# Patient Record
Sex: Male | Born: 1935 | Race: White | Hispanic: No | Smoking: Former smoker
Health system: Southern US, Community
[De-identification: ages and names within clinical notes are randomized; demographics above are authoritative.]

## PROBLEM LIST (undated history)

## (undated) DIAGNOSIS — I219 Acute myocardial infarction, unspecified: Secondary | ICD-10-CM

## (undated) DIAGNOSIS — M545 Low back pain, unspecified: Secondary | ICD-10-CM

## (undated) DIAGNOSIS — E039 Hypothyroidism, unspecified: Secondary | ICD-10-CM

## (undated) DIAGNOSIS — I509 Heart failure, unspecified: Secondary | ICD-10-CM

## (undated) DIAGNOSIS — K219 Gastro-esophageal reflux disease without esophagitis: Secondary | ICD-10-CM

## (undated) DIAGNOSIS — G4733 Obstructive sleep apnea (adult) (pediatric): Secondary | ICD-10-CM

## (undated) DIAGNOSIS — Z9989 Dependence on other enabling machines and devices: Secondary | ICD-10-CM

## (undated) DIAGNOSIS — I420 Dilated cardiomyopathy: Secondary | ICD-10-CM

## (undated) DIAGNOSIS — Z95810 Presence of automatic (implantable) cardiac defibrillator: Secondary | ICD-10-CM

## (undated) DIAGNOSIS — I472 Ventricular tachycardia, unspecified: Secondary | ICD-10-CM

## (undated) DIAGNOSIS — E785 Hyperlipidemia, unspecified: Secondary | ICD-10-CM

## (undated) DIAGNOSIS — I10 Essential (primary) hypertension: Secondary | ICD-10-CM

## (undated) DIAGNOSIS — Z87442 Personal history of urinary calculi: Secondary | ICD-10-CM

## (undated) DIAGNOSIS — C61 Malignant neoplasm of prostate: Secondary | ICD-10-CM

## (undated) DIAGNOSIS — G8929 Other chronic pain: Secondary | ICD-10-CM

## (undated) DIAGNOSIS — Z8719 Personal history of other diseases of the digestive system: Secondary | ICD-10-CM

## (undated) DIAGNOSIS — J45909 Unspecified asthma, uncomplicated: Secondary | ICD-10-CM

## (undated) DIAGNOSIS — T82110A Breakdown (mechanical) of cardiac electrode, initial encounter: Secondary | ICD-10-CM

## (undated) DIAGNOSIS — Z8711 Personal history of peptic ulcer disease: Secondary | ICD-10-CM

## (undated) DIAGNOSIS — J189 Pneumonia, unspecified organism: Secondary | ICD-10-CM

## (undated) DIAGNOSIS — I4891 Unspecified atrial fibrillation: Secondary | ICD-10-CM

## (undated) DIAGNOSIS — E0590 Thyrotoxicosis, unspecified without thyrotoxic crisis or storm: Secondary | ICD-10-CM

## (undated) DIAGNOSIS — C4490 Unspecified malignant neoplasm of skin, unspecified: Secondary | ICD-10-CM

## (undated) HISTORY — DX: Thyrotoxicosis, unspecified without thyrotoxic crisis or storm: E05.90

## (undated) HISTORY — PX: SKIN CANCER EXCISION: SHX779

## (undated) HISTORY — DX: Pneumonia, unspecified organism: J18.9

## (undated) HISTORY — DX: Essential (primary) hypertension: I10

## (undated) HISTORY — DX: Unspecified atrial fibrillation: I48.91

## (undated) HISTORY — DX: Ventricular tachycardia, unspecified: I47.20

## (undated) HISTORY — DX: Breakdown (mechanical) of cardiac electrode, initial encounter: T82.110A

## (undated) HISTORY — DX: Hyperlipidemia, unspecified: E78.5

## (undated) HISTORY — DX: Gastro-esophageal reflux disease without esophagitis: K21.9

## (undated) HISTORY — PX: PROSTATECTOMY: SHX69

## (undated) HISTORY — DX: Ventricular tachycardia: I47.2

## (undated) HISTORY — PX: APPENDECTOMY: SHX54

## (undated) HISTORY — PX: INGUINAL HERNIA REPAIR: SUR1180

## (undated) HISTORY — DX: Dilated cardiomyopathy: I42.0

---

## 2001-05-08 ENCOUNTER — Ambulatory Visit (HOSPITAL_COMMUNITY): Admission: RE | Admit: 2001-05-08 | Discharge: 2001-05-08 | Payer: Self-pay | Admitting: Internal Medicine

## 2001-05-08 ENCOUNTER — Encounter: Payer: Self-pay | Admitting: Internal Medicine

## 2007-07-18 HISTORY — PX: CARDIAC DEFIBRILLATOR PLACEMENT: SHX171

## 2010-06-29 ENCOUNTER — Encounter: Payer: Self-pay | Admitting: Internal Medicine

## 2010-07-07 ENCOUNTER — Encounter: Payer: Self-pay | Admitting: Critical Care Medicine

## 2010-10-29 DIAGNOSIS — Z95810 Presence of automatic (implantable) cardiac defibrillator: Secondary | ICD-10-CM

## 2010-10-29 HISTORY — DX: Presence of automatic (implantable) cardiac defibrillator: Z95.810

## 2010-12-21 DIAGNOSIS — E785 Hyperlipidemia, unspecified: Secondary | ICD-10-CM | POA: Insufficient documentation

## 2010-12-21 DIAGNOSIS — I10 Essential (primary) hypertension: Secondary | ICD-10-CM | POA: Insufficient documentation

## 2010-12-21 DIAGNOSIS — K219 Gastro-esophageal reflux disease without esophagitis: Secondary | ICD-10-CM | POA: Insufficient documentation

## 2010-12-21 DIAGNOSIS — J189 Pneumonia, unspecified organism: Secondary | ICD-10-CM | POA: Insufficient documentation

## 2010-12-22 ENCOUNTER — Ambulatory Visit
Admission: RE | Admit: 2010-12-22 | Discharge: 2010-12-22 | Payer: Self-pay | Source: Home / Self Care | Attending: Critical Care Medicine | Admitting: Critical Care Medicine

## 2010-12-22 ENCOUNTER — Telehealth: Payer: Self-pay | Admitting: Critical Care Medicine

## 2010-12-22 ENCOUNTER — Encounter: Payer: Self-pay | Admitting: Critical Care Medicine

## 2010-12-22 ENCOUNTER — Other Ambulatory Visit: Payer: Self-pay | Admitting: Critical Care Medicine

## 2010-12-22 DIAGNOSIS — G4733 Obstructive sleep apnea (adult) (pediatric): Secondary | ICD-10-CM | POA: Insufficient documentation

## 2010-12-22 DIAGNOSIS — K222 Esophageal obstruction: Secondary | ICD-10-CM | POA: Insufficient documentation

## 2010-12-22 DIAGNOSIS — I4891 Unspecified atrial fibrillation: Secondary | ICD-10-CM | POA: Insufficient documentation

## 2010-12-22 DIAGNOSIS — J441 Chronic obstructive pulmonary disease with (acute) exacerbation: Secondary | ICD-10-CM | POA: Insufficient documentation

## 2010-12-22 DIAGNOSIS — E0590 Thyrotoxicosis, unspecified without thyrotoxic crisis or storm: Secondary | ICD-10-CM | POA: Insufficient documentation

## 2010-12-22 DIAGNOSIS — Z8546 Personal history of malignant neoplasm of prostate: Secondary | ICD-10-CM | POA: Insufficient documentation

## 2010-12-22 LAB — CONVERTED CEMR LAB: IgE (Immunoglobulin E), Serum: 14 intl units/mL (ref 0.0–180.0)

## 2010-12-23 ENCOUNTER — Telehealth: Payer: Self-pay | Admitting: Critical Care Medicine

## 2010-12-23 LAB — T3, FREE: T3, Free: 2.7 pg/mL (ref 2.3–4.2)

## 2010-12-23 LAB — TSH: TSH: 1.38 u[IU]/mL (ref 0.35–5.50)

## 2010-12-23 LAB — T4, FREE: Free T4: 0.73 ng/dL (ref 0.60–1.60)

## 2010-12-24 ENCOUNTER — Telehealth: Payer: Self-pay | Admitting: Critical Care Medicine

## 2010-12-24 LAB — CONVERTED CEMR LAB: IgE (Immunoglobulin E), Serum: 12.5 intl units/mL (ref 0.0–180.0)

## 2011-01-14 NOTE — Progress Notes (Signed)
Summary: CXR  Phone Note Outgoing Call   Reason for Call: Discuss lab or test results Summary of Call: call pt and tell him CXR does not show pneumonia.  no active lung disease, no emphysema Initial call taken by: Storm Frisk MD,  December 22, 2010 1:55 PM  Follow-up for Phone Call        [facility] Crystal Yetta Barre RN  December 22, 2010 3:11 PM  pt returned crystal's call. Pt is aware of cxr and verbalized understanding and had no questions Carver Fila  December 22, 2010 4:36 PM

## 2011-01-14 NOTE — Progress Notes (Signed)
Summary: Food allergy panel negative  Phone Note Outgoing Call   Reason for Call: Discuss lab or test results Summary of Call: call pt and tell him his food allergy panel is negative, no food allergies detected Initial call taken by: Storm Frisk MD,  December 24, 2010 1:42 PM  Follow-up for Phone Call        called, spoke with pt.  He was informed of above results per PW and verbalized understanding Follow-up by: Gweneth Dimitri RN,  December 24, 2010 3:35 PM

## 2011-01-14 NOTE — Assessment & Plan Note (Signed)
Summary: Pulmonary Consultation   Copy to:  Dr. Gillis Ends Primary Provider/Referring Provider:  Dr. Desmond Dike  CC:  Pulmonary Consult - DOE/asthma..  History of Present Illness: Pulmonary Consultation This pt has had chronic dyspnea for years.  Noted Asthma dx at age 77. Pt has chronic cough prod white mucus.  Pt with dyspnea at exertion and at rest.    Asthma triggers:  smells from stores, leather, fumes, strong odors, certain foods:  Pt with freq sinus infection,  saw ENT 3months.  no xrays done.  has a goiter on R neck. thyroid R sided. Pt notesocc dysphagia,  chokes on water, had a swallow eval? results? done a [place] hosp. MBS three months ago>>>results show distal esophageal stricture with reflux. Pt never tested for allergies Pt with dx osa on cpap nightly,  setting 10cmh20 nasal mask Pt uses albuterol 1-2 times per day hfa Pt will use albuterol neb 2-3 times day Pt has atrial fibrillation. Dr  Dulce Sellar is cardiology, pt is on metoprolol 200mg  bid l for rate control          This is a 77 year old male who presents with asthma.  The patient complains of history of diagnosed Asthma, cough, shortness of breath, wheezing, mucous production, nocturnal awakening, exercise induced symptoms, and congestion, but denies chest tightness and chest pain.  Symptoms appear triggered by stress, cold exposure, exercise, URI, sinusitis, and irritant:.  The patient also has the following associated problems: chronic sinus disease, indigestion, difficulty swallowing, hoarseness, sneezing, nasal congestion, difficulty breathing through nose, productive cough, non-productive cough, and chest tightness.  Previous effective treatment includes short acting beta-agonist:.  Asthma monitoring and treatment delivery to date has included: use of home nebulizer and no PF meter.    Asthma History    Initial Asthma Severity Rating:    Age range: 12+ years    Symptoms: throughout the day    Nighttime  Awakenings: 3-4/month    Interferes w/ normal activity: some limitations    SABA use (not for EIB): several times per day    Exacerbations requiring oral systemic steroids: 2 or more/year    Asthma Severity Assessment: Severe Persistent   Preventive Screening-Counseling & Management  Alcohol-Tobacco     Smoking Status: quit > 6 months     Year Quit: 1984     Pack years: 40  Current Medications (verified): 1)  Coumadin 5 Mg Tabs (Warfarin Sodium) .... As Directed 2)  Digoxin 0.125 Mg Tabs (Digoxin) .... Take 1 Tablet By Mouth Once A Day 3)  Furosemide 40 Mg Tabs (Furosemide) .... Take 1 Tablet By Mouth Once A Day 4)  Klor-Con M20 20 Meq Cr-Tabs (Potassium Chloride Crys Cr) .... Take 1 Tablet By Mouth Two Times A Day 5)  Methimazole 10 Mg Tabs (Methimazole) .... 2 Tablets Once Daily 6)  Metoprolol Tartrate 100 Mg Tabs (Metoprolol Tartrate) .... Take 2 Tablets By Mouth in Am and 2 Tablets By Mouth in Pm 7)  Pravastatin Sodium 40 Mg Tabs (Pravastatin Sodium) .... Take 1 Tablet By Mouth Once A Day 8)  Zolpidem Tartrate 10 Mg Tabs (Zolpidem Tartrate) .... Take 1 Tab By Mouth At Bedtime 9)  Fish Oil 1200 Mg Caps (Omega-3 Fatty Acids) .... Take 1 Capsule By Mouth At Bedtime 10)  Spironolactone 25 Mg Tabs (Spironolactone) .... Take 1 Tablet By Mouth Once A Day 11)  Proair Hfa 108 (90 Base) Mcg/act Aers (Albuterol Sulfate) .... 2 Puffs Every 4 Hours As Needed 12)  Albuterol Sulfate (  2.5 Mg/74ml) 0.083% Nebu (Albuterol Sulfate) .... As Needed 13)  Cpap .... At Bedtime  Allergies (verified): 1)  ! Pcn 2)  ! * Dobutamine  Past History:  Past medical, surgical, family and social histories (including risk factors) reviewed, and no changes noted (except as noted below).  Past Medical History: PROSTATE CANCER, HX OF (ICD-V10.46) SLEEP APNEA, OBSTRUCTIVE (ICD-327.23)    -Cpap 10cmh20 PNEUMONIA (ICD-486)    10/29/10  Rocephin and levaquin     -no CXR since GERD (ICD-530.81) HYPERLIPIDEMIA  (ICD-272.4) HYPERTENSION (ICD-401.9) Atrial fibrillation    -chronic coumadin  Past Surgical History: Prostatectomy Pacemaker      Family History: Reviewed history and no changes required. mother - heart disease father - bladder cancer  Social History: Reviewed history and no changes required. Former Smoker.  Quit in 1984.  Started at age 46.  Up to 2ppd when he quit.  Occas chewing tobacco.  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  The patient denies shortness of breath at rest, non-productive cough, coughing up blood, chest pain, acid heartburn, indigestion, loss of appetite, weight change, abdominal pain, sore throat, tooth/dental problems, nasal congestion/difficulty breathing through nose, itching, ear ache, anxiety, depression, hand/feet swelling, joint stiffness or pain, rash, change in color of mucus, and fever.        See HPI for Pulmonary, General, ENT, and Cardiac review of systems.  Vital Signs:  Patient profile:   77 year old male Height:      68 inches Weight:      243.25 pounds BMI:     37.12 O2 Sat:      95 % on Room air Temp:     97.8 degrees F oral Pulse rate:   71 / minute BP sitting:   122 / 72  (left arm) Cuff size:   large  Vitals Entered By: Gweneth Dimitri RN (December 22, 2010 11:08 AM)  O2 Flow:  Room air  Serial Vital Signs/Assessments:  Comments: 11:39 AM Ambulatory Pulse Oximetry  Resting; HR__70___    02 Sat__95% RA___  Lap1 (185 feet)   HR__95   02 Sat__95% RA___ Lap2 (185 feet)   HR__98___   02 Sat_94% RA____    Lap3 (185 feet)   HR__100___   02 Sat__96% RA___  _x__Test Completed without Difficulty ___Test Stopped due to:  Gweneth Dimitri RN  December 22, 2010 11:39 AM  By: Gweneth Dimitri RN   CC: Pulmonary Consult  - DOE/asthma. Comments Medications reviewed with patient Daytime contact number verified with patient. Crystal Jones RN  December 22, 2010 11:08 AM    Physical Exam  Additional Exam:  Gen: Pleasant, well-nourished, in no distress,  normal affect ENT: No lesions,  mouth clear,  oropharynx clear, no postnasal drip Neck: No JVD, no TMG, no carotid bruits Lungs: No use of accessory muscles, no dullness to percussion, distant BS Cardiovascular: RRR, heart sounds normal, no murmur or gallops, no peripheral edema Abdomen: soft and NT, no HSM,  BS normal Musculoskeletal: No deformities, no cyanosis or clubbing Neuro: alert, non focal Skin: Warm, no lesions or rashes    CXR  Procedure date:  12/22/2010  Findings:      Findings: There is a left chest wall pacer device with leads in the right atrial appendage and right  ventricle.   The heart size appears mildly enlarged.   No pleural effusions or edema noted.  There is pulmonary venous congestion present.   Review of the visualized osseous structures is unremarkable.   IMPRESSION: Cardiac enlargement and pulmonary venous congestion.     Impression & Recommendations:  Problem # 1:  EXTRINSIC ASTHMA, UNSPECIFIED (ICD-493.00) Assessment Deteriorated Severe persistent asthma with moderate obstruction due to atopy, reflux, esophageal stricture, high dose beta blocker use , severe airflow obstruction on spiro plan  Prednisone 10mg  Take 4 daily for two days, then 3 daily for two days, then two daily for two days then one daily for two days then stop Reduce metoprolol to 100mg  two times a day  (1 twice daily) Labs today Try to reduce albuterol in nebulizer and inhaler to as needed Labs: check RAST assay An overnight sleep oximetry on CPAP will be obtained cont advair  Chest xray today>>>no active disease Full pulmonary functions on return A GI referral will be made for esophageal stricture Return 1 month   Medications Added to  Medication List This Visit: 1)  Furosemide 40 Mg Tabs (Furosemide) .... Take 1 tablet by mouth once a day 2)  Klor-con M20 20 Meq Cr-tabs (Potassium chloride crys cr) .... Take 1 tablet by mouth two times a day 3)  Methimazole 10 Mg Tabs (Methimazole) .... 2 tablets once daily 4)  Metoprolol Tartrate 100 Mg Tabs (Metoprolol tartrate) .... Take 2 tablets by mouth in am and 2 tablets by mouth in pm 5)  Metoprolol Tartrate 100 Mg Tabs (Metoprolol tartrate) .... Take 1  tablets by mouth in am and 1tablets by mouth in pm 6)  Zolpidem Tartrate 10 Mg Tabs (Zolpidem tartrate) .... Take 1 tab by mouth at bedtime 7)  Fish Oil 1200 Mg Caps (Omega-3 fatty acids) .... Take 1 capsule by mouth at bedtime 8)  Fish Oil 1200 Mg Caps (Omega-3 fatty acids) .... Hold 9)  Spironolactone 25 Mg Tabs (Spironolactone) .... Take 1 tablet by mouth once a day 10)  Proair Hfa 108 (90 Base) Mcg/act Aers (Albuterol sulfate) .... 2 puffs every 4 hours as needed 11)  Albuterol Sulfate (2.5 Mg/52ml) 0.083% Nebu (Albuterol sulfate) .... As needed 12)  Cpap  .... At bedtime  10cmh20 13)  Cpap  .... At bedtime 14)  Advair Diskus 250-50 Mcg/dose Misc (Fluticasone-salmeterol) .... One puff twice daily 15)  Prednisone 10 Mg Tabs (Prednisone) .... Take as directed take 4 daily for two days, then 3 daily for two days, then two daily for two days then one daily for two days then stop  Complete Medication List: 1)  Coumadin 5 Mg Tabs (Warfarin sodium) .... As directed 2)  Digoxin 0.125 Mg Tabs (Digoxin) .... Take 1 tablet by mouth once a day 3)  Furosemide 40 Mg Tabs (Furosemide) .... Take 1 tablet by mouth once a day 4)  Klor-con M20 20 Meq Cr-tabs (Potassium chloride crys cr) .... Take 1 tablet by mouth two times a day 5)  Methimazole 10 Mg Tabs (Methimazole) .... 2 tablets once daily 6)  Metoprolol Tartrate 100 Mg Tabs (Metoprolol tartrate) .... Take 1  tablets by mouth in am and 1tablets by mouth in pm 7)  Pravastatin Sodium 40 Mg  Tabs (Pravastatin sodium) .... Take 1 tablet by mouth once a day 8)  Zolpidem Tartrate 10 Mg Tabs (Zolpidem tartrate) .... Take 1 tab by mouth at bedtime 9)  Fish Oil 1200 Mg Caps (Omega-3 fatty acids) .... Hold 10)  Spironolactone  25 Mg Tabs (Spironolactone) .... Take 1 tablet by mouth once a day 11)  Proair Hfa 108 (90 Base) Mcg/act Aers (Albuterol sulfate) .... 2 puffs every 4 hours as needed 12)  Albuterol Sulfate (2.5 Mg/41ml) 0.083% Nebu (Albuterol sulfate) .... As needed 13)  Cpap  .... At bedtime  10cmh20 14)  Advair Diskus 250-50 Mcg/dose Misc (Fluticasone-salmeterol) .... One puff twice daily 15)  Prednisone 10 Mg Tabs (Prednisone) .... Take as directed take 4 daily for two days, then 3 daily for two days, then two daily for two days then one daily for two days then stop  Other Orders: New Patient Level V (69629) Pulse Oximetry, Ambulatory (52841) Spirometry w/Graph (94010) T-"RAST" (Allergy Full Profile) IGE (32440-10272) T-Food Allergy Profile Specific IgE (86003/[address]) DME Referral (DME) Pulmonary Referral (Pulmonary) T-2 View CXR (71020TC) Gastroenterology Referral (GI) TLB-TSH (Thyroid Stimulating Hormone) (84443-TSH) TLB-T4 (Thyrox), Free [phone]) TLB-T3, Free (Triiodothyronine) (84481-T3FREE)  Patient Instructions: 1)  Prednisone 10mg  Take 4 daily for two days, then 3 daily for two days, then two daily for two days then one daily for two days then stop 2)  Reduce metoprolol to 100mg  two times a day  (1 twice daily) 3)  Labs today 4)  Try to reduce albuterol in nebulizer and inhaler to as needed 5)  Labs today 6)  An overnight sleep oximetry on CPAP will be obtained 7)  Chest xray today 8)  Full pulmonary functions on return 9)  A GI referral will be made for esophageal stricture 10)  Return 1 month  Prescriptions: PREDNISONE 10 MG  TABS (PREDNISONE) Take as directed Take 4 daily for two days, then 3 daily for two days, then two daily for two days then one  daily for two days then stop  #20 x 0   Entered and Authorized by:   Storm Frisk MD   Signed by:   Storm Frisk MD on 12/22/2010   Method used:   Electronically to        [facility]  [address] [id number] (retail)       [address]       [place], [place]  [address]       Ph: [phone] or [phone]       Fax: [phone]   RxID:   [id number] ADVAIR DISKUS 250-50 MCG/DOSE  MISC (FLUTICASONE-SALMETEROL) One puff twice daily  #1 x 6   Entered and Authorized by:   Storm Frisk MD   Signed by:   Storm Frisk MD on 12/22/2010   Method used:   Electronically to        [facility]  [address] [id number] (retail)       [address]       [place], [place]  [address]       Ph: [phone] or [phone]       Fax: [phone]   RxID:   [id number]    Immunization History:  Influenza Immunization History:    Influenza:  historical (09/12/2010)   Appended Document: Pulmonary Consultation fax Desmond Dike

## 2011-01-14 NOTE — Progress Notes (Signed)
Summary: Thyroid labs normal  Phone Note Outgoing Call   Reason for Call: Discuss lab or test results Summary of Call: call pt and tell him his thyroid function labs were normal Initial call taken by: Storm Frisk MD,  December 23, 2010 3:47 PM  Follow-up for Phone Call        done  Follow-up by: Storm Frisk MD,  December 24, 2010 9:39 AM

## 2011-01-21 ENCOUNTER — Encounter: Payer: Self-pay | Admitting: Critical Care Medicine

## 2011-01-26 ENCOUNTER — Encounter: Payer: Self-pay | Admitting: Critical Care Medicine

## 2011-01-26 ENCOUNTER — Telehealth: Payer: Self-pay | Admitting: Critical Care Medicine

## 2011-01-26 ENCOUNTER — Ambulatory Visit (INDEPENDENT_AMBULATORY_CARE_PROVIDER_SITE_OTHER): Payer: Medicare Other | Admitting: Critical Care Medicine

## 2011-01-26 DIAGNOSIS — J449 Chronic obstructive pulmonary disease, unspecified: Secondary | ICD-10-CM

## 2011-02-03 NOTE — Assessment & Plan Note (Signed)
Summary: Pulmonary OV   Copy to:  Dr. Gillis Ends Primary Provider/Referring Provider:  Dr. Desmond Dike  CC:  1 month follow up.  Pt states he does have SOB with exertion but overall breathing has improved.  Prod cough with white mucus.  Denies wheezing and chest tightness.Cody Gomez  History of Present Illness: Pulmonary Consultation This pt has had chronic dyspnea for years.  Noted Asthma dx at age 77. Pt has chronic cough prod white mucus.  Pt with dyspnea at exertion and at rest.    Asthma triggers:  smells from stores, leather, fumes, strong odors, certain foods:  Pt with freq sinus infection,  saw ENT 3months.  no xrays done.  has a goiter on R neck. thyroid R sided. Pt notesocc dysphagia,  chokes on water, had a swallow eval? results? done a [place] hosp. MBS three months ago>>>results show distal esophageal stricture with reflux. Pt never tested for allergies Pt with dx osa on cpap nightly,  setting 10cmh20 nasal mask Pt uses albuterol 1-2 times per day hfa Pt will use albuterol neb 2-3 times day Pt has atrial fibrillation. Dr  Dulce Sellar is cardiology, pt is on metoprolol 200mg  bid l for rate control          This is a 77 year old male who presents with asthma.  The patient complains of history of diagnosed Asthma, cough, shortness of breath, wheezing, mucous production, nocturnal awakening, exercise induced symptoms, and congestion, but denies chest tightness and chest pain.  Symptoms appear triggered by stress, cold exposure, exercise, URI, sinusitis, and irritant:.  The patient also has the following associated problems: chronic sinus disease, indigestion, difficulty swallowing, hoarseness, sneezing, nasal congestion, difficulty breathing through nose, productive cough, non-productive cough, and chest tightness.  Previous effective treatment includes short acting beta-agonist:.  Asthma monitoring and treatment delivery to date has included: use of home nebulizer and no PF meter.  January 26, 2011 4:55 PM Since last ov pt is improved with less dyspnea.  Pt has esoph stricture due to be stretched in [place] soon.   Pt had a cpap titration study, results pending in [facility] lab.  ONO on RA wiht cpap did not demonstrate any oxygen need. No new issues. Pt denies any significant sore throat, nasal congestion or excess secretions, fever, chills, sweats, unintended weight loss, pleurtic or exertional chest pain, orthopnea PND, or leg swelling Pt denies any increase in rescue therapy over baseline, denies waking up needing it or having any early am or nocturnal exacerbations of coughing/wheezing/or dyspnea.   Current Medications (verified): 1)  Coumadin 5 Mg Tabs (Warfarin Sodium) .... As Directed 2)  Digoxin 0.125 Mg Tabs (Digoxin) .... Take 1 Tablet By Mouth Once A Day 3)  Furosemide 40 Mg Tabs (Furosemide) .... Take 1 Tablet By Mouth Once A Day 4)  Klor-Con M20 20 Meq Cr-Tabs (Potassium Chloride Crys Cr) .... Take 1 Tablet By Mouth Two Times A Day 5)  Methimazole 10 Mg Tabs (Methimazole) .... 2 Tablets Once Daily 6)  Metoprolol Tartrate 100 Mg Tabs (Metoprolol Tartrate) .... Take 1  Tablets By Mouth in Am and 1tablets By Mouth in Pm 7)  Pravastatin Sodium 40 Mg Tabs (Pravastatin Sodium) .... Take 1 Tablet By Mouth Once A Day 8)  Zolpidem Tartrate 10 Mg Tabs (Zolpidem Tartrate) .... Take 1 Tab By Mouth At Bedtime 9)  Fish Oil 1200 Mg Caps (Omega-3 Fatty Acids) .... Hold 10)  Spironolactone 25 Mg Tabs (Spironolactone) .... Take 1 Tablet By Mouth Once A  Day 11)  Proair Hfa 108 (90 Base) Mcg/act Aers (Albuterol Sulfate) .... 2 Puffs Every 4 Hours As Needed 12)  Albuterol Sulfate (2.5 Mg/31ml) 0.083% Nebu (Albuterol Sulfate) .... As Needed 13)  Cpap .... At Bedtime  10cmh20 14)  Advair Diskus 250-50 Mcg/dose  Misc (Fluticasone-Salmeterol) .... One Puff Twice Daily  Allergies (verified): 1)  ! Pcn 2)  ! * Dobutamine  Past History:  Past medical, surgical, family and social histories  (including risk factors) reviewed, and no changes noted (except as noted below).  Past Medical History: Reviewed history from 12/22/2010 and no changes required. PROSTATE CANCER, HX OF (ICD-V10.46) SLEEP APNEA, OBSTRUCTIVE (ICD-327.23)    -Cpap 10cmh20 PNEUMONIA (ICD-486)    10/29/10  Rocephin and levaquin     -no CXR since GERD (ICD-530.81) HYPERLIPIDEMIA (ICD-272.4) HYPERTENSION (ICD-401.9) Atrial fibrillation    -chronic coumadin  Past Surgical History: Reviewed history from 12/22/2010 and no changes required. Prostatectomy Pacemaker      Family History: Reviewed history from 12/22/2010 and no changes required. mother - heart disease father - bladder cancer  Social History: Reviewed history from 12/22/2010 and no changes required. Former Smoker.  Quit in 1984.  Started at age 75.  Up to 2ppd when he quit.  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  The patient denies shortness of breath at rest, productive cough, non-productive cough, coughing up blood, chest pain, irregular heartbeats, acid heartburn, indigestion, loss of appetite, weight change, abdominal pain, difficulty swallowing, sore throat, tooth/dental problems, headaches, nasal congestion/difficulty breathing through nose, sneezing, itching, ear ache, anxiety, depression, hand/feet swelling, joint stiffness or pain, rash, change in color of mucus, and fever.    Vital Signs:  Patient profile:   77 year old male Height:      67 inches Weight:      236.50 pounds BMI:     37.18 O2 Sat:      95 % on Room air Temp:     97.9 degrees F oral Pulse rate:   75 / minute BP sitting:   122 / 78  (right arm) Cuff size:   large  Vitals Entered By: Gweneth Dimitri RN (January 26, 2011 4:41 PM)  O2 Flow:  Room air CC: 1 month follow up.  Pt states he does have SOB with exertion but overall breathing has improved.  Prod  cough with white mucus.  Denies wheezing and chest tightness. Comments Medications reviewed with patient Daytime contact number verified with patient. Gweneth Dimitri RN  January 26, 2011 4:43 PM    Physical Exam  Additional Exam:  Gen: Pleasant, well-nourished, in no distress,  normal affect ENT: No lesions,  mouth clear,  oropharynx clear, no postnasal drip Neck: No JVD, no TMG, no carotid bruits Lungs: No use of accessory muscles, no dullness to percussion, distant BS Cardiovascular: RRR, heart sounds normal, no murmur or gallops, no peripheral edema Abdomen: soft and NT, no HSM,  BS normal Musculoskeletal: No deformities, no cyanosis or clubbing Neuro: alert, non focal Skin: Warm, no lesions or rashes  The following is not an MRI, but is a CXR exam :  Sleep Study  Procedure date:  01/21/2011  Findings:      Low Oxygen Sat: 85%  Oximetry overnight on   RA plus cpap               =  min  time with sats less than 89%   MRI EXAM  Procedure date:  12/22/2010  Findings:      IMPRESSION: Cardiac enlargement and pulmonary venous congestion.    Impression & Recommendations:  Problem # 1:  COPD (ICD-496) Assessment Improved Stable copd plan NO oxygen needed with cpap Stay on advair No further prednisone needed Return 4 months  Problem # 2:  SLEEP APNEA, OBSTRUCTIVE (ICD-327.23) Assessment: Unchanged f/u recent cpap titration study from [place] note recent ONO on RA plus cpap showed no need for oxygen supp to cpap  Complete Medication List: 1)  Coumadin 5 Mg Tabs (Warfarin sodium) .... As directed 2)  Digoxin 0.125 Mg Tabs (Digoxin) .... Take 1 tablet by mouth once a day 3)  Furosemide 40 Mg Tabs (Furosemide) .... Take 1 tablet by mouth once a day 4)  Klor-con M20 20 Meq Cr-tabs (Potassium chloride crys cr) .... Take 1 tablet by mouth two times a day 5)  Methimazole 10 Mg Tabs (Methimazole) .... 2 tablets once daily 6)  Metoprolol Tartrate 100 Mg Tabs (Metoprolol  tartrate) .... Take 1  tablets by mouth in am and 1tablets by mouth in pm 7)  Pravastatin Sodium 40 Mg Tabs (Pravastatin sodium) .... Take 1 tablet by mouth once a day 8)  Zolpidem Tartrate 10 Mg Tabs (Zolpidem tartrate) .... Take 1 tab by mouth at bedtime 9)  Fish Oil 1200 Mg Caps (Omega-3 fatty acids) .... Hold 10)  Spironolactone 25 Mg Tabs (Spironolactone) .... Take 1 tablet by mouth once a day 11)  Proair Hfa 108 (90 Base) Mcg/act Aers (Albuterol sulfate) .... 2 puffs every 4 hours as needed 12)  Albuterol Sulfate (2.5 Mg/39ml) 0.083% Nebu (Albuterol sulfate) .... As needed 13)  Cpap  .... At bedtime  10cmh20 14)  Advair Diskus 250-50 Mcg/dose Misc (Fluticasone-salmeterol) .... One puff twice daily  Other Orders: Est. Patient Level III ([address])  Clinical Reports Reviewed:  PFT's:  12/22/2010: FEF 25/75 %Predicted:  54.386 FEV1 %Predicted:  66.677 FEV1/FVC %Predicted:  92.141 FVC %Predicted:  71.790   Patient Instructions: 1)  NO oxygen needed with cpap 2)  Stay on advair 3)  No further prednisone needed 4)  Return 4 months 5)  We will review results from [facility] lab 6)  Return 1 month     Appended Document: Pulmonary OV fax Desmond Dike

## 2011-02-03 NOTE — Miscellaneous (Signed)
Summary: ONO plus cpap  Clinical Lists Changes

## 2011-02-03 NOTE — Progress Notes (Signed)
Summary: ONO results  Phone Note Outgoing Call   Reason for Call: Discuss lab or test results Summary of Call: call pt and tell him overnight sleep oximetry indicates he does NOT need oxygen with his cpap machine Initial call taken by: Storm Frisk MD,  January 26, 2011 2:26 PM  Follow-up for Phone Call        Called, spoke with pt's wife.  Pt unavailable at this time but she will have pt return call. Gweneth Dimitri RN  January 26, 2011 3:08 PM  Pt returned call.  He was informed of above ONO results and recs per PW.  He verbalized understanding of this. Follow-up by: Gweneth Dimitri RN,  January 26, 2011 3:20 PM

## 2011-02-03 NOTE — Procedures (Signed)
Summary: Pulse Oximetry/IDS  Pulse Oximetry/IDS   Imported By: Sherian Rein 01/29/2011 08:34:16  _____________________________________________________________________  External Attachment:    Type:   Image     Comment:   External Document

## 2011-06-23 ENCOUNTER — Encounter: Payer: Self-pay | Admitting: Internal Medicine

## 2011-06-24 ENCOUNTER — Ambulatory Visit (INDEPENDENT_AMBULATORY_CARE_PROVIDER_SITE_OTHER): Payer: Medicare Other | Admitting: Internal Medicine

## 2011-06-24 ENCOUNTER — Encounter (INDEPENDENT_AMBULATORY_CARE_PROVIDER_SITE_OTHER): Payer: Medicare Other | Admitting: *Deleted

## 2011-06-24 ENCOUNTER — Other Ambulatory Visit: Payer: Self-pay | Admitting: Internal Medicine

## 2011-06-24 ENCOUNTER — Encounter: Payer: Self-pay | Admitting: Internal Medicine

## 2011-06-24 VITALS — BP 110/74 | HR 78 | Ht 68.0 in | Wt 203.0 lb

## 2011-06-24 DIAGNOSIS — I472 Ventricular tachycardia: Secondary | ICD-10-CM

## 2011-06-24 DIAGNOSIS — Z95810 Presence of automatic (implantable) cardiac defibrillator: Secondary | ICD-10-CM

## 2011-06-24 DIAGNOSIS — I428 Other cardiomyopathies: Secondary | ICD-10-CM

## 2011-06-24 DIAGNOSIS — I4891 Unspecified atrial fibrillation: Secondary | ICD-10-CM

## 2011-06-24 NOTE — Patient Instructions (Addendum)
Lab today-BMP/Liver profile/CBC/ Magnesium level 427.31  Call next week to let us know what day you want to be admitted for Tikosyn. We need  2 days notice to schedule this admission.  Call Weston Brass, pharmacist  [phone].   Follow-up with Dr Graciela Husbands will be scheduled when you are discharged from the hospital.

## 2011-06-24 NOTE — Progress Notes (Signed)
HPI: Cody Gomez is a 77 y.o. male Is seen at the request of Dr. Sheria Lang her second opinions regarding management of arrhythmia.  The patient does not understand why he has a defibrillator implanted. This was done in 2009 or so. He understands that was done for atrial fibrillation. He appreciates that he has no ischemic heart disease.  Because of arrhythmia issues he was treated with amiodarone that were discontinued because of thyroid issues. A note dated June 2012 says that he was on dronaderone at that time. With review of the pharmacy this prescription has not been refilled since October. Misty Stanley one occasion it sounds like he was cardioverted via his ICD as he recalls being shocked while he was awake.    The patient has had multiple ICD discharges. Most recent assessment of left ventricular function with an echo in May 2012 demonstrated ejection fraction of 45% with left ventricular enlargement.  He is functionally quite limited. He has class 2-3 symptoms. He does not have edema however orthopnea or nocturnal dyspnea. He has not had syncope or palpitations  He has been followed by [facility]. We have a note from May 2012 outlining that he was to be sent to.. When this happened he was referred for catheter ablation. It was at this point that he began to have questions regarding his care and sought other input.  Current Outpatient Prescriptions  Medication Sig Dispense Refill  . albuterol (PROAIR HFA) 108 (90 BASE) MCG/ACT inhaler Inhale 2 puffs into the lungs every 4 (four) hours as needed.        Marland Kitchen albuterol (PROVENTIL) (2.5 MG/3ML) 0.083% nebulizer solution Take 2.5 mg by nebulization every 6 (six) hours as needed.        . digoxin (LANOXIN) 0.125 MG tablet Take 125 mcg by mouth daily.        . Fluticasone-Salmeterol (ADVAIR DISKUS) 250-50 MCG/DOSE AEPB Inhale 1 puff into the lungs 2 (two) times daily.        . furosemide (LASIX) 40 MG tablet Take 40 mg by mouth daily.        .  Magnesium Chloride (SLOW-MAG PO) Take 71.5 mg by mouth 2 (two) times daily.        . methimazole (TAPAZOLE) 10 MG tablet Take 10 mg by mouth daily.        . metoprolol (LOPRESSOR) 100 MG tablet Take 100 mg by mouth 2 (two) times daily.       . NON FORMULARY CPAP - at bedtime 10cmh20       . omeprazole (PRILOSEC) 20 MG capsule Take 20 mg by mouth daily.        . potassium chloride SA (K-DUR,KLOR-CON) 20 MEQ tablet Take 20 mEq by mouth 2 (two) times daily.        . pravastatin (PRAVACHOL) 40 MG tablet Take 40 mg by mouth daily.        Marland Kitchen spironolactone (ALDACTONE) 25 MG tablet Take 25 mg by mouth daily.        Marland Kitchen warfarin (COUMADIN) 5 MG tablet Take 5 mg by mouth as directed.          Allergies  Allergen Reactions  . Penicillins     REACTION: swelling    Past Medical History  Diagnosis Date  . History of prostate cancer   . OSA (obstructive sleep apnea)     Cpap 10cmh20  . Pneumonia 10/29/10    rocephin and levaquin; no CXR since  . GERD (gastroesophageal reflux disease)   .  HLD (hyperlipidemia)   . HTN (hypertension)   . Atrial fibrillation     chronic coumadin     Past Surgical History  Procedure Date  . Prostatectomy   . Pacemaker insertion     medtronic  . Appendectomy   . Hernia repair     Family History  Problem Relation Age of Onset  . Heart disease Mother   . Cancer Father     bladder     History   Social History  . Marital Status: Single    Spouse Name: N/A    Number of Children: N/A  . Years of Education: N/A   Occupational History  . Not on file.   Social History Main Topics  . Smoking status: Former Games developer  . Smokeless tobacco: Not on file   Comment: started when 14; quit in 1984, up to 2 ppd. occassionally chews tobacco    . Alcohol Use: Not on file  . Drug Use: Not on file  . Sexually Active: Not on file   Other Topics Concern  . Not on file   Social History Narrative   Married, 1 son; retired Naval architect.     Fourteen point review  of systems was negative except as noted in HPI and PMH   PHYSICAL EXAMINATION  Blood pressure 110/74, pulse 78, height 5\' 8"  (1.727 m), weight 203 lb (92.08 kg).   Well developed and nourished Older Caucasian male appearing his stated age in no acute distress HENT normal Neck supple with JVP-7-8 cm Carotids brisk and full without bruits Back without scoliosis or kyphosis Clear Regular rate and rhythm,S4 and a 2/6 murmur is heard along the left lower sternal border Abd-soft with active BS without hepatomegaly or midline pulsation Femoral pulses 2+ distal pulses intact No Clubbing cyanosis 10 trace edema Skin-warm and dry LN-neg submandibular and supraclavicular A & Oriented CN 3-12 normal  Grossly normal sensory and motor function Affect engaging . Electrocardiogram demonstrates right bundle branch block with left anterior fascicular block and first degree AV block

## 2011-06-25 ENCOUNTER — Encounter: Payer: Self-pay | Admitting: Internal Medicine

## 2011-06-25 DIAGNOSIS — Z95810 Presence of automatic (implantable) cardiac defibrillator: Secondary | ICD-10-CM | POA: Insufficient documentation

## 2011-06-25 DIAGNOSIS — I472 Ventricular tachycardia: Secondary | ICD-10-CM | POA: Insufficient documentation

## 2011-06-25 DIAGNOSIS — I428 Other cardiomyopathies: Secondary | ICD-10-CM | POA: Insufficient documentation

## 2011-06-25 LAB — HEPATIC FUNCTION PANEL
ALT: 14 U/L (ref 0–53)
Alkaline Phosphatase: 80 U/L (ref 39–117)
Bilirubin, Direct: 0.1 mg/dL (ref 0.0–0.3)
Total Protein: 8.4 g/dL — ABNORMAL HIGH (ref 6.0–8.3)

## 2011-06-25 LAB — CBC WITH DIFFERENTIAL/PLATELET
Basophils Absolute: 0.1 10*3/uL (ref 0.0–0.1)
Lymphocytes Relative: 27.2 % (ref 12.0–46.0)
Monocytes Relative: 9.9 % (ref 3.0–12.0)
Neutrophils Relative %: 53.8 % (ref 43.0–77.0)
Platelets: 309 10*3/uL (ref 150.0–400.0)
RDW: 14.3 % (ref 11.5–14.6)

## 2011-06-25 LAB — BASIC METABOLIC PANEL
CO2: 28 mEq/L (ref 19–32)
Calcium: 9.7 mg/dL (ref 8.4–10.5)
Chloride: 103 mEq/L (ref 96–112)
Sodium: 139 mEq/L (ref 135–145)

## 2011-06-25 NOTE — Assessment & Plan Note (Signed)
The patient's device was interrogated.  The information was reviewed. No changes were made in the programming.    

## 2011-06-25 NOTE — Assessment & Plan Note (Signed)
The patient has rapid atrial fibrillation resulting in inappropriate ICD discharges. Catheter ablation as an option. Given the fact that he has significant ventricular arrhythmias however, drug suppression may allow for control of both. To that end we will begin Tikosyn.

## 2011-06-25 NOTE — Assessment & Plan Note (Signed)
Continue current medications. I wonder why he is not on an ACE inhibitor. We will need to obtain prior records. 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

## 2011-06-25 NOTE — Assessment & Plan Note (Signed)
The patient has both slow and fast ventricular tachycardia with appropriate ICD therapy. Options would include ablation and/or antiarrhythmic drug suppression. Given the multitude of ventricular arrhythmias and atrial arrhythmias, I would elect to pursue drug therapy. To that end we'll anticipate admission to hospital and initiation of Tikosyn. Risks benefits and alternatives have been reviewed.

## 2011-06-30 ENCOUNTER — Telehealth: Payer: Self-pay | Admitting: Internal Medicine

## 2011-06-30 ENCOUNTER — Ambulatory Visit (INDEPENDENT_AMBULATORY_CARE_PROVIDER_SITE_OTHER): Payer: Medicare Other

## 2011-06-30 VITALS — BP 110/60 | Ht 68.0 in | Wt 206.5 lb

## 2011-06-30 DIAGNOSIS — I4891 Unspecified atrial fibrillation: Secondary | ICD-10-CM

## 2011-06-30 LAB — PROTIME-INR: Prothrombin Time: 21.9 seconds — ABNORMAL HIGH (ref 11.6–15.2)

## 2011-06-30 LAB — BASIC METABOLIC PANEL
BUN: 19 mg/dL (ref 6–23)
Glucose, Bld: 103 mg/dL — ABNORMAL HIGH (ref 70–99)
Potassium: 4.1 mEq/L (ref 3.5–5.3)

## 2011-06-30 LAB — MAGNESIUM: Magnesium: 2.3 mg/dL (ref 1.5–2.5)

## 2011-06-30 NOTE — Progress Notes (Signed)
HPI  Pt evaluated for Tikosyn admission today.  Recently evaluated by Dr. Graciela Husbands and decided to control arrhythmias with drug therapy.  Per primary cardiology notes, pt was on Multaq previously but has not had this filled at the pharmacy since October.  Reviewed pt's medication list.  He is on no contraindicated medications or meds that are known to increase QTc.  He does have an ICD.    Pt is on Coumadin for anticoagulation.  This is followed by his primary cardiologist.  Reviewed records.  His INRs have been therapeutic for the past 3 months.  Will recheck INR today.    Discussed importance of compliance and potential side effects with patient and wife.  Called his prescription insurance company.  Tikosyn does not require a PA and his copay will be $74/month.   Current Outpatient Prescriptions on File Prior to Visit  Medication Sig Dispense Refill  . albuterol (PROAIR HFA) 108 (90 BASE) MCG/ACT inhaler Inhale 2 puffs into the lungs every 4 (four) hours as needed.        Marland Kitchen albuterol (PROVENTIL) (2.5 MG/3ML) 0.083% nebulizer solution Take 2.5 mg by nebulization every 6 (six) hours as needed.        . digoxin (LANOXIN) 0.125 MG tablet Take 125 mcg by mouth daily.        . Fluticasone-Salmeterol (ADVAIR DISKUS) 250-50 MCG/DOSE AEPB Inhale 1 puff into the lungs 2 (two) times daily.        . furosemide (LASIX) 40 MG tablet Take 40 mg by mouth daily.        . Magnesium Chloride (SLOW-MAG PO) Take 71.5 mg by mouth 2 (two) times daily.        . methimazole (TAPAZOLE) 10 MG tablet Take 10 mg by mouth daily.        . metoprolol (LOPRESSOR) 100 MG tablet Take 100 mg by mouth 2 (two) times daily.       . NON FORMULARY CPAP - at bedtime 10cmh20       . omeprazole (PRILOSEC) 20 MG capsule Take 20 mg by mouth daily.        . potassium chloride SA (K-DUR,KLOR-CON) 20 MEQ tablet Take 20 mEq by mouth 2 (two) times daily.        . pravastatin (PRAVACHOL) 40 MG tablet Take 40 mg by mouth daily.        Marland Kitchen  spironolactone (ALDACTONE) 25 MG tablet Take 25 mg by mouth daily.        Marland Kitchen warfarin (COUMADIN) 5 MG tablet Take 5 mg by mouth as directed.         Allergies  Allergen Reactions  . Penicillins     REACTION: swelling

## 2011-06-30 NOTE — Assessment & Plan Note (Signed)
Pt's labs drawn today.  K, Mg and dig are all appropriate for starting Tikosyn, but INR is subtherapeutic at 1.8.  Spoke with pt.  He is going to take an extra 2.5mg  today and tomorrow then have labwork repeated on Monday (pt would rather wait until next week for admission).  Will discuss with Dr. Graciela Husbands if any additional work-up needed given subtherapeutic INR today.

## 2011-06-30 NOTE — Telephone Encounter (Addendum)
ROI faxed to [facility] @ [phone]  06/30/11/km  Records received from [facility] gave to [facility] @ [place] 06/30/11/km

## 2011-07-05 ENCOUNTER — Other Ambulatory Visit (INDEPENDENT_AMBULATORY_CARE_PROVIDER_SITE_OTHER): Payer: Medicare Other | Admitting: *Deleted

## 2011-07-05 ENCOUNTER — Ambulatory Visit: Payer: Medicare Other | Admitting: Internal Medicine

## 2011-07-05 ENCOUNTER — Inpatient Hospital Stay (HOSPITAL_COMMUNITY)
Admission: RE | Admit: 2011-07-05 | Discharge: 2011-07-08 | DRG: 310 | Disposition: A | Discharge status: Home / Self Care | Payer: Medicare Other | Source: Ambulatory Visit | Attending: Internal Medicine | Admitting: Internal Medicine

## 2011-07-05 DIAGNOSIS — Z7901 Long term (current) use of anticoagulants: Secondary | ICD-10-CM

## 2011-07-05 DIAGNOSIS — E785 Hyperlipidemia, unspecified: Secondary | ICD-10-CM | POA: Diagnosis present

## 2011-07-05 DIAGNOSIS — I472 Ventricular tachycardia, unspecified: Secondary | ICD-10-CM | POA: Diagnosis present

## 2011-07-05 DIAGNOSIS — G4733 Obstructive sleep apnea (adult) (pediatric): Secondary | ICD-10-CM | POA: Diagnosis present

## 2011-07-05 DIAGNOSIS — I4891 Unspecified atrial fibrillation: Secondary | ICD-10-CM

## 2011-07-05 DIAGNOSIS — K219 Gastro-esophageal reflux disease without esophagitis: Secondary | ICD-10-CM | POA: Diagnosis present

## 2011-07-05 DIAGNOSIS — I4729 Other ventricular tachycardia: Secondary | ICD-10-CM | POA: Diagnosis present

## 2011-07-05 DIAGNOSIS — I428 Other cardiomyopathies: Secondary | ICD-10-CM | POA: Diagnosis present

## 2011-07-05 DIAGNOSIS — I498 Other specified cardiac arrhythmias: Secondary | ICD-10-CM | POA: Diagnosis present

## 2011-07-05 DIAGNOSIS — I10 Essential (primary) hypertension: Secondary | ICD-10-CM | POA: Diagnosis present

## 2011-07-05 DIAGNOSIS — Z95810 Presence of automatic (implantable) cardiac defibrillator: Secondary | ICD-10-CM

## 2011-07-05 DIAGNOSIS — Z8546 Personal history of malignant neoplasm of prostate: Secondary | ICD-10-CM

## 2011-07-05 LAB — PROTIME-INR: Prothrombin Time: 22.9 seconds — ABNORMAL HIGH (ref 11.6–15.2)

## 2011-07-05 LAB — BASIC METABOLIC PANEL
BUN: 17 mg/dL (ref 6–23)
Potassium: 4.3 mEq/L (ref 3.5–5.3)
Sodium: 137 mEq/L (ref 135–145)

## 2011-07-05 LAB — MAGNESIUM: Magnesium: 2.3 mg/dL (ref 1.5–2.5)

## 2011-07-05 LAB — DIGOXIN LEVEL: Digoxin Level: 0.4 ng/mL — ABNORMAL LOW (ref 0.8–2.0)

## 2011-07-05 NOTE — Progress Notes (Signed)
Agree with note. 

## 2011-07-06 ENCOUNTER — Ambulatory Visit: Payer: Medicare Other | Admitting: Critical Care Medicine

## 2011-07-06 LAB — CBC
Hemoglobin: 12.6 g/dL — ABNORMAL LOW (ref 13.0–17.0)
MCHC: 32.8 g/dL (ref 30.0–36.0)
RBC: 4.28 MIL/uL (ref 4.22–5.81)
WBC: 6.2 10*3/uL (ref 4.0–10.5)

## 2011-07-06 LAB — HEPARIN LEVEL (UNFRACTIONATED): Heparin Unfractionated: 0.1 IU/mL — ABNORMAL LOW (ref 0.30–0.70)

## 2011-07-06 LAB — PROTIME-INR
INR: 2.05 — ABNORMAL HIGH (ref 0.00–1.49)
Prothrombin Time: 23.5 seconds — ABNORMAL HIGH (ref 11.6–15.2)

## 2011-07-07 LAB — CBC
MCH: 29 pg (ref 26.0–34.0)
MCV: 89.6 fL (ref 78.0–100.0)
Platelets: 263 10*3/uL (ref 150–400)
RDW: 14.3 % (ref 11.5–15.5)
WBC: 6.3 10*3/uL (ref 4.0–10.5)

## 2011-07-08 LAB — BASIC METABOLIC PANEL
BUN: 15 mg/dL (ref 6–23)
GFR calc Af Amer: >60 mL/min (ref >60)
Glucose, Bld: 91 mg/dL (ref 70–99)

## 2011-07-08 LAB — CBC
MCH: 29.8 pg (ref 26.0–34.0)
MCV: 90.1 fL (ref 78.0–100.0)
Platelets: 272 10*3/uL (ref 150–400)
RDW: 14.1 % (ref 11.5–15.5)
WBC: 6.6 10*3/uL (ref 4.0–10.5)

## 2011-07-08 LAB — MAGNESIUM: Magnesium: 2.5 mg/dL (ref 1.5–2.5)

## 2011-07-12 ENCOUNTER — Telehealth: Payer: Self-pay | Admitting: Internal Medicine

## 2011-07-12 MED ORDER — DOFETILIDE 500 MCG PO CAPS: 500.0000 ug | ORAL_CAPSULE | Freq: Two times a day (BID) | ORAL | Status: DC

## 2011-07-12 NOTE — Telephone Encounter (Signed)
tikosyn 5 mg. [facility] on [address] in [place].

## 2011-07-28 ENCOUNTER — Ambulatory Visit (INDEPENDENT_AMBULATORY_CARE_PROVIDER_SITE_OTHER): Payer: Medicare Other | Admitting: Physician Assistant

## 2011-07-28 ENCOUNTER — Other Ambulatory Visit (INDEPENDENT_AMBULATORY_CARE_PROVIDER_SITE_OTHER): Payer: Medicare Other | Admitting: *Deleted

## 2011-07-28 ENCOUNTER — Ambulatory Visit (INDEPENDENT_AMBULATORY_CARE_PROVIDER_SITE_OTHER): Payer: Medicare Other | Admitting: *Deleted

## 2011-07-28 ENCOUNTER — Encounter: Payer: Self-pay | Admitting: Internal Medicine

## 2011-07-28 ENCOUNTER — Encounter: Payer: Self-pay | Admitting: Physician Assistant

## 2011-07-28 VITALS — BP 134/74 | HR 72 | Ht 67.0 in | Wt 201.0 lb

## 2011-07-28 DIAGNOSIS — I509 Heart failure, unspecified: Secondary | ICD-10-CM

## 2011-07-28 DIAGNOSIS — I472 Ventricular tachycardia: Secondary | ICD-10-CM

## 2011-07-28 DIAGNOSIS — Z95810 Presence of automatic (implantable) cardiac defibrillator: Secondary | ICD-10-CM

## 2011-07-28 DIAGNOSIS — I4891 Unspecified atrial fibrillation: Secondary | ICD-10-CM

## 2011-07-28 DIAGNOSIS — R0989 Other specified symptoms and signs involving the circulatory and respiratory systems: Secondary | ICD-10-CM

## 2011-07-28 LAB — BASIC METABOLIC PANEL
CO2: 28 mEq/L (ref 19–32)
Chloride: 106 mEq/L (ref 96–112)
Creatinine, Ser: 0.8 mg/dL (ref 0.4–1.5)
Potassium: 4.6 mEq/L (ref 3.5–5.1)
Sodium: 140 mEq/L (ref 135–145)

## 2011-07-28 LAB — MAGNESIUM: Magnesium: 2.5 mg/dL (ref 1.5–2.5)

## 2011-07-28 NOTE — Progress Notes (Signed)
icd interrogation only  

## 2011-07-28 NOTE — Progress Notes (Signed)
History of Present Illness: Primary Electrophysiologist:  Dr. Sherryl Manges   Cody Gomez is a 77 y.o. male who presents for post hospital follow up.  He has a history of atrial fibrillation as well as ventricular tachycardia and a nonischemic cardiomyopathy.  He is status post AICD.   He has had both appropriate and inappropriate ICD discharges for atrial fibrillation and ventricular tachycardia.  He was evaluated by Dr. Graciela Husbands on 7/13.  He was noted to have slow and fast ventricular tachycardia.  Interrogation of his device demonstrated he had undergone appropriate ICD therapy for this in the past.  It was decided to pursue drug therapy for control of his ventricular tachycardia and his atrial fibrillation.  He was therefore set up for Tikonsyn loading.  He was admitted from 7/23-7/26.  He underwent transesophageal echocardiogram 7/23 with moderately reduced LV function and mild mitral regurgitation.  There was no evidence of left atrial appendage thrombus.  He was therefore placed on Tikonsyn 500 mcg q.12 hours.  Telemetry demonstrated intermittent runs of nonsustained ventricular tachycardia.  His AV delay was changed to 250 ms.  QTc remained stable.  Labs: Hemoglobin 13.5, potassium 4.2, creatinine 0.69, magnesium 2.5.  He still feels fatigued.  He has occasional palpitations.  He denies any rapid palpitations.  He denies syncope.  He denies chest pain.  He has chronic shortness of breath with exertion.  He describes class IIb symptoms.  He denies orthopnea, PND or significant edema.  Past Medical History  Diagnosis Date  . Nonischemic dilated cardiomyopathy     Catheterization 2008 demonstrated 40% LAD  . Ventricular tachycardia     Shock therapy delivered to the his ICD; previous failure to tolerate amiodarone and dronaderone  . ICD (implantable cardiac defibrillator) in place 10/29/10    Medtronic-dual-chamber  . GERD (gastroesophageal reflux disease)   . HLD (hyperlipidemia)   . HTN  (hypertension)   . Atrial fibrillation     chronic coumadin   . History of prostate cancer   . Hyperthyroidism     Current Outpatient Prescriptions  Medication Sig Dispense Refill  . albuterol (PROAIR HFA) 108 (90 BASE) MCG/ACT inhaler Inhale 2 puffs into the lungs every 4 (four) hours as needed.        Marland Kitchen albuterol (PROVENTIL) (2.5 MG/3ML) 0.083% nebulizer solution Take 2.5 mg by nebulization every 6 (six) hours as needed.        . digoxin (LANOXIN) 0.125 MG tablet Take 125 mcg by mouth daily.        Marland Kitchen dofetilide (TIKOSYN) 500 MCG capsule Take 1 capsule (500 mcg total) by mouth 2 (two) times daily.  60 capsule  4  . Fluticasone-Salmeterol (ADVAIR DISKUS) 250-50 MCG/DOSE AEPB Inhale 1 puff into the lungs 2 (two) times daily.        . furosemide (LASIX) 40 MG tablet Take 40 mg by mouth daily.        . Magnesium Chloride (SLOW-MAG PO) Take 71.5 mg by mouth 2 (two) times daily.        . methimazole (TAPAZOLE) 10 MG tablet Take 10 mg by mouth daily.        . metoprolol (LOPRESSOR) 100 MG tablet Take 100 mg by mouth 2 (two) times daily.       . NON FORMULARY CPAP - at bedtime 10cmh20       . omeprazole (PRILOSEC) 20 MG capsule Take 20 mg by mouth daily.        . potassium chloride SA (K-DUR,KLOR-CON)  20 MEQ tablet Take 20 mEq by mouth 2 (two) times daily.        . pravastatin (PRAVACHOL) 40 MG tablet Take 40 mg by mouth daily.        Marland Kitchen spironolactone (ALDACTONE) 25 MG tablet Take 25 mg by mouth daily.        Marland Kitchen warfarin (COUMADIN) 5 MG tablet Take 5 mg by mouth as directed.          Allergies: Allergies  Allergen Reactions  . Dobutamine   . Penicillins     REACTION: swelling    Vital Signs: BP 134/74  Pulse 72  Ht 5\' 7"  (1.702 m)  Wt 201 lb (91.173 kg)  BMI 31.48 kg/m2  PHYSICAL EXAM: Well nourished, well developed, in no acute distress HEENT: normal Neck: no JVD Cardiac:  normal S1, S2; RRR; no murmur Lungs:  clear to auscultation bilaterally, no wheezing, rhonchi or  rales Abd: soft, nontender, no hepatomegaly Ext: no edema Skin: warm and dry Neuro:  CNs 2-12 intact, no focal abnormalities noted  EKG:  AV paced, heart rate 72, QT 500 ms, QTC 547 ms, QRS 166 ms  ASSESSMENT AND PLAN:

## 2011-07-28 NOTE — Assessment & Plan Note (Signed)
Currently stable on Tikosyn.  Check a basic metabolic panel and magnesium today.  QTC is acceptable.  I had his device interrogated today.  He has had several high rate episodes since he was discharged from the hospital.  These were all nonsustained.  No therapies were delivered.  He has not had any ICD shocks.  I will review this with Dr. Graciela Husbands when he is in the office this afternoon.  For now, the patient continue his current therapy and follow up as scheduled with Dr. Graciela Husbands in September.

## 2011-07-28 NOTE — Patient Instructions (Signed)
Your physician recommends that you return for lab work in: TODAY BMET, MAGNESIUM 427.31, 427.1  Your physician recommends that you schedule a follow-up appointment in: 09/02/11 @ 3:30 WITH DR. Graciela Husbands

## 2011-07-28 NOTE — Assessment & Plan Note (Signed)
As noted above.  Coumadin is checked with [facility] in [facility].  He plans to get this checked with his PCP soon.

## 2011-07-30 ENCOUNTER — Telehealth: Payer: Self-pay | Admitting: *Deleted

## 2011-07-30 NOTE — Telephone Encounter (Signed)
pt aware of lab results today.  Cody Gomez  

## 2011-08-03 ENCOUNTER — Encounter: Payer: Self-pay | Admitting: Critical Care Medicine

## 2011-08-03 ENCOUNTER — Ambulatory Visit (INDEPENDENT_AMBULATORY_CARE_PROVIDER_SITE_OTHER): Payer: Medicare Other | Admitting: Critical Care Medicine

## 2011-08-03 DIAGNOSIS — J449 Chronic obstructive pulmonary disease, unspecified: Secondary | ICD-10-CM

## 2011-08-03 DIAGNOSIS — J4489 Other specified chronic obstructive pulmonary disease: Secondary | ICD-10-CM

## 2011-08-03 DIAGNOSIS — J45909 Unspecified asthma, uncomplicated: Secondary | ICD-10-CM

## 2011-08-03 MED ORDER — FLUTICASONE-SALMETEROL 250-50 MCG/DOSE IN AEPB: 1.0000 | INHALATION_SPRAY | Freq: Two times a day (BID) | RESPIRATORY_TRACT | Status: DC

## 2011-08-03 NOTE — Patient Instructions (Signed)
No change in medications. Return in        4 months  Refills on Advair sent

## 2011-08-03 NOTE — Progress Notes (Signed)
Subjective:    Patient ID: Cody Gomez, male    DOB: 10/05/1931, 77 y.o.   MRN: [id number]  HPI This pt has had chronic dyspnea for years. Noted Asthma dx at age 69.  Pt has chronic cough prod white mucus. Pt with dyspnea at exertion and at rest.  Asthma triggers: smells from stores, leather, fumes, strong odors, certain foods:  Pt with freq sinus infection, saw ENT 3months. no xrays done. has a goiter on R neck. thyroid R sided. Pt notesocc dysphagia, chokes on water, had a swallow eval? results? done a [facility]. MBS three months ago>>>results show distal esophageal stricture with reflux.  Pt never tested for allergies  Pt with dx osa on cpap nightly, setting 10cmh20 nasal mask  Pt uses albuterol 1-2 times per day hfa  Pt will use albuterol neb 2-3 times day  Pt has atrial fibrillation. Dr Dulce Sellar is cardiology, pt is on metoprolol 200mg  bid l for rate control  January 26, 2011 4:55 PM  Since last ov pt is improved with less dyspnea. Pt has esoph stricture due to be stretched in [place] soon.  Pt had a cpap titration study, results pending in [place] lab. ONO on RA wiht cpap did not demonstrate any oxygen need.  No new issues.  08/03/2011 Pt in hosp two weeks ago per cardiology for new medications for atrial fibrilliation for rate control Tikosyn  Since started this ? If has helped.  Notes more cough in the am.  Worse in the Am and better as day progresses.  No real chest pain. No f/c/s.   Notes DOE only not at rest.  Still on cpap qhs.     Review of Systems Constitutional:   No  weight loss, night sweats,  Fevers, chills, fatigue, lassitude. HEENT:   No headaches,  Difficulty swallowing,  Tooth/dental problems,  Sore throat,                No sneezing, itching, ear ache, nasal congestion, post nasal drip,   CV:  No chest pain,  Orthopnea, PND, swelling in lower extremities, anasarca, dizziness, palpitations  GI  No heartburn, indigestion, abdominal pain, nausea, vomiting,  diarrhea, change in bowel habits, loss of appetite  Resp: No shortness of breath with exertion or at rest.  No excess mucus, no productive cough,  No non-productive cough,  No coughing up of blood.  No change in color of mucus.  No wheezing.  No chest wall deformity  Skin: no rash or lesions.  GU: no dysuria, change in color of urine, no urgency or frequency.  No flank pain.  MS:  No joint pain or swelling.  No decreased range of motion.  No back pain.  Psych:  No change in mood or affect. No depression or anxiety.  No memory loss.     Objective:   Physical Exam Filed Vitals:   08/03/11 1558  BP: 118/68  Pulse: 70  Temp: 97.9 F (36.6 C)  TempSrc: Oral  Height: 5\' 7"  (1.702 m)  Weight: 203 lb 9.6 oz (92.352 kg)  SpO2: 97%    Gen: Pleasant, well-nourished, in no distress,  normal affect  ENT: No lesions,  mouth clear,  oropharynx clear, no postnasal drip  Neck: No JVD, no TMG, no carotid bruits  Lungs: No use of accessory muscles, no dullness to percussion, distant BS  Cardiovascular: RRR, heart sounds normal, no murmur or gallops, no peripheral edema  Abdomen: soft and NT, no HSM,  BS normal  Musculoskeletal: No deformities, no cyanosis or clubbing  Neuro: alert, non focal  Skin: Warm, no lesions or rashes  PFT Conversion 12/22/2010  FVC PREDICT 3.822  FVC  % Predicted 71.79  FEV1 PREDICT 2.736  FEV % Predicted 66.68  FEV1/FVC PRE 72.158  FEV1/FVC%EXP 92.141  FEF % EXPEC 54.386        Assessment & Plan:   COPD Moderate Copd stable at present Plan No change in inhaled or maintenance medications. Return in   4 months     Updated Medication List Outpatient Encounter Prescriptions as of 08/03/2011  Medication Sig Dispense Refill  . albuterol (PROAIR HFA) 108 (90 BASE) MCG/ACT inhaler Inhale 2 puffs into the lungs every 4 (four) hours as needed.        Marland Kitchen albuterol (PROVENTIL) (2.5 MG/3ML) 0.083% nebulizer solution Take 2.5 mg by nebulization every 6  (six) hours as needed.        . digoxin (LANOXIN) 0.125 MG tablet Take 125 mcg by mouth daily.        Marland Kitchen dofetilide (TIKOSYN) 500 MCG capsule Take 1 capsule (500 mcg total) by mouth 2 (two) times daily.  60 capsule  4  . Fluticasone-Salmeterol (ADVAIR DISKUS) 250-50 MCG/DOSE AEPB Inhale 1 puff into the lungs 2 (two) times daily.  60 each  6  . furosemide (LASIX) 40 MG tablet Take 40 mg by mouth daily.        . Magnesium Chloride (SLOW-MAG PO) Take 71.5 mg by mouth 2 (two) times daily.        . methimazole (TAPAZOLE) 10 MG tablet Take 10 mg by mouth daily.        . metoprolol (LOPRESSOR) 100 MG tablet Take 100 mg by mouth 2 (two) times daily.       . NON FORMULARY CPAP - at bedtime 10cmh20       . omeprazole (PRILOSEC) 20 MG capsule Take 20 mg by mouth daily.        . potassium chloride SA (K-DUR,KLOR-CON) 20 MEQ tablet Take 20 mEq by mouth 2 (two) times daily.        . pravastatin (PRAVACHOL) 40 MG tablet Take 40 mg by mouth daily.        Marland Kitchen spironolactone (ALDACTONE) 25 MG tablet Take 25 mg by mouth daily.        Marland Kitchen warfarin (COUMADIN) 5 MG tablet Take 5 mg by mouth as directed.        Marland Kitchen DISCONTD: Fluticasone-Salmeterol (ADVAIR DISKUS) 250-50 MCG/DOSE AEPB Inhale 1 puff into the lungs 2 (two) times daily.

## 2011-08-04 NOTE — Assessment & Plan Note (Signed)
Moderate Copd stable at present Plan No change in inhaled or maintenance medications. Return in   4 months

## 2011-08-09 NOTE — Discharge Summary (Signed)
NAMEJALAN, Cody Gomez                 ACCOUNT NO.:  [id number]  MEDICAL RECORD NO.:  [id number]  LOCATION:  4703                         FACILITY:  [facility]  PHYSICIAN:  Duke Salvia, MD, FACCDATE OF BIRTH:  1931-07-12  DATE OF ADMISSION:  07/05/2011 DATE OF DISCHARGE:  07/08/2011                              DISCHARGE SUMMARY   PRIMARY CARDIOLOGIST:  Duke Salvia, MD, [facility]  PRIMARY CARE PROVIDER:  Dr. Desmond Dike.  DISCHARGE DIAGNOSIS:  Atrial fibrillation.  SECONDARY DIAGNOSES: 1. Nonsustained ventricular tachycardia. 2. Nonischemic cardiomyopathy with moderately reduced left ventricular     function by transesophageal echocardiogram this admission. 3. Obstructive sleep apnea. 4. Gastroesophageal reflux disease. 5. Hypertension. 6. Hyperlipidemia. 7. History of prostate cancer status post prostatectomy. 8. Status post appendectomy. 9. Status post hernia repair.  ALLERGIES:  PENICILLIN.  PROCEDURES:  Transesophageal echocardiogram performed on July 05, 2011, showing no evidence of left atrial or left atrial appendage thrombus. Moderately reduced LV function.  Mild mitral regurgitation.  Trace tricuspid regurgitation.  Mild fixed plaque of the thoracic aorta.  HISTORY OF PRESENT ILLNESS:  A 77 year old male with history of atrial fibrillation as well as nonsustained ventricular tachycardia.  The patient also has history of nonischemic cardiomyopathy and status post ICD placement and has been experiencing both appropriate and inappropriate ICD discharges for AFib and VT.  He was recently seen in clinic by Dr. Graciela Husbands, and decision was made to proceed with initiation of Tikosyn.  HOSPITAL COURSE: 1. The patient presented for left transesophageal echocardiogram and     admission on July 05, 2011.  TEE showed no evidence of left atrial     or left atrial appendage thrombus.  Therefore, the patient was     admitted for Tikosyn loading.  Based on creatinine clearance, the     patient was placed on Tikosyn 500 mcg q.12 hours.  The patient     tolerated this dose well with stable QTc and has been AV paced     throughout admission.  He has had intermittent runs of nonsustained     ventricular tachycardia ranging from 10-23 beats.  His electrolytes     have been stable.  Runs of VT have been asymptomatic. 2. Promote intrinsic conduction, the patient's AV delay was changed to     250 milliseconds.  We will plan to discharge patient home today in     good condition.  DISCHARGE LABS:  Hemoglobin 13.5, hematocrit 40.8, WBC 6.6, platelets 272, INR 2.49.  A basic metabolic panel, magnesium were pending at the time of this dictation.  Digoxin was 0.4.  FOLLOWUP PLANS AND APPOINTMENTS:  The patient will follow up with Dr. Sheria Lang as previously scheduled for Coumadin followup.  Also follow up with Tereso Newcomer, PA in our office on July 28, 2011, at 10 a.m. at which point we will obtain ECG and a basic metabolic panel.  Followup with Dr. Graciela Husbands on September 09, 2011, at 3:30 p.m.  DISCHARGE MEDICATIONS: 1. Tikosyn 500 mcg b.i.d. 2. Advair Diskus 250/50 one puff b.i.d. 3. Albuterol inhaler 2 puffs q.4 hours p.r.n. 4. Digoxin 0.125 mg daily. 5. Lasix 40 mg daily. 6. Metoprolol tartrate 100  mg b.i.d. 7. Omeprazole 20 mg daily. 8. Potassium chloride 20 mEq b.i.d. 9. Pravastatin 40 mg at bedtime. 10.Slow-Mag 71.5 mg b.i.d. 11.Spironolactone 25 mg daily. 12.Tapazole 10 mg daily. 13.Coumadin 5 mg daily.  OUTSTANDING LAB STUDIES:  Basic metabolic panel and magnesium are currently pending.  DURATION OF DISCHARGE ENCOUNTER:  Forty-five minutes including physician time.     Nicolasa Ducking, ANP   ______________________________ Duke Salvia, MD, [facility]    CB/MEDQ  D:  07/08/2011  T:  07/08/2011  Job:  [id number]  cc:   Dr. Desmond Dike  Electronically Signed by Nicolasa Ducking ANP on 07/13/2011 07:27:01 PM Electronically Signed by Sherryl Manges MD [facility]  on 08/09/2011 01:50:35 PM

## 2011-08-27 ENCOUNTER — Telehealth: Payer: Self-pay | Admitting: *Deleted

## 2011-08-27 DIAGNOSIS — T82198A Other mechanical complication of other cardiac electronic device, initial encounter: Secondary | ICD-10-CM

## 2011-08-27 NOTE — Telephone Encounter (Signed)
Checking lead 

## 2011-09-02 ENCOUNTER — Encounter: Payer: Self-pay | Admitting: Internal Medicine

## 2011-09-02 ENCOUNTER — Ambulatory Visit (INDEPENDENT_AMBULATORY_CARE_PROVIDER_SITE_OTHER)
Admission: RE | Admit: 2011-09-02 | Discharge: 2011-09-02 | Disposition: A | Discharge status: Home / Self Care | Payer: Medicare Other | Source: Ambulatory Visit | Attending: Internal Medicine | Admitting: Internal Medicine

## 2011-09-02 ENCOUNTER — Ambulatory Visit (INDEPENDENT_AMBULATORY_CARE_PROVIDER_SITE_OTHER): Payer: Medicare Other | Admitting: Internal Medicine

## 2011-09-02 DIAGNOSIS — T82198A Other mechanical complication of other cardiac electronic device, initial encounter: Secondary | ICD-10-CM

## 2011-09-02 DIAGNOSIS — I428 Other cardiomyopathies: Secondary | ICD-10-CM

## 2011-09-02 DIAGNOSIS — Z95810 Presence of automatic (implantable) cardiac defibrillator: Secondary | ICD-10-CM

## 2011-09-02 DIAGNOSIS — I472 Ventricular tachycardia: Secondary | ICD-10-CM

## 2011-09-02 DIAGNOSIS — I4891 Unspecified atrial fibrillation: Secondary | ICD-10-CM

## 2011-09-02 NOTE — Assessment & Plan Note (Signed)
Continue current medications. 

## 2011-09-02 NOTE — Assessment & Plan Note (Signed)
The patient's device was interrogated.  The information was reviewed. No changes were made in the programming.    

## 2011-09-02 NOTE — Progress Notes (Signed)
HPI  Cody Gomez is a 77 y.o. male    Seen in  followup for nonischemic myopathy and ventricular tachycardia as well as atrial fibrillation. He was started on Tikosyn for drug suppression.he comes in today in followup feeling a good deal better. His wife says his color is better and he has more energy.  Potassium and magnesium levels in mid-August were normal      Past Medical History  Diagnosis Date  . Nonischemic dilated cardiomyopathy     Catheterization 2008 demonstrated 40% LAD  . Ventricular tachycardia     Shock therapy delivered to the his ICD; previous failure to tolerate amiodarone and dronaderone  . ICD (implantable cardiac defibrillator) in place 10/29/10    Medtronic-dual-chamber  . GERD (gastroesophageal reflux disease)   . HLD (hyperlipidemia)   . HTN (hypertension)   . Atrial fibrillation     chronic coumadin   . History of prostate cancer   . Hyperthyroidism     Past Surgical History  Procedure Date  . Prostatectomy   . Pacemaker insertion     medtronic  . Appendectomy   . Hernia repair     Current Outpatient Prescriptions  Medication Sig Dispense Refill  . albuterol (PROAIR HFA) 108 (90 BASE) MCG/ACT inhaler Inhale 2 puffs into the lungs every 4 (four) hours as needed.        Marland Kitchen albuterol (PROVENTIL) (2.5 MG/3ML) 0.083% nebulizer solution Take 2.5 mg by nebulization every 6 (six) hours as needed.        . digoxin (LANOXIN) 0.125 MG tablet Take 125 mcg by mouth daily.        Marland Kitchen dofetilide (TIKOSYN) 500 MCG capsule Take 1 capsule (500 mcg total) by mouth 2 (two) times daily.  60 capsule  4  . Fluticasone-Salmeterol (ADVAIR DISKUS) 250-50 MCG/DOSE AEPB Inhale 1 puff into the lungs 2 (two) times daily.  60 each  6  . furosemide (LASIX) 40 MG tablet Take 40 mg by mouth daily.        . Magnesium Chloride (SLOW-MAG PO) Take 71.5 mg by mouth 2 (two) times daily.        . methimazole (TAPAZOLE) 10 MG tablet Take 10 mg by mouth daily.        . metoprolol  (LOPRESSOR) 100 MG tablet Take 100 mg by mouth 2 (two) times daily.       . NON FORMULARY CPAP - at bedtime 10cmh20       . omeprazole (PRILOSEC) 20 MG capsule Take 20 mg by mouth daily.        . potassium chloride SA (K-DUR,KLOR-CON) 20 MEQ tablet Take 20 mEq by mouth 2 (two) times daily.        . pravastatin (PRAVACHOL) 40 MG tablet Take 40 mg by mouth daily.        Marland Kitchen spironolactone (ALDACTONE) 25 MG tablet Take 25 mg by mouth daily.        Marland Kitchen warfarin (COUMADIN) 5 MG tablet Take 5 mg by mouth as directed.          Allergies  Allergen Reactions  . Dobutamine   . Penicillins     REACTION: swelling    Review of Systems negative except from HPI and PMH  Physical Exam Well developed and well nourished in no acute distress HENT normal E scleral and icterus clear Neck Supple JVP flat; carotids brisk and full Clear to ausculation Regular rate and rhythm, no murmurs gallops or rub Soft with active bowel sounds  No clubbing cyanosis and edema Alert and oriented, grossly normal motor and sensory function Skin Warm and Dry  ECG  Assessment and  Plan

## 2011-09-02 NOTE — Assessment & Plan Note (Signed)
There has been a marked decrease in the frequency detected episodes of ventricular tachycardia nonsustained. We will continue him on his Tikosyn. There've been no episodes of atrial fibrillation. He will need to have his metabolic profile and hismagnesium measured

## 2011-09-02 NOTE — Patient Instructions (Signed)
Your physician wants you to follow-up in: 6 MONTHS You will receive a reminder letter in the mail two months in advance. If you don't receive a letter, please call our office to schedule the follow-up appointment.   Your physician recommends that you return for lab work in: TODAY  

## 2011-09-02 NOTE — Assessment & Plan Note (Signed)
No intercurrent atrial fibrillation continue Coumadin

## 2011-09-03 LAB — BASIC METABOLIC PANEL
CO2: 25 mEq/L (ref 19–32)
Calcium: 9.2 mg/dL (ref 8.4–10.5)
Chloride: 107 mEq/L (ref 96–112)
Glucose, Bld: 100 mg/dL — ABNORMAL HIGH (ref 70–99)
Potassium: 4.3 mEq/L (ref 3.5–5.1)
Sodium: 139 mEq/L (ref 135–145)

## 2011-09-09 NOTE — Progress Notes (Signed)
Addended by: Burnett Kanaris A on: 09/09/2011 02:30 PM   Modules accepted: Orders

## 2011-10-07 ENCOUNTER — Encounter: Payer: Self-pay | Admitting: Internal Medicine

## 2011-10-26 ENCOUNTER — Telehealth: Payer: Self-pay | Admitting: Internal Medicine

## 2011-10-26 MED ORDER — POTASSIUM CHLORIDE CRYS ER 20 MEQ PO TBCR: 20.0000 meq | EXTENDED_RELEASE_TABLET | Freq: Two times a day (BID) | ORAL | Status: DC

## 2011-10-26 NOTE — Telephone Encounter (Signed)
Spoke with pt, informing him I was sending in potassium 20 mg to [facility] as requested. Caralee Ates, CMA

## 2011-10-26 NOTE — Telephone Encounter (Signed)
Pt wants a refill klor-con potassium chloride [facility] in [place]

## 2011-10-29 ENCOUNTER — Other Ambulatory Visit: Payer: Self-pay | Admitting: Internal Medicine

## 2011-11-02 ENCOUNTER — Other Ambulatory Visit: Payer: Self-pay | Admitting: Internal Medicine

## 2011-12-02 ENCOUNTER — Encounter: Payer: Medicare Other | Admitting: *Deleted

## 2011-12-10 ENCOUNTER — Encounter: Payer: Self-pay | Admitting: *Deleted

## 2011-12-15 ENCOUNTER — Other Ambulatory Visit: Payer: Self-pay | Admitting: *Deleted

## 2011-12-15 DIAGNOSIS — Z7901 Long term (current) use of anticoagulants: Secondary | ICD-10-CM | POA: Diagnosis not present

## 2011-12-15 MED ORDER — DOFETILIDE 500 MCG PO CAPS: 500.0000 ug | ORAL_CAPSULE | Freq: Two times a day (BID) | ORAL | Status: DC

## 2011-12-16 ENCOUNTER — Ambulatory Visit (INDEPENDENT_AMBULATORY_CARE_PROVIDER_SITE_OTHER): Payer: Medicare Other | Admitting: *Deleted

## 2011-12-16 ENCOUNTER — Encounter: Payer: Self-pay | Admitting: Internal Medicine

## 2011-12-16 ENCOUNTER — Other Ambulatory Visit: Payer: Self-pay | Admitting: Internal Medicine

## 2011-12-16 DIAGNOSIS — Z95810 Presence of automatic (implantable) cardiac defibrillator: Secondary | ICD-10-CM

## 2011-12-16 DIAGNOSIS — I509 Heart failure, unspecified: Secondary | ICD-10-CM

## 2011-12-16 DIAGNOSIS — I472 Ventricular tachycardia: Secondary | ICD-10-CM | POA: Diagnosis not present

## 2011-12-16 DIAGNOSIS — I4891 Unspecified atrial fibrillation: Secondary | ICD-10-CM

## 2011-12-22 DIAGNOSIS — Z7901 Long term (current) use of anticoagulants: Secondary | ICD-10-CM | POA: Diagnosis not present

## 2011-12-23 ENCOUNTER — Encounter: Payer: Self-pay | Admitting: *Deleted

## 2011-12-23 NOTE — Progress Notes (Signed)
Remote icd check w/icm  

## 2012-01-05 DIAGNOSIS — E7800 Pure hypercholesterolemia, unspecified: Secondary | ICD-10-CM | POA: Diagnosis not present

## 2012-01-05 DIAGNOSIS — Z79899 Other long term (current) drug therapy: Secondary | ICD-10-CM | POA: Diagnosis not present

## 2012-01-05 DIAGNOSIS — Z23 Encounter for immunization: Secondary | ICD-10-CM | POA: Diagnosis not present

## 2012-01-05 DIAGNOSIS — R040 Epistaxis: Secondary | ICD-10-CM | POA: Diagnosis not present

## 2012-01-21 ENCOUNTER — Encounter: Payer: Self-pay | Admitting: Critical Care Medicine

## 2012-01-21 ENCOUNTER — Ambulatory Visit (INDEPENDENT_AMBULATORY_CARE_PROVIDER_SITE_OTHER): Payer: Medicare Other | Admitting: Critical Care Medicine

## 2012-01-21 DIAGNOSIS — J449 Chronic obstructive pulmonary disease, unspecified: Secondary | ICD-10-CM

## 2012-01-21 NOTE — Progress Notes (Signed)
Subjective:    Patient ID: Cody Gomez, male    DOB: July 04, 1931, 78 y.o.   MRN: [id number]  HPI  78 y.o. WM with Copd and reversable components  01/21/2012 Since last OV 8/12: ok until three weeks and got viral URI.  Symptoms were lung congestion, nasal drainage and stopped up.  Now is better.  PCP Rx with ABX made blood too thin.  Bruised with this.  Now INR is at 2.3   Now some cough in AM.  Only dyspneic with exertion. On Cpap.  Past Medical History  Diagnosis Date  . Nonischemic dilated cardiomyopathy     Catheterization 2008 demonstrated 40% LAD  . Ventricular tachycardia     Shock therapy delivered to the his ICD; previous failure to tolerate amiodarone and dronaderone  . ICD (implantable cardiac defibrillator) in place 10/29/10    Medtronic-dual-chamber  . GERD (gastroesophageal reflux disease)   . HLD (hyperlipidemia)   . HTN (hypertension)   . Atrial fibrillation     chronic coumadin   . History of prostate cancer   . Hyperthyroidism      Family History  Problem Relation Age of Onset  . Heart disease Mother   . Cancer Father     bladder      History   Social History  . Marital Status: Single    Spouse Name: N/A    Number of Children: N/A  . Years of Education: N/A   Occupational History  . Not on file.   Social History Main Topics  . Smoking status: Former Smoker -- 2.0 packs/day for 38 years    Types: Cigarettes, Pipe, Cigars    Quit date: 12/13/1982  . Smokeless tobacco: Former Neurosurgeon    Types: Chew    Quit date: 12/14/1983   Comment: started when 14; quit in 1984, up to 2 ppd.   . Alcohol Use: Not on file  . Drug Use: Not on file  . Sexually Active: Not on file   Other Topics Concern  . Not on file   Social History Narrative   Married, 1 son; retired Naval architect.      Allergies  Allergen Reactions  . Dobutamine   . Penicillins     REACTION: swelling     Outpatient Prescriptions Prior to Visit  Medication Sig Dispense Refill  .  albuterol (PROAIR HFA) 108 (90 BASE) MCG/ACT inhaler Inhale 2 puffs into the lungs every 4 (four) hours as needed.        Marland Kitchen albuterol (PROVENTIL) (2.5 MG/3ML) 0.083% nebulizer solution Take 2.5 mg by nebulization every 6 (six) hours as needed.        . digoxin (LANOXIN) 0.125 MG tablet Take 125 mcg by mouth daily.        Marland Kitchen dofetilide (TIKOSYN) 500 MCG capsule Take 1 capsule (500 mcg total) by mouth 2 (two) times daily.  60 capsule  4  . Fluticasone-Salmeterol (ADVAIR DISKUS) 250-50 MCG/DOSE AEPB Inhale 1 puff into the lungs 2 (two) times daily.  60 each  6  . furosemide (LASIX) 40 MG tablet Take 40 mg by mouth daily.        . Magnesium Chloride (SLOW-MAG PO) Take 71.5 mg by mouth 2 (two) times daily.        . methimazole (TAPAZOLE) 10 MG tablet Take 10 mg by mouth daily.        . metoprolol (LOPRESSOR) 100 MG tablet Take 1 tablet (100 mg total) by mouth 2 (two) times daily.  60 tablet  4  . NON FORMULARY CPAP - at bedtime 10cmh20       . omeprazole (PRILOSEC) 20 MG capsule Take 20 mg by mouth daily as needed.       . potassium chloride SA (K-DUR,KLOR-CON) 20 MEQ tablet Take 1 tablet (20 mEq total) by mouth 2 (two) times daily.  60 tablet  6  . spironolactone (ALDACTONE) 25 MG tablet TAKE 1 TABLET DAILY.  30 tablet  4  . warfarin (COUMADIN) 5 MG tablet Take 5 mg by mouth as directed.        . pravastatin (PRAVACHOL) 40 MG tablet Take 40 mg by mouth daily.            Review of Systems  Constitutional:   No  weight loss, night sweats,  Fevers, chills, fatigue, lassitude. HEENT:   No headaches,  Difficulty swallowing,  Tooth/dental problems,  Sore throat,                No sneezing, itching, ear ache, nasal congestion, post nasal drip,   CV:  No chest pain,  Orthopnea, PND, swelling in lower extremities, anasarca, dizziness, palpitations  GI  No heartburn, indigestion, abdominal pain, nausea, vomiting, diarrhea, change in bowel habits, loss of appetite  Resp: Notes  shortness of breath with  exertion not at rest.  No excess mucus, no productive cough,  No non-productive cough,  No coughing up of blood.  No change in color of mucus.  No wheezing.  No chest wall deformity  Skin: no rash or lesions.  GU: no dysuria, change in color of urine, no urgency or frequency.  No flank pain.  MS:  No joint pain or swelling.  No decreased range of motion.  No back pain.  Psych:  No change in mood or affect. No depression or anxiety.  No memory loss.     Objective:   Physical Exam  Filed Vitals:   01/21/12 1013  BP: 136/78  Pulse: 70  Temp: 97.8 F (36.6 C)  TempSrc: Oral  Height: 5\' 7"  (1.702 m)  Weight: 204 lb 6.4 oz (92.715 kg)  SpO2: 96%    Gen: Pleasant, well-nourished, in no distress,  normal affect  ENT: No lesions,  mouth clear,  oropharynx clear, no postnasal drip  Neck: No JVD, no TMG, no carotid bruits  Lungs: No use of accessory muscles, no dullness to percussion, distant BS  Cardiovascular: RRR, heart sounds normal, no murmur or gallops, no peripheral edema  Abdomen: soft and NT, no HSM,  BS normal  Musculoskeletal: No deformities, no cyanosis or clubbing  Neuro: alert, non focal  Skin: Warm, no lesions or rashes  PFT Conversion 12/22/2010  FVC PREDICT 3.822  FVC  % Predicted 71.79  FEV1 PREDICT 2.736  FEV % Predicted 66.68  FEV1/FVC PRE 72.158  FEV1/FVC%EXP 92.141  FEF % EXPEC 54.386        Assessment & Plan:   COPD Chronic obstructive lung disease with reversible airflow component now improved Plan Maintain inhaled medications as currently prescribed Return 6 months     Updated Medication List Outpatient Encounter Prescriptions as of 01/21/2012  Medication Sig Dispense Refill  . albuterol (PROAIR HFA) 108 (90 BASE) MCG/ACT inhaler Inhale 2 puffs into the lungs every 4 (four) hours as needed.        Marland Kitchen albuterol (PROVENTIL) (2.5 MG/3ML) 0.083% nebulizer solution Take 2.5 mg by nebulization every 6 (six) hours as needed.        Marland Kitchen  atorvastatin (LIPITOR) 10 MG tablet Take 1 tablet by mouth at bedtime.      Marland Kitchen CHERATUSSIN AC 100-10 MG/5ML syrup As needed      . digoxin (LANOXIN) 0.125 MG tablet Take 125 mcg by mouth daily.        Marland Kitchen dofetilide (TIKOSYN) 500 MCG capsule Take 1 capsule (500 mcg total) by mouth 2 (two) times daily.  60 capsule  4  . Fluticasone-Salmeterol (ADVAIR DISKUS) 250-50 MCG/DOSE AEPB Inhale 1 puff into the lungs 2 (two) times daily.  60 each  6  . furosemide (LASIX) 40 MG tablet Take 40 mg by mouth daily.        Marland Kitchen guaiFENesin (MUCINEX) 600 MG 12 hr tablet Take 600 mg by mouth at bedtime.      . Magnesium Chloride (SLOW-MAG PO) Take 71.5 mg by mouth 2 (two) times daily.        . methimazole (TAPAZOLE) 10 MG tablet Take 10 mg by mouth daily.        . metoprolol (LOPRESSOR) 100 MG tablet Take 1 tablet (100 mg total) by mouth 2 (two) times daily.  60 tablet  4  . NON FORMULARY CPAP - at bedtime 10cmh20       . omeprazole (PRILOSEC) 20 MG capsule Take 20 mg by mouth daily as needed.       . potassium chloride SA (K-DUR,KLOR-CON) 20 MEQ tablet Take 1 tablet (20 mEq total) by mouth 2 (two) times daily.  60 tablet  6  . spironolactone (ALDACTONE) 25 MG tablet TAKE 1 TABLET DAILY.  30 tablet  4  . warfarin (COUMADIN) 5 MG tablet Take 5 mg by mouth as directed.        Marland Kitchen DISCONTD: pravastatin (PRAVACHOL) 40 MG tablet Take 40 mg by mouth daily.

## 2012-01-21 NOTE — Assessment & Plan Note (Signed)
Chronic obstructive lung disease with reversible airflow component now improved Plan Maintain inhaled medications as currently prescribed Return 6 months

## 2012-01-21 NOTE — Patient Instructions (Signed)
No change in medications. Return in        6 months        

## 2012-01-24 DIAGNOSIS — Z7901 Long term (current) use of anticoagulants: Secondary | ICD-10-CM | POA: Diagnosis not present

## 2012-02-09 DIAGNOSIS — Z7901 Long term (current) use of anticoagulants: Secondary | ICD-10-CM | POA: Diagnosis not present

## 2012-02-22 DIAGNOSIS — N390 Urinary tract infection, site not specified: Secondary | ICD-10-CM | POA: Diagnosis not present

## 2012-02-22 DIAGNOSIS — R5381 Other malaise: Secondary | ICD-10-CM | POA: Diagnosis not present

## 2012-02-22 DIAGNOSIS — C61 Malignant neoplasm of prostate: Secondary | ICD-10-CM | POA: Diagnosis not present

## 2012-02-23 DIAGNOSIS — Z7901 Long term (current) use of anticoagulants: Secondary | ICD-10-CM | POA: Diagnosis not present

## 2012-03-01 ENCOUNTER — Ambulatory Visit (INDEPENDENT_AMBULATORY_CARE_PROVIDER_SITE_OTHER): Payer: Medicare Other | Admitting: Internal Medicine

## 2012-03-01 ENCOUNTER — Encounter: Payer: Self-pay | Admitting: Internal Medicine

## 2012-03-01 VITALS — BP 127/75 | HR 69 | Ht 68.0 in | Wt 201.0 lb

## 2012-03-01 DIAGNOSIS — I4891 Unspecified atrial fibrillation: Secondary | ICD-10-CM

## 2012-03-01 DIAGNOSIS — Z95810 Presence of automatic (implantable) cardiac defibrillator: Secondary | ICD-10-CM

## 2012-03-01 DIAGNOSIS — I428 Other cardiomyopathies: Secondary | ICD-10-CM | POA: Diagnosis not present

## 2012-03-01 DIAGNOSIS — I472 Ventricular tachycardia, unspecified: Secondary | ICD-10-CM

## 2012-03-01 DIAGNOSIS — I10 Essential (primary) hypertension: Secondary | ICD-10-CM | POA: Diagnosis not present

## 2012-03-01 LAB — ICD DEVICE OBSERVATION
AL AMPLITUDE: 2.9389 mv
AL THRESHOLD: 1 V
BAMS-0001: 170 {beats}/min
FVT: 0
PACEART VT: 15
RV LEAD AMPLITUDE: 7.2038 mv
RV LEAD IMPEDENCE ICD: 672 Ohm
RV LEAD THRESHOLD: 0.5 V
TZAT-0001ATACH: 3
TZAT-0001FASTVT: 1
TZAT-0001SLOWVT: 1
TZAT-0002ATACH: NEGATIVE
TZAT-0002FASTVT: NEGATIVE
TZAT-0004ATACH: 7
TZAT-0011ATACH: 10 ms
TZAT-0011SLOWVT: 10 ms
TZAT-0011SLOWVT: 10 ms
TZAT-0012ATACH: 150 ms
TZAT-0012ATACH: 150 ms
TZAT-0013SLOWVT: 2
TZAT-0013SLOWVT: 2
TZAT-0018ATACH: NEGATIVE
TZAT-0018ATACH: NEGATIVE
TZAT-0018SLOWVT: NEGATIVE
TZAT-0018SLOWVT: NEGATIVE
TZAT-0019ATACH: 6 V
TZAT-0019ATACH: 6 V
TZAT-0019SLOWVT: 8 V
TZAT-0019SLOWVT: 8 V
TZAT-0020ATACH: 1.5 ms
TZON-0003VSLOWVT: 450 ms
TZON-0005SLOWVT: 16
TZON-0010FASTVT: 60 ms
TZST-0001ATACH: 4
TZST-0001ATACH: 5
TZST-0001ATACH: 6
TZST-0001FASTVT: 3
TZST-0001FASTVT: 5
TZST-0001SLOWVT: 6
TZST-0002ATACH: NEGATIVE
TZST-0002ATACH: NEGATIVE
TZST-0002FASTVT: NEGATIVE
TZST-0002FASTVT: NEGATIVE
TZST-0002SLOWVT: NEGATIVE
TZST-0002SLOWVT: NEGATIVE
TZST-0002SLOWVT: NEGATIVE
VF: 2

## 2012-03-01 LAB — BASIC METABOLIC PANEL
BUN: 16 mg/dL (ref 6–23)
Calcium: 9.5 mg/dL (ref 8.4–10.5)
Chloride: 101 mEq/L (ref 96–112)
Creatinine, Ser: 0.9 mg/dL (ref 0.4–1.5)

## 2012-03-01 LAB — MAGNESIUM: Magnesium: 2.4 mg/dL (ref 1.5–2.5)

## 2012-03-01 MED ORDER — LISINOPRIL 2.5 MG PO TABS: 2.5000 mg | ORAL_TABLET | Freq: Every day | ORAL | Status: AC

## 2012-03-01 NOTE — Assessment & Plan Note (Signed)
Stable With check bmet

## 2012-03-01 NOTE — Assessment & Plan Note (Signed)
No recurrent atrial fibrillation.

## 2012-03-01 NOTE — Assessment & Plan Note (Signed)
Will add ACE inhibitor therapy. There are data for electrical mechanical interactions were by VT e decreased  with ACE inhibitors.

## 2012-03-01 NOTE — Assessment & Plan Note (Signed)
The patient's device was interrogated.  The information was reviewed. No changes were made in the programming.    

## 2012-03-01 NOTE — Progress Notes (Signed)
HPI  Cody Gomez is a 78 y.o. male Seen in  followup for nonischemic myopathy and ventricular tachycardia as well as atrial fibrillation. He was started on Tikosyn for drug suppression.he comes in today in followup feeling a good deal better. His wife says his color is better and he has more energy.  Potassium and magnesium levels in mid-August were normal as was cr  The patient denies chest pain,nocturnal dyspnea, orthopnea or peripheral edema.  There have been no palpitations, lightheadedness or syncope;  There has been mild SOB         Past Medical History  Diagnosis Date  . Nonischemic dilated cardiomyopathy     Catheterization 2008 demonstrated 40% LAD  . Ventricular tachycardia     Shock therapy delivered to the his ICD; previous failure to tolerate amiodarone and dronaderone  . ICD (implantable cardiac defibrillator) in place 10/29/10    Medtronic-dual-chamber  . GERD (gastroesophageal reflux disease)   . HLD (hyperlipidemia)   . HTN (hypertension)   . Atrial fibrillation     chronic coumadin   . History of prostate cancer   . Hyperthyroidism     Past Surgical History  Procedure Date  . Prostatectomy   . Pacemaker insertion     medtronic  . Appendectomy   . Hernia repair     Current Outpatient Prescriptions  Medication Sig Dispense Refill  . albuterol (PROAIR HFA) 108 (90 BASE) MCG/ACT inhaler Inhale 2 puffs into the lungs every 4 (four) hours as needed.        Marland Kitchen albuterol (PROVENTIL) (2.5 MG/3ML) 0.083% nebulizer solution Take 2.5 mg by nebulization every 6 (six) hours as needed.        Marland Kitchen atorvastatin (LIPITOR) 10 MG tablet Take 1 tablet by mouth at bedtime.      Marland Kitchen CHERATUSSIN AC 100-10 MG/5ML syrup As needed      . digoxin (LANOXIN) 0.125 MG tablet Take 125 mcg by mouth daily.        Marland Kitchen dofetilide (TIKOSYN) 500 MCG capsule Take 1 capsule (500 mcg total) by mouth 2 (two) times daily.  60 capsule  4  . Fluticasone-Salmeterol (ADVAIR DISKUS) 250-50 MCG/DOSE  AEPB Inhale 1 puff into the lungs 2 (two) times daily.  60 each  6  . furosemide (LASIX) 40 MG tablet Take 40 mg by mouth daily.        Marland Kitchen guaiFENesin (MUCINEX) 600 MG 12 hr tablet Take 600 mg by mouth at bedtime.      . Magnesium Chloride (SLOW-MAG PO) Take 71.5 mg by mouth 2 (two) times daily.        . methimazole (TAPAZOLE) 10 MG tablet Take 10 mg by mouth daily.        . metoprolol (LOPRESSOR) 100 MG tablet Take 1 tablet (100 mg total) by mouth 2 (two) times daily.  60 tablet  4  . NON FORMULARY CPAP - at bedtime 10cmh20       . omeprazole (PRILOSEC) 20 MG capsule Take 20 mg by mouth daily as needed.       . potassium chloride SA (K-DUR,KLOR-CON) 20 MEQ tablet Take 1 tablet (20 mEq total) by mouth 2 (two) times daily.  60 tablet  6  . spironolactone (ALDACTONE) 25 MG tablet TAKE 1 TABLET DAILY.  30 tablet  4  . warfarin (COUMADIN) 5 MG tablet Take 4 mg by mouth as directed.         Allergies  Allergen Reactions  . Dobutamine   .  Penicillins     REACTION: swelling    Review of Systems negative except from HPI and PMH  Physical Exam BP 127/75  Pulse 69  Ht 5\' 8"  (1.727 m)  Wt 201 lb (91.173 kg)  BMI 30.56 kg/m2 Well developed and well nourished in no acute distress HENT normal E scleral and icterus clear Neck Supple JVP >10  carotids brisk and full Clear to ausculation Regular rate and rhythm, no murmurs gallops or rub Soft with active bowel sounds No clubbing cyanosis none Edema Alert and oriented, grossly normal motor and sensory function Skin Warm and Dry   Assessment and  Plan

## 2012-03-01 NOTE — Patient Instructions (Signed)
Your physician has recommended you make the following change in your medication:  1) Start lisinopril 2.5 mg once daily. 2) For the next 2 weeks increase lasix (furosemide) to 40 mg once daily alternating with 80 mg once daily.  Your physician recommends that you have lab work today: bmp/magnesium/digoxin/ tsh  Your physician recommends that you schedule a follow-up appointment in: 8 weeks.

## 2012-03-01 NOTE — Assessment & Plan Note (Signed)
Recurrent ventricular tachycardia. His PT is somewhat faster it was terminated with antitachycardia pacing although somewhat non-clearly. We will try and modify his like a mechanical issues as there is evidence of volume overload and initiate ACE inhibitor therapy augment diuresis. In the event that any weeks we continue to have ventricular tachycardia will discuss adjunive if antiarrhythmic therapy versus catheter ablation

## 2012-03-02 LAB — DIGOXIN LEVEL: Digoxin Level: 0.7 ng/mL — ABNORMAL LOW (ref 0.8–2.0)

## 2012-03-06 ENCOUNTER — Other Ambulatory Visit: Payer: Self-pay | Admitting: Internal Medicine

## 2012-03-06 ENCOUNTER — Other Ambulatory Visit: Payer: Self-pay

## 2012-03-06 MED ORDER — FUROSEMIDE 40 MG PO TABS: 40.0000 mg | ORAL_TABLET | Freq: Every day | ORAL | Status: DC

## 2012-03-14 DIAGNOSIS — Z7901 Long term (current) use of anticoagulants: Secondary | ICD-10-CM | POA: Diagnosis not present

## 2012-03-27 ENCOUNTER — Other Ambulatory Visit: Payer: Self-pay | Admitting: Internal Medicine

## 2012-03-28 DIAGNOSIS — Z7901 Long term (current) use of anticoagulants: Secondary | ICD-10-CM | POA: Diagnosis not present

## 2012-05-01 DIAGNOSIS — Z7901 Long term (current) use of anticoagulants: Secondary | ICD-10-CM | POA: Diagnosis not present

## 2012-05-04 ENCOUNTER — Encounter: Payer: Self-pay | Admitting: Internal Medicine

## 2012-05-04 ENCOUNTER — Ambulatory Visit (INDEPENDENT_AMBULATORY_CARE_PROVIDER_SITE_OTHER): Payer: Medicare Other | Admitting: Internal Medicine

## 2012-05-04 VITALS — BP 118/68 | HR 75 | Ht 67.0 in | Wt 200.1 lb

## 2012-05-04 DIAGNOSIS — E0590 Thyrotoxicosis, unspecified without thyrotoxic crisis or storm: Secondary | ICD-10-CM | POA: Diagnosis not present

## 2012-05-04 DIAGNOSIS — I472 Ventricular tachycardia: Secondary | ICD-10-CM | POA: Diagnosis not present

## 2012-05-04 DIAGNOSIS — Z95810 Presence of automatic (implantable) cardiac defibrillator: Secondary | ICD-10-CM

## 2012-05-04 LAB — ICD DEVICE OBSERVATION
AL IMPEDENCE ICD: 440 Ohm
ATRIAL PACING ICD: 98.53 pct
CHARGE TIME: 10.33 s
DEV-0020ICD: NEGATIVE
TOT-0001: 10
TOT-0002: 3
TOT-0006: 20080805000000
TZAT-0001ATACH: 3
TZAT-0002ATACH: NEGATIVE
TZAT-0002FASTVT: NEGATIVE
TZAT-0004ATACH: 7
TZAT-0011ATACH: 10 ms
TZAT-0011SLOWVT: 10 ms
TZAT-0011SLOWVT: 10 ms
TZAT-0012ATACH: 150 ms
TZAT-0012SLOWVT: 200 ms
TZAT-0013SLOWVT: 2
TZAT-0013SLOWVT: 2
TZAT-0018ATACH: NEGATIVE
TZAT-0018ATACH: NEGATIVE
TZAT-0018SLOWVT: NEGATIVE
TZAT-0018SLOWVT: NEGATIVE
TZAT-0019ATACH: 6 V
TZAT-0019ATACH: 6 V
TZAT-0019ATACH: 6 V
TZAT-0019SLOWVT: 8 V
TZAT-0019SLOWVT: 8 V
TZAT-0020FASTVT: 1.5 ms
TZON-0003VSLOWVT: 450 ms
TZON-0005SLOWVT: 16
TZON-0010FASTVT: 60 ms
TZST-0001ATACH: 4
TZST-0001ATACH: 5
TZST-0001ATACH: 6
TZST-0001FASTVT: 3
TZST-0001FASTVT: 5
TZST-0001SLOWVT: 6
TZST-0002ATACH: NEGATIVE
TZST-0002ATACH: NEGATIVE
TZST-0002FASTVT: NEGATIVE
TZST-0002FASTVT: NEGATIVE
TZST-0002SLOWVT: NEGATIVE
TZST-0002SLOWVT: NEGATIVE
TZST-0002SLOWVT: NEGATIVE
VENTRICULAR PACING ICD: 1.6 pct

## 2012-05-04 MED ORDER — CARVEDILOL 25 MG PO TABS: 25.0000 mg | ORAL_TABLET | Freq: Two times a day (BID) | ORAL | Status: DC

## 2012-05-04 NOTE — Assessment & Plan Note (Signed)
He continues with ventricular tachycardia occurring right on his detection line between VT/VF. We will reprogram his defibrillator to give him a fast VT zone.  In addition, over the above beta blocker adjustment will help. In the event that it does not we will consider an adjunctive antiarrhythmic therapy versus catheter ablation. I reviewed this with the family

## 2012-05-04 NOTE — Assessment & Plan Note (Signed)
Recheck his TSH in March and it was normal

## 2012-05-04 NOTE — Assessment & Plan Note (Signed)
The patient's device was interrogated and the information was fully reviewed.  The device was reprogrammed to  change his ejection intervals as noted above

## 2012-05-04 NOTE — Progress Notes (Signed)
HPI  Cody Gomez is a 78 y.o. male Seen in followup for nonischemic myopathy and ventricular tachycardia as well as atrial fibrillation. He was started on Tikosyn for drug suppression.he comes in today in followup feeling a good deal better. His wife says his color is better and he has more energy.  Potassium and magnesium levels in mid-August were normal as was cr  The patient denies chest pain,nocturnal dyspnea, orthopnea or peripheral edema. There have been no palpitations, lightheadedness or syncope; There has been mild SOB   He continues to feel pretty good;        Past Medical History  Diagnosis Date  . Nonischemic dilated cardiomyopathy     Catheterization 2008 demonstrated 40% LAD  . Ventricular tachycardia     Shock therapy delivered to the his ICD; previous failure to tolerate amiodarone and dronaderone  . ICD (implantable cardiac defibrillator) in place 10/29/10    Medtronic-dual-chamber  . GERD (gastroesophageal reflux disease)   . HLD (hyperlipidemia)   . HTN (hypertension)   . Atrial fibrillation     chronic coumadin   . History of prostate cancer   . Hyperthyroidism     Past Surgical History  Procedure Date  . Prostatectomy   . Pacemaker insertion     medtronic  . Appendectomy   . Hernia repair     Current Outpatient Prescriptions  Medication Sig Dispense Refill  . atorvastatin (LIPITOR) 10 MG tablet Take 1 tablet by mouth at bedtime.      . digoxin (LANOXIN) 0.125 MG tablet Take 125 mcg by mouth daily.        Marland Kitchen dofetilide (TIKOSYN) 500 MCG capsule Take 1 capsule (500 mcg total) by mouth 2 (two) times daily.  60 capsule  4  . Fluticasone-Salmeterol (ADVAIR DISKUS) 250-50 MCG/DOSE AEPB Inhale 1 puff into the lungs 2 (two) times daily.  60 each  6  . furosemide (LASIX) 40 MG tablet Take 1 tablet (40 mg total) by mouth daily.  30 tablet  3  . guaiFENesin (MUCINEX) 600 MG 12 hr tablet Take 600 mg by mouth at bedtime.      . Magnesium Chloride (SLOW-MAG PO)  Take 71.5 mg by mouth 2 (two) times daily.        . methimazole (TAPAZOLE) 10 MG tablet Take 10 mg by mouth daily.        . metoprolol (LOPRESSOR) 100 MG tablet Take 1 tablet (100 mg total) by mouth 2 (two) times daily.  60 tablet  4  . NON FORMULARY CPAP - at bedtime 10cmh20       . omeprazole (PRILOSEC) 20 MG capsule Take 20 mg by mouth daily as needed.       . potassium chloride SA (K-DUR,KLOR-CON) 20 MEQ tablet Take 1 tablet (20 mEq total) by mouth 2 (two) times daily.  60 tablet  6  . spironolactone (ALDACTONE) 25 MG tablet TAKE 1 TABLET DAILY.  30 tablet  4  . warfarin (COUMADIN) 5 MG tablet Take 4 mg by mouth as directed.       Marland Kitchen albuterol (PROAIR HFA) 108 (90 BASE) MCG/ACT inhaler Inhale 2 puffs into the lungs every 4 (four) hours as needed.        Marland Kitchen albuterol (PROVENTIL) (2.5 MG/3ML) 0.083% nebulizer solution Take 2.5 mg by nebulization every 6 (six) hours as needed.        Marland Kitchen CHERATUSSIN AC 100-10 MG/5ML syrup As needed      . lisinopril (PRINIVIL,ZESTRIL) 2.5 MG tablet  Take 1 tablet (2.5 mg total) by mouth daily.  30 tablet  6    Allergies  Allergen Reactions  . Dobutamine   . Penicillins     REACTION: swelling    Review of Systems negative except from HPI and PMH  Physical Exam BP 118/68  Pulse 75  Ht 5\' 7"  (1.702 m)  Wt 200 lb 1.9 oz (90.774 kg)  BMI 31.34 kg/m2 Well developed and well nourished in no acute distress HENT normal E scleral and icterus clear Neck Supple JVP flat; carotids brisk and full Clear to ausculation Regular rate and rhythm, no murmurs gallops or rub Soft with active bowel sounds No clubbing cyanosis none Edema Alert and oriented, grossly normal motor and sensory function Skin Warm and Dry  ECG today demonstrates atrially paced rhythm at 75 with intervals 0.20/13/42 with a right bundle left axis deviation Assessment and  Plan

## 2012-05-04 NOTE — Assessment & Plan Note (Signed)
No significant intercurrent atrial fibrillation; on warfarin last level III.2

## 2012-05-04 NOTE — Assessment & Plan Note (Signed)
Based on recent data from MADIT-CRT study, we will plan to change his metoprolol to carvedilol.

## 2012-05-04 NOTE — Patient Instructions (Signed)
  Your physician has recommended you make the following change in your medication: STOP METOPROLOL AND START CARVEDILOL 25 MG. Take one tablet twice daily.  Your physician recommends that you schedule a follow-up appointment in: 3 months with Dr. Graciela Husbands

## 2012-05-15 DIAGNOSIS — Z7901 Long term (current) use of anticoagulants: Secondary | ICD-10-CM | POA: Diagnosis not present

## 2012-05-23 ENCOUNTER — Other Ambulatory Visit: Payer: Self-pay | Admitting: Internal Medicine

## 2012-05-26 DIAGNOSIS — K529 Noninfective gastroenteritis and colitis, unspecified: Secondary | ICD-10-CM | POA: Diagnosis not present

## 2012-06-05 DIAGNOSIS — Z7901 Long term (current) use of anticoagulants: Secondary | ICD-10-CM | POA: Diagnosis not present

## 2012-06-08 DIAGNOSIS — Z7901 Long term (current) use of anticoagulants: Secondary | ICD-10-CM | POA: Diagnosis not present

## 2012-06-12 DIAGNOSIS — Z7901 Long term (current) use of anticoagulants: Secondary | ICD-10-CM | POA: Diagnosis not present

## 2012-06-16 ENCOUNTER — Other Ambulatory Visit: Payer: Self-pay | Admitting: Internal Medicine

## 2012-06-16 NOTE — Telephone Encounter (Signed)
Pt calling re being out of med, needs asap

## 2012-06-19 DIAGNOSIS — Z7901 Long term (current) use of anticoagulants: Secondary | ICD-10-CM | POA: Diagnosis not present

## 2012-06-26 DIAGNOSIS — Z7901 Long term (current) use of anticoagulants: Secondary | ICD-10-CM | POA: Diagnosis not present

## 2012-07-12 ENCOUNTER — Other Ambulatory Visit: Payer: Self-pay | Admitting: Internal Medicine

## 2012-07-18 DIAGNOSIS — Z7901 Long term (current) use of anticoagulants: Secondary | ICD-10-CM | POA: Diagnosis not present

## 2012-07-18 DIAGNOSIS — M25569 Pain in unspecified knee: Secondary | ICD-10-CM | POA: Diagnosis not present

## 2012-08-02 ENCOUNTER — Ambulatory Visit (INDEPENDENT_AMBULATORY_CARE_PROVIDER_SITE_OTHER): Payer: Medicare Other | Admitting: Critical Care Medicine

## 2012-08-02 ENCOUNTER — Encounter: Payer: Self-pay | Admitting: Critical Care Medicine

## 2012-08-02 VITALS — BP 122/78 | HR 42 | Temp 97.9°F | Ht 67.0 in | Wt 197.8 lb

## 2012-08-02 DIAGNOSIS — R001 Bradycardia, unspecified: Secondary | ICD-10-CM

## 2012-08-02 DIAGNOSIS — I498 Other specified cardiac arrhythmias: Secondary | ICD-10-CM

## 2012-08-02 DIAGNOSIS — J449 Chronic obstructive pulmonary disease, unspecified: Secondary | ICD-10-CM

## 2012-08-02 NOTE — Patient Instructions (Addendum)
No change in medications. Keep your appt with Dr Graciela Husbands Take it very easy until you see the Cardiology MD Return 6 months

## 2012-08-02 NOTE — Progress Notes (Signed)
Subjective:    Patient ID: Cody Gomez, male    DOB: 1931/01/20, 78 y.o.   MRN: [id number]  HPI  78 y.o. WM with Copd and reversable components   08/02/2012 No real changes pulm wise.  Pt notes onset of dizziness and palpitations x 24hrs.  Pulse in 40s. Pt has AICD and PPM but ? Backup rate?. Pt denies any significant sore throat, nasal congestion or excess secretions, fever, chills, sweats, unintended weight loss, pleurtic or exertional chest pain, orthopnea PND, or leg swelling Pt denies any increase in rescue therapy over baseline, denies waking up needing it or having any early am or nocturnal exacerbations of coughing/wheezing/or dyspnea. Pt also denies any obvious fluctuation in symptoms with  weather or environmental change or other alleviating or aggravating factors   Past Medical History  Diagnosis Date  . Nonischemic dilated cardiomyopathy     Catheterization 2008 demonstrated 40% LAD  . Ventricular tachycardia     Shock therapy delivered to the his ICD; previous failure to tolerate amiodarone and dronaderone  . ICD (implantable cardiac defibrillator) in place 10/29/10    Medtronic-dual-chamber  . GERD (gastroesophageal reflux disease)   . HLD (hyperlipidemia)   . HTN (hypertension)   . Atrial fibrillation     chronic coumadin   . History of prostate cancer   . Hyperthyroidism      Family History  Problem Relation Age of Onset  . Heart disease Mother   . Cancer Father     bladder      History   Social History  . Marital Status: Single    Spouse Name: N/A    Number of Children: N/A  . Years of Education: N/A   Occupational History  . Not on file.   Social History Main Topics  . Smoking status: Former Smoker -- 2.0 packs/day for 38 years    Types: Cigarettes, Pipe, Cigars    Quit date: 12/13/1982  . Smokeless tobacco: Former Neurosurgeon    Types: Chew    Quit date: 12/14/1983   Comment: started when 14; quit in 1984, up to 2 ppd.   . Alcohol Use: Not on  file  . Drug Use: Not on file  . Sexually Active: Not on file   Other Topics Concern  . Not on file   Social History Narrative   Married, 1 son; retired Naval architect.      Allergies  Allergen Reactions  . Dobutamine   . Penicillins     REACTION: swelling     Outpatient Prescriptions Prior to Visit  Medication Sig Dispense Refill  . albuterol (PROAIR HFA) 108 (90 BASE) MCG/ACT inhaler Inhale 2 puffs into the lungs every 4 (four) hours as needed.        Marland Kitchen albuterol (PROVENTIL) (2.5 MG/3ML) 0.083% nebulizer solution Take 2.5 mg by nebulization every 6 (six) hours as needed.        . carvedilol (COREG) 25 MG tablet Take 1 tablet (25 mg total) by mouth 2 (two) times daily.  180 tablet  3  . CHERATUSSIN AC 100-10 MG/5ML syrup As needed      . digoxin (LANOXIN) 0.125 MG tablet Take 125 mcg by mouth daily.        . Fluticasone-Salmeterol (ADVAIR DISKUS) 250-50 MCG/DOSE AEPB Inhale 1 puff into the lungs 2 (two) times daily.  60 each  6  . furosemide (LASIX) 40 MG tablet TAKE 1 TABLET BY MOUTH DAILY.  30 tablet  3  . guaiFENesin (MUCINEX) 600  MG 12 hr tablet Take 600 mg by mouth at bedtime.      Marland Kitchen KLOR-CON M20 20 MEQ tablet TAKE 1 TABLET BY MOUTH TWICE A DAY  60 tablet  3  . lisinopril (PRINIVIL,ZESTRIL) 2.5 MG tablet Take 1 tablet (2.5 mg total) by mouth daily.  30 tablet  6  . Magnesium Chloride (SLOW-MAG PO) Take 71.5 mg by mouth 2 (two) times daily.        . methimazole (TAPAZOLE) 10 MG tablet Take 10 mg by mouth daily.        . NON FORMULARY CPAP - at bedtime 10cmh20       . omeprazole (PRILOSEC) 20 MG capsule Take 20 mg by mouth daily as needed.       Marland Kitchen spironolactone (ALDACTONE) 25 MG tablet TAKE 1 TABLET DAILY.  30 tablet  4  . TIKOSYN 500 MCG capsule TAKE ONE CAPSULE BY MOUTH TWICE A DAY  180 capsule  1  . atorvastatin (LIPITOR) 10 MG tablet Take 1 tablet by mouth at bedtime.      Marland Kitchen warfarin (COUMADIN) 5 MG tablet Take 4 mg by mouth as directed.           Review of  Systems  Constitutional:   No  weight loss, night sweats,  Fevers, chills, fatigue, lassitude. HEENT:   No headaches,  Difficulty swallowing,  Tooth/dental problems,  Sore throat,                No sneezing, itching, ear ache, nasal congestion, post nasal drip,   CV:  No chest pain,  Orthopnea, PND, swelling in lower extremities, anasarca, dizziness, palpitations  GI  No heartburn, indigestion, abdominal pain, nausea, vomiting, diarrhea, change in bowel habits, loss of appetite  Resp: Notes  shortness of breath with exertion not at rest.  No excess mucus, no productive cough,  No non-productive cough,  No coughing up of blood.  No change in color of mucus.  No wheezing.  No chest wall deformity  Skin: no rash or lesions.  GU: no dysuria, change in color of urine, no urgency or frequency.  No flank pain.  MS:  No joint pain or swelling.  No decreased range of motion.  No back pain.  Psych:  No change in mood or affect. No depression or anxiety.  No memory loss.     Objective:   Physical Exam  Filed Vitals:   08/02/12 1424  BP: 122/78  Pulse: 42  Temp: 97.9 F (36.6 C)  TempSrc: Oral  Height: 5\' 7"  (1.702 m)  Weight: 197 lb 12.8 oz (89.721 kg)  SpO2: 95%    Gen: Pleasant, well-nourished, in no distress,  normal affect  ENT: No lesions,  mouth clear,  oropharynx clear, no postnasal drip  Neck: No JVD, no TMG, no carotid bruits  Lungs: No use of accessory muscles, no dullness to percussion, distant BS  Cardiovascular: RRR, heart sounds normal, no murmur or gallops, no peripheral edema  Abdomen: soft and NT, no HSM,  BS normal  Musculoskeletal: No deformities, no cyanosis or clubbing  Neuro: alert, non focal  Skin: Warm, no lesions or rashes  PFT Conversion 12/22/2010  FVC PREDICT 3.822  FVC  % Predicted 71.79  FEV1 PREDICT 2.736  FEV % Predicted 66.68  FEV1/FVC PRE 72.158  FEV1/FVC%EXP 92.141  FEF % EXPEC 54.386        Assessment & Plan:   COPD Chronic  obstructive lung disease with asthmatic bronchitic and emphysematous components stable  at this time Plan Maintain inhaled medications as prescribed Patient advised to keep appointment with cardiology as he is having increased palpitations and some degree of dizziness Note ECG today showed PACs and is likely the source of the patient's    Updated Medication List Outpatient Encounter Prescriptions as of 08/02/2012  Medication Sig Dispense Refill  . albuterol (PROAIR HFA) 108 (90 BASE) MCG/ACT inhaler Inhale 2 puffs into the lungs every 4 (four) hours as needed.        Marland Kitchen albuterol (PROVENTIL) (2.5 MG/3ML) 0.083% nebulizer solution Take 2.5 mg by nebulization every 6 (six) hours as needed.        . carvedilol (COREG) 25 MG tablet Take 1 tablet (25 mg total) by mouth 2 (two) times daily.  180 tablet  3  . CHERATUSSIN AC 100-10 MG/5ML syrup As needed      . digoxin (LANOXIN) 0.125 MG tablet Take 125 mcg by mouth daily.        . Fluticasone-Salmeterol (ADVAIR DISKUS) 250-50 MCG/DOSE AEPB Inhale 1 puff into the lungs 2 (two) times daily.  60 each  6  . furosemide (LASIX) 40 MG tablet TAKE 1 TABLET BY MOUTH DAILY.  30 tablet  3  . guaiFENesin (MUCINEX) 600 MG 12 hr tablet Take 600 mg by mouth at bedtime.      Marland Kitchen KLOR-CON M20 20 MEQ tablet TAKE 1 TABLET BY MOUTH TWICE A DAY  60 tablet  3  . lisinopril (PRINIVIL,ZESTRIL) 2.5 MG tablet Take 1 tablet (2.5 mg total) by mouth daily.  30 tablet  6  . Magnesium Chloride (SLOW-MAG PO) Take 71.5 mg by mouth 2 (two) times daily.        . methimazole (TAPAZOLE) 10 MG tablet Take 10 mg by mouth daily.        . NON FORMULARY CPAP - at bedtime 10cmh20       . omeprazole (PRILOSEC) 20 MG capsule Take 20 mg by mouth daily as needed.       Marland Kitchen spironolactone (ALDACTONE) 25 MG tablet TAKE 1 TABLET DAILY.  30 tablet  4  . TIKOSYN 500 MCG capsule TAKE ONE CAPSULE BY MOUTH TWICE A DAY  180 capsule  1  . warfarin (COUMADIN) 4 MG tablet Take 4 mg by mouth as directed.      Marland Kitchen  DISCONTD: atorvastatin (LIPITOR) 10 MG tablet Take 1 tablet by mouth at bedtime.      Marland Kitchen DISCONTD: warfarin (COUMADIN) 5 MG tablet Take 4 mg by mouth as directed.

## 2012-08-03 ENCOUNTER — Telehealth: Payer: Self-pay | Admitting: Internal Medicine

## 2012-08-03 ENCOUNTER — Encounter: Payer: Self-pay | Admitting: Internal Medicine

## 2012-08-03 ENCOUNTER — Ambulatory Visit (INDEPENDENT_AMBULATORY_CARE_PROVIDER_SITE_OTHER): Payer: Medicare Other | Admitting: Internal Medicine

## 2012-08-03 VITALS — BP 130/68 | HR 47 | Ht 67.0 in | Wt 197.8 lb

## 2012-08-03 DIAGNOSIS — I4891 Unspecified atrial fibrillation: Secondary | ICD-10-CM

## 2012-08-03 DIAGNOSIS — I472 Ventricular tachycardia: Secondary | ICD-10-CM | POA: Diagnosis not present

## 2012-08-03 DIAGNOSIS — I509 Heart failure, unspecified: Secondary | ICD-10-CM | POA: Insufficient documentation

## 2012-08-03 LAB — ICD DEVICE OBSERVATION
AL AMPLITUDE: 3 mv
FVT: 0
RV LEAD AMPLITUDE: 6.9 mv
RV LEAD IMPEDENCE ICD: 656 Ohm
TZAT-0001ATACH: 1
TZAT-0004ATACH: 15
TZAT-0004SLOWVT: 9
TZAT-0004SLOWVT: 9
TZAT-0005FASTVT: 88 pct
TZAT-0005SLOWVT: 84 pct
TZAT-0005SLOWVT: 88 pct
TZAT-0011ATACH: 10 ms
TZAT-0011ATACH: 10 ms
TZAT-0011FASTVT: 10 ms
TZAT-0011SLOWVT: 10 ms
TZAT-0012ATACH: 150 ms
TZAT-0012ATACH: 150 ms
TZAT-0012ATACH: 150 ms
TZAT-0012FASTVT: 200 ms
TZAT-0012SLOWVT: 200 ms
TZAT-0012SLOWVT: 200 ms
TZAT-0013FASTVT: 3
TZAT-0013SLOWVT: 2
TZAT-0013SLOWVT: 2
TZAT-0018FASTVT: NEGATIVE
TZAT-0019ATACH: 6 V
TZAT-0019ATACH: 6 V
TZAT-0020ATACH: 1.5 ms
TZAT-0020ATACH: 1.5 ms
TZAT-0020SLOWVT: 1.5 ms
TZON-0003FASTVT: 250 ms
TZON-0003SLOWVT: 380 ms
TZON-0004SLOWVT: 28
TZON-0010SLOWVT: 60 ms
TZST-0001ATACH: 4
TZST-0001ATACH: 5
TZST-0001FASTVT: 3
TZST-0001FASTVT: 5
TZST-0001SLOWVT: 5
TZST-0001SLOWVT: 6
TZST-0002ATACH: NEGATIVE
TZST-0002SLOWVT: NEGATIVE
TZST-0002SLOWVT: NEGATIVE
TZST-0002SLOWVT: NEGATIVE
TZST-0003FASTVT: 35 J
TZST-0003FASTVT: 35 J
VF: 0

## 2012-08-03 MED ORDER — MEXILETINE HCL 150 MG PO CAPS: ORAL_CAPSULE | ORAL | Status: DC

## 2012-08-03 NOTE — Assessment & Plan Note (Signed)
The patient's device was interrogated.  The information was reviewed. No changes were made in the programming.    

## 2012-08-03 NOTE — Patient Instructions (Signed)
Your physician recommends that you have lab work today: bmp/cbc/magnesium/tsh  Your physician has recommended you make the following change in your medication:  1) Stop digoxin. 2) Start mexilitene 150 mg one capsule by mouth twice daily.  Remote monitoring is used to monitor your Pacemaker of ICD from home. This monitoring reduces the number of office visits required to check your device to one time per year. It allows [place] to keep an eye on the functioning of your device to ensure it is working properly. You are scheduled for a device check from home on 08/10/12 & 08/17/12. You may send your transmission at any time that day. If you have a wireless device, the transmission will be sent automatically. After your physician reviews your transmission, you will receive a postcard with your next transmission date.  Your physician recommends that you schedule a follow-up appointment in: 3 weeks with Dr. Graciela Husbands.

## 2012-08-03 NOTE — Telephone Encounter (Signed)
New msg Pt called  his pulse is low today, he said 44. He said his BP is ok. Please call

## 2012-08-03 NOTE — Telephone Encounter (Signed)
Per Dr. Graciela Husbands, the patient may be having PVC's. He will see him this afternoon for an office visit with device interrogation. The patient is aware and agreeable.

## 2012-08-03 NOTE — Telephone Encounter (Signed)
I spoke with the patient. He reports that over the last 3 days his HR has been running anywhere from 42-44 bpm. Today he has checked this and his BP is 88/50 & HR- 38. He states he feels ok- some weakness. I asked if he were having dizziness or lightheadedness and he states that a couple of days ago, he was driving and felt like he might pass out. Verified his medications. He has taken his medications this morning except for his digoxin. He had an office visit with Dr. Delford Field yesterday and had an EKG done. This is in EPIC. Advised I would forward this message to Dr. Graciela Husbands to review and advise. I will call him back with recommendations. He is agreeable.

## 2012-08-03 NOTE — Assessment & Plan Note (Signed)
Chronic obstructive lung disease with asthmatic bronchitic and emphysematous components stable at this time Plan Maintain inhaled medications as prescribed Patient advised to keep appointment with cardiology as he is having increased palpitations and some degree of dizziness Note ECG today showed PACs and is likely the source of the patient's

## 2012-08-03 NOTE — Assessment & Plan Note (Signed)
As above.

## 2012-08-03 NOTE — Assessment & Plan Note (Signed)
No recurrent atrial fibrillation; we will stop his digoxin

## 2012-08-03 NOTE — Progress Notes (Signed)
HPI  Cody Gomez is a 78 y.o. male Seen in followup for nonischemic myopathy and ventricular tachycardia as well as atrial fibrillation. He was started on Tikosyn for drug suppression. He comes in today with complaints of lightheadedness low blood pressure and low heart rates.  He has had spells of recurrent presyncope it occurred the other day. They're quite brief period   he denies chest pain shortness of breath. He has had some problems with fatigue  Previous antiarrhythmic drugs including amiodarone which caused hyperthyroidism for which he is still under therapy   Past Medical History  Diagnosis Date  . Nonischemic dilated cardiomyopathy     Catheterization 2008 demonstrated 40% LAD  . Ventricular tachycardia     Shock therapy delivered to the his ICD; previous failure to tolerate amiodarone and dronaderone  . ICD (implantable cardiac defibrillator) in place 10/29/10    Medtronic-dual-chamber  . GERD (gastroesophageal reflux disease)   . HLD (hyperlipidemia)   . HTN (hypertension)   . Atrial fibrillation     chronic coumadin   . History of prostate cancer   . Hyperthyroidism     Past Surgical History  Procedure Date  . Prostatectomy   . Pacemaker insertion     medtronic  . Appendectomy   . Hernia repair     Current Outpatient Prescriptions  Medication Sig Dispense Refill  . albuterol (PROAIR HFA) 108 (90 BASE) MCG/ACT inhaler Inhale 2 puffs into the lungs every 4 (four) hours as needed.        Marland Kitchen albuterol (PROVENTIL) (2.5 MG/3ML) 0.083% nebulizer solution Take 2.5 mg by nebulization every 6 (six) hours as needed.        . carvedilol (COREG) 25 MG tablet Take 1 tablet (25 mg total) by mouth 2 (two) times daily.  180 tablet  3  . CHERATUSSIN AC 100-10 MG/5ML syrup As needed      . digoxin (LANOXIN) 0.125 MG tablet Take 125 mcg by mouth daily.        . Fluticasone-Salmeterol (ADVAIR DISKUS) 250-50 MCG/DOSE AEPB Inhale 1 puff into the lungs 2 (two) times daily.  60  each  6  . furosemide (LASIX) 40 MG tablet TAKE 1 TABLET BY MOUTH DAILY.  30 tablet  3  . guaiFENesin (MUCINEX) 600 MG 12 hr tablet Take 600 mg by mouth at bedtime.      Marland Kitchen KLOR-CON M20 20 MEQ tablet TAKE 1 TABLET BY MOUTH TWICE A DAY  60 tablet  3  . lisinopril (PRINIVIL,ZESTRIL) 2.5 MG tablet Take 1 tablet (2.5 mg total) by mouth daily.  30 tablet  6  . Magnesium Chloride (SLOW-MAG PO) Take 71.5 mg by mouth 2 (two) times daily.        . methimazole (TAPAZOLE) 10 MG tablet Take 10 mg by mouth daily.        . NON FORMULARY CPAP - at bedtime 10cmh20       . omeprazole (PRILOSEC) 20 MG capsule Take 20 mg by mouth daily as needed.       Marland Kitchen spironolactone (ALDACTONE) 25 MG tablet TAKE 1 TABLET DAILY.  30 tablet  4  . TIKOSYN 500 MCG capsule TAKE ONE CAPSULE BY MOUTH TWICE A DAY  180 capsule  1  . warfarin (COUMADIN) 4 MG tablet Take 4 mg by mouth as directed.        Allergies  Allergen Reactions  . Dobutamine   . Penicillins     REACTION: swelling    Review of Systems  negative except from HPI and PMH  Physical Exam BP 130/68  Pulse 47  Ht 5\' 7"  (1.702 m)  Wt 197 lb 12.8 oz (89.721 kg)  BMI 30.98 kg/m2 Well developed and well nourished in no acute distress HENT normal E scleral and icterus clear Neck Supple JVP flat; carotids brisk and full Clear to ausculation irRegular rate and rhythm, no murmurs gallops or rub Soft with active bowel sounds No clubbing cyanosis none Edema Alert and oriented, grossly normal motor and sensory function Skin Warm and Dry    Assessment and  Plan  o

## 2012-08-03 NOTE — Assessment & Plan Note (Addendum)
He has had recurrent monomorphic ventricular tachycardia treated with antitachycardia pacing. It has been associated with presyncope. There has been a marked increase in ventricular burden over the last 2-3 months comparatively preceding months. I do not have a good explanation for that. We will check a metabolic profile, CBC, magnesium and TSH.  We'll also begin him on mexiletine 150 mg twice daily to take in conjunction with his dofetilide. I reviewed side effects.  He is further advised not to drive We will review his [facility] tracings weekly next couple of weeks.  He is a nonischemic myopathy at this point I don't see any particular reason for her taking catheterization.

## 2012-08-04 DIAGNOSIS — Z7901 Long term (current) use of anticoagulants: Secondary | ICD-10-CM | POA: Diagnosis not present

## 2012-08-04 LAB — CBC WITH DIFFERENTIAL/PLATELET
Basophils Relative: 0.1 % (ref 0.0–3.0)
Eosinophils Absolute: 0.4 10*3/uL (ref 0.0–0.7)
Eosinophils Relative: 4.8 % (ref 0.0–5.0)
HCT: 42.2 % (ref 39.0–52.0)
Hemoglobin: 13.9 g/dL (ref 13.0–17.0)
Lymphs Abs: 2.7 10*3/uL (ref 0.7–4.0)
MCHC: 32.8 g/dL (ref 30.0–36.0)
MCV: 92.9 fl (ref 78.0–100.0)
Monocytes Absolute: 0.6 10*3/uL (ref 0.1–1.0)
Neutro Abs: 5.5 10*3/uL (ref 1.4–7.7)
Neutrophils Relative %: 59.3 % (ref 43.0–77.0)
RBC: 4.55 Mil/uL (ref 4.22–5.81)
WBC: 9.2 10*3/uL (ref 4.5–10.5)

## 2012-08-04 LAB — BASIC METABOLIC PANEL
BUN: 20 mg/dL (ref 6–23)
CO2: 27 mEq/L (ref 19–32)
GFR: 77.46 mL/min (ref >60.00)
Glucose, Bld: 86 mg/dL (ref 70–99)
Potassium: 4.9 mEq/L (ref 3.5–5.1)

## 2012-08-10 ENCOUNTER — Encounter: Payer: Medicare Other | Admitting: *Deleted

## 2012-08-10 ENCOUNTER — Telehealth: Payer: Self-pay | Admitting: Internal Medicine

## 2012-08-10 NOTE — Telephone Encounter (Signed)
Pt rtn call 

## 2012-08-10 NOTE — Telephone Encounter (Signed)
I spoke with the patient. 

## 2012-08-10 NOTE — Telephone Encounter (Signed)
Follow-up:    Patient returned your call.  Please call back. 

## 2012-08-11 ENCOUNTER — Encounter: Payer: Medicare Other | Admitting: Internal Medicine

## 2012-08-17 ENCOUNTER — Ambulatory Visit (INDEPENDENT_AMBULATORY_CARE_PROVIDER_SITE_OTHER): Payer: Medicare Other | Admitting: *Deleted

## 2012-08-17 DIAGNOSIS — I255 Ischemic cardiomyopathy: Secondary | ICD-10-CM

## 2012-08-17 DIAGNOSIS — I2589 Other forms of chronic ischemic heart disease: Secondary | ICD-10-CM

## 2012-08-18 ENCOUNTER — Encounter: Payer: Self-pay | Admitting: Internal Medicine

## 2012-08-18 DIAGNOSIS — R0989 Other specified symptoms and signs involving the circulatory and respiratory systems: Secondary | ICD-10-CM

## 2012-08-21 LAB — REMOTE ICD DEVICE
AL AMPLITUDE: 2.2 mv
AL IMPEDENCE ICD: 416 Ohm
ATRIAL PACING ICD: 99.22 pct
CHARGE TIME: 11.491 s
RV LEAD IMPEDENCE ICD: 744 Ohm
TOT-0001: 10
TOT-0006: 20080805000000
TZAT-0001FASTVT: 1
TZAT-0001SLOWVT: 1
TZAT-0001SLOWVT: 2
TZAT-0002ATACH: NEGATIVE
TZAT-0004ATACH: 15
TZAT-0004FASTVT: 8
TZAT-0004SLOWVT: 9
TZAT-0005FASTVT: 88 pct
TZAT-0011ATACH: 10 ms
TZAT-0012ATACH: 150 ms
TZAT-0012SLOWVT: 200 ms
TZAT-0012SLOWVT: 200 ms
TZAT-0013FASTVT: 3
TZAT-0018FASTVT: NEGATIVE
TZAT-0019ATACH: 6 V
TZAT-0019ATACH: 6 V
TZAT-0019SLOWVT: 8 V
TZAT-0019SLOWVT: 8 V
TZAT-0020ATACH: 1.5 ms
TZAT-0020ATACH: 1.5 ms
TZAT-0020FASTVT: 1.5 ms
TZAT-0020SLOWVT: 1.5 ms
TZAT-0020SLOWVT: 1.5 ms
TZON-0003ATACH: 350 ms
TZON-0003VSLOWVT: 450 ms
TZON-0004VSLOWVT: 32
TZON-0010FASTVT: 60 ms
TZON-0010SLOWVT: 60 ms
TZST-0001ATACH: 5
TZST-0001FASTVT: 2
TZST-0001FASTVT: 4
TZST-0001SLOWVT: 3
TZST-0001SLOWVT: 4
TZST-0001SLOWVT: 6
TZST-0002ATACH: NEGATIVE
TZST-0002ATACH: NEGATIVE
TZST-0003FASTVT: 35 J
TZST-0003FASTVT: 35 J
TZST-0003FASTVT: 35 J
VENTRICULAR PACING ICD: 6.7 pct
VF: 0

## 2012-08-25 ENCOUNTER — Encounter: Payer: Medicare Other | Admitting: Internal Medicine

## 2012-08-28 ENCOUNTER — Other Ambulatory Visit: Payer: Self-pay | Admitting: Internal Medicine

## 2012-08-28 DIAGNOSIS — N390 Urinary tract infection, site not specified: Secondary | ICD-10-CM | POA: Diagnosis not present

## 2012-08-28 DIAGNOSIS — R5383 Other fatigue: Secondary | ICD-10-CM | POA: Diagnosis not present

## 2012-08-28 DIAGNOSIS — C61 Malignant neoplasm of prostate: Secondary | ICD-10-CM | POA: Diagnosis not present

## 2012-08-28 DIAGNOSIS — R3129 Other microscopic hematuria: Secondary | ICD-10-CM | POA: Diagnosis not present

## 2012-08-28 DIAGNOSIS — R6889 Other general symptoms and signs: Secondary | ICD-10-CM | POA: Diagnosis not present

## 2012-08-28 DIAGNOSIS — R319 Hematuria, unspecified: Secondary | ICD-10-CM | POA: Diagnosis not present

## 2012-08-30 ENCOUNTER — Other Ambulatory Visit: Payer: Self-pay

## 2012-08-30 MED ORDER — SPIRONOLACTONE 25 MG PO TABS: 25.0000 mg | ORAL_TABLET | Freq: Every day | ORAL | Status: DC

## 2012-09-04 DIAGNOSIS — Z23 Encounter for immunization: Secondary | ICD-10-CM | POA: Diagnosis not present

## 2012-09-04 DIAGNOSIS — Z7901 Long term (current) use of anticoagulants: Secondary | ICD-10-CM | POA: Diagnosis not present

## 2012-09-11 DIAGNOSIS — R3129 Other microscopic hematuria: Secondary | ICD-10-CM | POA: Diagnosis not present

## 2012-09-11 DIAGNOSIS — N390 Urinary tract infection, site not specified: Secondary | ICD-10-CM | POA: Diagnosis not present

## 2012-09-11 DIAGNOSIS — C61 Malignant neoplasm of prostate: Secondary | ICD-10-CM | POA: Diagnosis not present

## 2012-09-12 DIAGNOSIS — L821 Other seborrheic keratosis: Secondary | ICD-10-CM | POA: Diagnosis not present

## 2012-09-12 DIAGNOSIS — L570 Actinic keratosis: Secondary | ICD-10-CM | POA: Diagnosis not present

## 2012-09-12 DIAGNOSIS — D239 Other benign neoplasm of skin, unspecified: Secondary | ICD-10-CM | POA: Diagnosis not present

## 2012-09-12 DIAGNOSIS — L578 Other skin changes due to chronic exposure to nonionizing radiation: Secondary | ICD-10-CM | POA: Diagnosis not present

## 2012-09-18 DIAGNOSIS — Z7901 Long term (current) use of anticoagulants: Secondary | ICD-10-CM | POA: Diagnosis not present

## 2012-09-25 ENCOUNTER — Other Ambulatory Visit: Payer: Self-pay | Admitting: Internal Medicine

## 2012-09-26 ENCOUNTER — Encounter: Payer: Self-pay | Admitting: Internal Medicine

## 2012-09-26 ENCOUNTER — Ambulatory Visit (INDEPENDENT_AMBULATORY_CARE_PROVIDER_SITE_OTHER): Payer: Medicare Other | Admitting: Internal Medicine

## 2012-09-26 VITALS — BP 115/66 | HR 73 | Ht 67.0 in | Wt 202.0 lb

## 2012-09-26 DIAGNOSIS — I509 Heart failure, unspecified: Secondary | ICD-10-CM

## 2012-09-26 DIAGNOSIS — I4891 Unspecified atrial fibrillation: Secondary | ICD-10-CM

## 2012-09-26 DIAGNOSIS — I428 Other cardiomyopathies: Secondary | ICD-10-CM | POA: Diagnosis not present

## 2012-09-26 DIAGNOSIS — I472 Ventricular tachycardia, unspecified: Secondary | ICD-10-CM

## 2012-09-26 DIAGNOSIS — Z95810 Presence of automatic (implantable) cardiac defibrillator: Secondary | ICD-10-CM

## 2012-09-26 LAB — ICD DEVICE OBSERVATION
AL AMPLITUDE: 3.1518 mv
AL IMPEDENCE ICD: 432 Ohm
AL THRESHOLD: 0.5 V
BATTERY VOLTAGE: 2.78 V
DEV-0020ICD: NEGATIVE
RV LEAD IMPEDENCE ICD: 840 Ohm
RV LEAD THRESHOLD: 0.5 V
TOT-0006: 20080805000000
TZAT-0001ATACH: 1
TZAT-0001ATACH: 2
TZAT-0001ATACH: 3
TZAT-0001SLOWVT: 1
TZAT-0001SLOWVT: 2
TZAT-0004SLOWVT: 9
TZAT-0004SLOWVT: 9
TZAT-0005FASTVT: 88 pct
TZAT-0005SLOWVT: 84 pct
TZAT-0005SLOWVT: 88 pct
TZAT-0011ATACH: 10 ms
TZAT-0011FASTVT: 10 ms
TZAT-0012ATACH: 150 ms
TZAT-0012ATACH: 150 ms
TZAT-0013SLOWVT: 2
TZAT-0013SLOWVT: 2
TZAT-0018ATACH: NEGATIVE
TZAT-0020ATACH: 1.5 ms
TZAT-0020ATACH: 1.5 ms
TZON-0003ATACH: 350 ms
TZON-0004SLOWVT: 40
TZON-0004VSLOWVT: 44
TZST-0001ATACH: 4
TZST-0001ATACH: 6
TZST-0001FASTVT: 3
TZST-0001FASTVT: 5
TZST-0001FASTVT: 6
TZST-0001SLOWVT: 3
TZST-0001SLOWVT: 4
TZST-0001SLOWVT: 5
TZST-0002ATACH: NEGATIVE
TZST-0002SLOWVT: NEGATIVE
TZST-0002SLOWVT: NEGATIVE
TZST-0003FASTVT: 35 J
TZST-0003FASTVT: 35 J
TZST-0003FASTVT: 35 J
VENTRICULAR PACING ICD: 2.76 pct
VF: 0

## 2012-09-26 NOTE — Assessment & Plan Note (Signed)
As above.

## 2012-09-26 NOTE — Assessment & Plan Note (Signed)
Patient has had recurrent ventricular tachycardia both above and below his detection rate. His ICD was reprogrammed to a detection rate of 420 ms. We also increased his in ID from 24--40. He is tolerating his medications. We also discussed the role of catheter ablation. We reviewed the potential issues related to ablation and a nonischemic myopathy

## 2012-09-26 NOTE — Assessment & Plan Note (Signed)
Continue Guideline directed medical therapy  

## 2012-09-26 NOTE — Progress Notes (Signed)
Patient Care Team: Desmond Dike as PCP - General (Unknown Physician Specialty) Storm Frisk, MD (Pulmonary Disease)   HPI  Cody Gomez is a 78 y.o. male Seen in followup for nonischemic myopathy and ventricular tachycardia as well as atrial fibrillation. He was started on Tikosyn for drug suppression.  He comes in today with complaints of lightheadedness low blood pressure and low heart rates. He continued to have episodes of ventricular tachycardia manifested as lightheadedness and we at his last visit added mexiletine.  Blood work was normal for take [place] in in August with a K. of 4.9 and magnesium of 2.3 the rest of the medication he is doing pretty well in part from the cough.  Past Medical History  Diagnosis Date  . Nonischemic dilated cardiomyopathy     Catheterization 2008 demonstrated 40% LAD  . Ventricular tachycardia     Shock therapy delivered to the his ICD; previous failure to tolerate amiodarone and dronaderone  . ICD (implantable cardiac defibrillator) in place 10/29/10    Medtronic-dual-chamber  . GERD (gastroesophageal reflux disease)   . HLD (hyperlipidemia)   . HTN (hypertension)   . Atrial fibrillation     chronic coumadin   . History of prostate cancer   . Hyperthyroidism     Past Surgical History  Procedure Date  . Prostatectomy   . Pacemaker insertion     medtronic  . Appendectomy   . Hernia repair     Current Outpatient Prescriptions  Medication Sig Dispense Refill  . albuterol (PROAIR HFA) 108 (90 BASE) MCG/ACT inhaler Inhale 2 puffs into the lungs every 4 (four) hours as needed.        Marland Kitchen albuterol (PROVENTIL) (2.5 MG/3ML) 0.083% nebulizer solution Take 2.5 mg by nebulization every 6 (six) hours as needed.        . carvedilol (COREG) 25 MG tablet Take 1 tablet (25 mg total) by mouth 2 (two) times daily.  180 tablet  3  . CHERATUSSIN AC 100-10 MG/5ML syrup As needed      . Fluticasone-Salmeterol (ADVAIR DISKUS) 250-50 MCG/DOSE AEPB Inhale 1 puff  into the lungs 2 (two) times daily.  60 each  6  . furosemide (LASIX) 40 MG tablet TAKE 1 TABLET BY MOUTH DAILY.  30 tablet  3  . guaiFENesin (MUCINEX) 600 MG 12 hr tablet Take 600 mg by mouth at bedtime.      Marland Kitchen KLOR-CON M20 20 MEQ tablet TAKE 1 TABLET BY MOUTH TWICE A DAY  60 tablet  3  . lisinopril (PRINIVIL,ZESTRIL) 2.5 MG tablet Take 1 tablet (2.5 mg total) by mouth daily.  30 tablet  6  . Magnesium Chloride (SLOW-MAG PO) Take 71.5 mg by mouth 2 (two) times daily.        . methimazole (TAPAZOLE) 10 MG tablet Take 10 mg by mouth daily.        Marland Kitchen mexiletine (MEXITIL) 150 MG capsule Take one capsule by mouth twice daily  60 capsule  6  . NON FORMULARY CPAP - at bedtime 10cmh20       . omeprazole (PRILOSEC) 20 MG capsule Take 20 mg by mouth daily as needed.       Marland Kitchen spironolactone (ALDACTONE) 25 MG tablet Take 1 tablet (25 mg total) by mouth daily.  90 tablet  3  . TIKOSYN 500 MCG capsule TAKE ONE CAPSULE BY MOUTH TWICE A DAY  180 capsule  1  . warfarin (COUMADIN) 4 MG tablet Take 4 mg by mouth as directed.  Allergies  Allergen Reactions  . Dobutamine   . Penicillins     REACTION: swelling    Review of Systems negative except from HPI and PMH  Physical Exam BP 115/66  Pulse 73  Ht 5\' 7"  (1.702 m)  Wt 202 lb (91.627 kg)  BMI 31.64 kg/m2 Well developed and well nourished in no acute distress HENT normal E scleral and icterus clear Neck Supple JVP flat; carotids brisk and full Clear to ausculation Regular rate and rhythm, no murmurs gallops or rub Soft with active bowel sounds No clubbing cyanosis none Edema Alert and oriented, grossly normal motor and sensory function Skin Warm and Dry    Assessment and  Plan

## 2012-09-26 NOTE — Patient Instructions (Addendum)
Your physician recommends that you schedule a follow-up appointment with Dr. Graciela Husbands in December.

## 2012-10-04 ENCOUNTER — Encounter: Payer: Self-pay | Admitting: Internal Medicine

## 2012-10-20 DIAGNOSIS — Z7901 Long term (current) use of anticoagulants: Secondary | ICD-10-CM | POA: Diagnosis not present

## 2012-10-23 DIAGNOSIS — J209 Acute bronchitis, unspecified: Secondary | ICD-10-CM | POA: Diagnosis not present

## 2012-11-15 ENCOUNTER — Encounter: Payer: Self-pay | Admitting: Critical Care Medicine

## 2012-11-15 ENCOUNTER — Ambulatory Visit (INDEPENDENT_AMBULATORY_CARE_PROVIDER_SITE_OTHER): Payer: Medicare Other | Admitting: Critical Care Medicine

## 2012-11-15 VITALS — BP 132/72 | HR 78 | Temp 97.4°F | Ht 67.0 in | Wt 204.4 lb

## 2012-11-15 DIAGNOSIS — J449 Chronic obstructive pulmonary disease, unspecified: Secondary | ICD-10-CM

## 2012-11-15 MED ORDER — FLUTICASONE-SALMETEROL 250-50 MCG/DOSE IN AEPB: 1.0000 | INHALATION_SPRAY | Freq: Two times a day (BID) | RESPIRATORY_TRACT | Status: DC

## 2012-11-15 MED ORDER — PREDNISONE 10 MG PO TABS: ORAL_TABLET | ORAL | Status: DC

## 2012-11-15 NOTE — Progress Notes (Signed)
Subjective:    Patient ID: Cody Gomez, male    DOB: 1931-07-21, 78 y.o.   MRN: [id number]  Shortness of Breath   78 y.o. WM with Copd and reversable components  11/15/2012 Pt declined over the past week.  Pt notes more mucus, white. No blood.  More dyspneic . No real chest pain.  No edema in feet.  Notes some pndrip.   No heartburn or indigestion.   Past Medical History  Diagnosis Date  . Nonischemic dilated cardiomyopathy     Catheterization 2008 demonstrated 40% LAD  . Ventricular tachycardia     Shock therapy delivered to the his ICD; previous failure to tolerate amiodarone and dronaderone  . ICD (implantable cardiac defibrillator) in place 10/29/10    Medtronic-dual-chamber  . GERD (gastroesophageal reflux disease)   . HLD (hyperlipidemia)   . HTN (hypertension)   . Atrial fibrillation     chronic coumadin   . History of prostate cancer   . Hyperthyroidism      Family History  Problem Relation Age of Onset  . Heart disease Mother   . Cancer Father     bladder      History   Social History  . Marital Status: Single    Spouse Name: N/A    Number of Children: N/A  . Years of Education: N/A   Occupational History  . Not on file.   Social History Main Topics  . Smoking status: Former Smoker -- 2.0 packs/day for 38 years    Types: Cigarettes, Pipe, Cigars    Quit date: 12/13/1982  . Smokeless tobacco: Former Neurosurgeon    Types: Chew    Quit date: 12/14/1983     Comment: started when 14; quit in 1984, up to 2 ppd.   . Alcohol Use: Not on file  . Drug Use: Not on file  . Sexually Active: Not on file   Other Topics Concern  . Not on file   Social History Narrative   Married, 1 son; retired Naval architect.      Allergies  Allergen Reactions  . Dobutamine   . Penicillins     REACTION: swelling     Outpatient Prescriptions Prior to Visit  Medication Sig Dispense Refill  . albuterol (PROAIR HFA) 108 (90 BASE) MCG/ACT inhaler Inhale 2 puffs into the lungs  every 4 (four) hours as needed.        Marland Kitchen albuterol (PROVENTIL) (2.5 MG/3ML) 0.083% nebulizer solution Take 2.5 mg by nebulization every 6 (six) hours as needed.        . carvedilol (COREG) 25 MG tablet Take 1 tablet (25 mg total) by mouth 2 (two) times daily.  180 tablet  3  . CHERATUSSIN AC 100-10 MG/5ML syrup As needed      . furosemide (LASIX) 40 MG tablet TAKE 1 TABLET BY MOUTH DAILY.  30 tablet  3  . guaiFENesin (MUCINEX) 600 MG 12 hr tablet Take 600 mg by mouth at bedtime.      Marland Kitchen KLOR-CON M20 20 MEQ tablet TAKE 1 TABLET BY MOUTH TWICE A DAY  60 tablet  3  . lisinopril (PRINIVIL,ZESTRIL) 2.5 MG tablet Take 1 tablet (2.5 mg total) by mouth daily.  30 tablet  6  . Magnesium Chloride (SLOW-MAG PO) Take 71.5 mg by mouth 2 (two) times daily.        . methimazole (TAPAZOLE) 10 MG tablet Take 10 mg by mouth daily.        Marland Kitchen mexiletine (MEXITIL) 150  MG capsule Take one capsule by mouth twice daily  60 capsule  6  . NON FORMULARY CPAP - at bedtime 10cmh20       . spironolactone (ALDACTONE) 25 MG tablet Take 1 tablet (25 mg total) by mouth daily.  90 tablet  3  . TIKOSYN 500 MCG capsule TAKE ONE CAPSULE BY MOUTH TWICE A DAY  180 capsule  1  . warfarin (COUMADIN) 4 MG tablet Take 4 mg by mouth as directed.      . Fluticasone-Salmeterol (ADVAIR DISKUS) 250-50 MCG/DOSE AEPB Inhale 1 puff into the lungs 2 (two) times daily.  60 each  6  . omeprazole (PRILOSEC) 20 MG capsule Take 20 mg by mouth daily as needed.           Review of Systems  Respiratory: Positive for shortness of breath.    Constitutional:   No  weight loss, night sweats,  Fevers, chills, fatigue, lassitude. HEENT:   No headaches,  Difficulty swallowing,  Tooth/dental problems,  Sore throat,                No sneezing, itching, ear ache, nasal congestion, post nasal drip,   CV:  No chest pain,  Orthopnea, PND, swelling in lower extremities, anasarca, dizziness, palpitations  GI  No heartburn, indigestion, abdominal pain, nausea,  vomiting, diarrhea, change in bowel habits, loss of appetite  Resp: Notes  shortness of breath with exertion not at rest.  Notes excess mucus, notes productive cough,  No non-productive cough,  No coughing up of blood.  No change in color of mucus.  No wheezing.  No chest wall deformity  Skin: no rash or lesions.  GU: no dysuria, change in color of urine, no urgency or frequency.  No flank pain.  MS:  No joint pain or swelling.  No decreased range of motion.  No back pain.  Psych:  No change in mood or affect. No depression or anxiety.  No memory loss.     Objective:   Physical Exam  Filed Vitals:   11/15/12 1623  BP: 132/72  Pulse: 78  Temp: 97.4 F (36.3 C)  TempSrc: Oral  Height: 5\' 7"  (1.702 m)  Weight: 204 lb 6.4 oz (92.715 kg)  SpO2: 94%    Gen: Pleasant, well-nourished, in no distress,  normal affect  ENT: No lesions,  mouth clear,  oropharynx clear, no postnasal drip  Neck: No JVD, no TMG, no carotid bruits  Lungs: No use of accessory muscles, no dullness to percussion, distant BS, expired wheezes poor airflow  Cardiovascular: RRR, heart sounds normal, no murmur or gallops, no peripheral edema  Abdomen: soft and NT, no HSM,  BS normal  Musculoskeletal: No deformities, no cyanosis or clubbing  Neuro: alert, non focal  Skin: Warm, no lesions or rashes       Assessment & Plan:   COPD Gold stage C. COPD now with exacerbation due to the fact the patient had stopped and chronic inhaled medications Plan Prednisone 10mg  Take 4 tablets daily for 5 days then stop Resume Advair one puff twice daily, use samples Return 4 months     Updated Medication List Outpatient Encounter Prescriptions as of 11/15/2012  Medication Sig Dispense Refill  . albuterol (PROAIR HFA) 108 (90 BASE) MCG/ACT inhaler Inhale 2 puffs into the lungs every 4 (four) hours as needed.        Marland Kitchen albuterol (PROVENTIL) (2.5 MG/3ML) 0.083% nebulizer solution Take 2.5 mg by nebulization every 6  (six) hours as  needed.        . carvedilol (COREG) 25 MG tablet Take 1 tablet (25 mg total) by mouth 2 (two) times daily.  180 tablet  3  . CHERATUSSIN AC 100-10 MG/5ML syrup As needed      . furosemide (LASIX) 40 MG tablet TAKE 1 TABLET BY MOUTH DAILY.  30 tablet  3  . guaiFENesin (MUCINEX) 600 MG 12 hr tablet Take 600 mg by mouth at bedtime.      Marland Kitchen KLOR-CON M20 20 MEQ tablet TAKE 1 TABLET BY MOUTH TWICE A DAY  60 tablet  3  . lisinopril (PRINIVIL,ZESTRIL) 2.5 MG tablet Take 1 tablet (2.5 mg total) by mouth daily.  30 tablet  6  . Magnesium Chloride (SLOW-MAG PO) Take 71.5 mg by mouth 2 (two) times daily.        . methimazole (TAPAZOLE) 10 MG tablet Take 10 mg by mouth daily.        Marland Kitchen mexiletine (MEXITIL) 150 MG capsule Take one capsule by mouth twice daily  60 capsule  6  . NON FORMULARY CPAP - at bedtime 10cmh20       . spironolactone (ALDACTONE) 25 MG tablet Take 1 tablet (25 mg total) by mouth daily.  90 tablet  3  . TIKOSYN 500 MCG capsule TAKE ONE CAPSULE BY MOUTH TWICE A DAY  180 capsule  1  . warfarin (COUMADIN) 4 MG tablet Take 4 mg by mouth as directed.      . Fluticasone-Salmeterol (ADVAIR DISKUS) 250-50 MCG/DOSE AEPB Inhale 1 puff into the lungs 2 (two) times daily.  14 each  6  . predniSONE (DELTASONE) 10 MG tablet Take 4 tablets daily for 5 days then stop  20 tablet  0  . [DISCONTINUED] Fluticasone-Salmeterol (ADVAIR DISKUS) 250-50 MCG/DOSE AEPB Inhale 1 puff into the lungs 2 (two) times daily.  60 each  6  . [DISCONTINUED] omeprazole (PRILOSEC) 20 MG capsule Take 20 mg by mouth daily as needed.

## 2012-11-15 NOTE — Patient Instructions (Addendum)
Prednisone 10mg  Take 4 tablets daily for 5 days then stop Resume Advair one puff twice daily, use samples Return 4 months

## 2012-11-16 NOTE — Assessment & Plan Note (Signed)
Gold stage C. COPD now with exacerbation due to the fact the patient had stopped and chronic inhaled medications Plan Prednisone 10mg  Take 4 tablets daily for 5 days then stop Resume Advair one puff twice daily, use samples Return 4 months

## 2012-11-17 DIAGNOSIS — Z7901 Long term (current) use of anticoagulants: Secondary | ICD-10-CM | POA: Diagnosis not present

## 2012-11-28 ENCOUNTER — Ambulatory Visit (INDEPENDENT_AMBULATORY_CARE_PROVIDER_SITE_OTHER): Payer: Medicare Other | Admitting: Internal Medicine

## 2012-11-28 ENCOUNTER — Encounter: Payer: Self-pay | Admitting: Internal Medicine

## 2012-11-28 VITALS — BP 106/69 | HR 75 | Wt 205.0 lb

## 2012-11-28 DIAGNOSIS — I509 Heart failure, unspecified: Secondary | ICD-10-CM

## 2012-11-28 DIAGNOSIS — I472 Ventricular tachycardia: Secondary | ICD-10-CM | POA: Diagnosis not present

## 2012-11-28 DIAGNOSIS — Z4502 Encounter for adjustment and management of automatic implantable cardiac defibrillator: Secondary | ICD-10-CM | POA: Insufficient documentation

## 2012-11-28 DIAGNOSIS — I4891 Unspecified atrial fibrillation: Secondary | ICD-10-CM | POA: Diagnosis not present

## 2012-11-28 DIAGNOSIS — I428 Other cardiomyopathies: Secondary | ICD-10-CM

## 2012-11-28 LAB — ICD DEVICE OBSERVATION
AL AMPLITUDE: 2.8 mv
ATRIAL PACING ICD: 98.84 pct
BAMS-0001: 170 {beats}/min
CHARGE TIME: 11.491 s
FVT: 0
PACEART VT: 21
RV LEAD AMPLITUDE: 7.5 mv
RV LEAD IMPEDENCE ICD: 816 Ohm
TOT-0001: 10
TOT-0002: 3
TZAT-0001ATACH: 3
TZAT-0001FASTVT: 1
TZAT-0002ATACH: NEGATIVE
TZAT-0004ATACH: 7
TZAT-0011ATACH: 10 ms
TZAT-0011SLOWVT: 10 ms
TZAT-0011SLOWVT: 10 ms
TZAT-0012FASTVT: 200 ms
TZAT-0012SLOWVT: 200 ms
TZAT-0013FASTVT: 3
TZAT-0018ATACH: NEGATIVE
TZAT-0018ATACH: NEGATIVE
TZAT-0018SLOWVT: NEGATIVE
TZAT-0018SLOWVT: NEGATIVE
TZAT-0019ATACH: 6 V
TZAT-0019ATACH: 6 V
TZAT-0019FASTVT: 8 V
TZAT-0019SLOWVT: 8 V
TZAT-0019SLOWVT: 8 V
TZAT-0020FASTVT: 1.5 ms
TZAT-0020SLOWVT: 1.5 ms
TZON-0003VSLOWVT: 450 ms
TZON-0005SLOWVT: 16
TZON-0010FASTVT: 60 ms
TZON-0010SLOWVT: 60 ms
TZST-0001ATACH: 5
TZST-0001ATACH: 6
TZST-0001FASTVT: 2
TZST-0001FASTVT: 4
TZST-0001FASTVT: 6
TZST-0001SLOWVT: 6
TZST-0002ATACH: NEGATIVE
TZST-0002ATACH: NEGATIVE
TZST-0002SLOWVT: NEGATIVE
TZST-0002SLOWVT: NEGATIVE
TZST-0003FASTVT: 35 J
TZST-0003FASTVT: 35 J

## 2012-11-28 NOTE — Progress Notes (Signed)
Patient Care Team: Desmond Dike as PCP - General (Unknown Physician Specialty) Storm Frisk, MD (Pulmonary Disease)   HPI  Cody Gomez is a 78 y.o. male  en in followup for nonischemic myopathy and ventricular tachycardia as well as atrial fibrillation. He was started on Tikosyn for drug suppression.    He's been having ventricular tachycardia above and below his detection rate so was decreased to 420  Since being here in October, he has had 20 episodes of ventricular tach cardia and of these Has occurred in the last 4 weeks.  He also complains of fatigue; and fatigue primarily follows days of high-energy output. 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 Nonischemic dilated cardiomyopathy     Catheterization 2008 demonstrated 40% LAD  . Ventricular tachycardia     Shock therapy delivered to the his ICD; previous failure to tolerate amiodarone and dronaderone  . ICD (implantable cardiac defibrillator) in place 10/29/10    Medtronic-dual-chamber  . GERD (gastroesophageal reflux disease)   . HLD (hyperlipidemia)   . HTN (hypertension)   . Atrial fibrillation     chronic coumadin   . History of prostate cancer   . Hyperthyroidism     Past Surgical History  Procedure Date  . Prostatectomy   . Pacemaker insertion     medtronic  . Appendectomy   . Hernia repair     Current Outpatient Prescriptions  Medication Sig Dispense Refill  . albuterol (PROVENTIL) (2.5 MG/3ML) 0.083% nebulizer solution Take 2.5 mg by nebulization every 6 (six) hours as needed.        . carvedilol (COREG) 25 MG tablet Take 1 tablet (25 mg total) by mouth 2 (two) times daily.  180 tablet  3  . CHERATUSSIN AC 100-10 MG/5ML syrup As needed      . Fluticasone-Salmeterol (ADVAIR DISKUS) 250-50 MCG/DOSE AEPB Inhale 1 puff into the lungs 2 (two) times daily.  14 each  6  . furosemide (LASIX) 40 MG tablet TAKE 1 TABLET BY  MOUTH DAILY.  30 tablet  3  . guaiFENesin (MUCINEX) 600 MG 12 hr tablet Take 600 mg by mouth at bedtime.      Marland Kitchen KLOR-CON M20 20 MEQ tablet TAKE 1 TABLET BY MOUTH TWICE A DAY  60 tablet  3  . lisinopril (PRINIVIL,ZESTRIL) 2.5 MG tablet Take 1 tablet (2.5 mg total) by mouth daily.  30 tablet  6  . Magnesium Chloride (SLOW-MAG PO) Take 71.5 mg by mouth 2 (two) times daily.        . methimazole (TAPAZOLE) 10 MG tablet Take 10 mg by mouth daily.        Marland Kitchen mexiletine (MEXITIL) 150 MG capsule Take one capsule by mouth twice daily  60 capsule  6  . NON FORMULARY CPAP - at bedtime 10cmh20       . spironolactone (ALDACTONE) 25 MG tablet Take 1 tablet (25 mg total) by mouth daily.  90 tablet  3  . TIKOSYN 500 MCG capsule TAKE ONE CAPSULE BY MOUTH TWICE A DAY  180 capsule  1  . warfarin (COUMADIN) 5 MG tablet Take 5 mg by mouth as directed.        Allergies  Allergen Reactions  . Dobutamine   . Penicillins     REACTION: swelling    Review of Systems negative except from HPI and PMH  Physical Exam BP 106/69  Pulse  75  Wt 205 lb (92.987 kg) Well developed and well nourished in no acute distress HENT normal E scleral and icterus clear Neck Supple JVP flat; carotids brisk and full Clear to ausculation  Regular rate and rhythm, no murmurs gallops or rub Soft with active bowel sounds No clubbing cyanosis none Edema Alert and oriented, grossly normal motor and sensory function Skin Warm and Dry    Assessment and  Plan

## 2012-11-28 NOTE — Assessment & Plan Note (Signed)
As above.

## 2012-11-28 NOTE — Assessment & Plan Note (Signed)
Continue current medications. He is on guideline directed therapy

## 2012-11-28 NOTE — Assessment & Plan Note (Signed)
Recurrent ventricular tachycardia. It has been much quieter over the last 3-4 weeks. We will continue to watch for right now.

## 2012-11-28 NOTE — Patient Instructions (Signed)
Your physician recommends that you schedule a follow-up appointment in: 3 months with Dr Klein  

## 2012-12-13 ENCOUNTER — Other Ambulatory Visit: Payer: Self-pay | Admitting: Internal Medicine

## 2012-12-20 DIAGNOSIS — Z7901 Long term (current) use of anticoagulants: Secondary | ICD-10-CM | POA: Diagnosis not present

## 2013-01-01 ENCOUNTER — Encounter: Payer: Self-pay | Admitting: Internal Medicine

## 2013-01-01 ENCOUNTER — Other Ambulatory Visit: Payer: Self-pay | Admitting: Internal Medicine

## 2013-01-01 ENCOUNTER — Ambulatory Visit (INDEPENDENT_AMBULATORY_CARE_PROVIDER_SITE_OTHER): Payer: Medicare Other | Admitting: *Deleted

## 2013-01-01 DIAGNOSIS — I509 Heart failure, unspecified: Secondary | ICD-10-CM | POA: Diagnosis not present

## 2013-01-01 DIAGNOSIS — I428 Other cardiomyopathies: Secondary | ICD-10-CM

## 2013-01-01 DIAGNOSIS — Z95810 Presence of automatic (implantable) cardiac defibrillator: Secondary | ICD-10-CM | POA: Diagnosis not present

## 2013-01-04 LAB — REMOTE ICD DEVICE
ATRIAL PACING ICD: 98.75 pct
CHARGE TIME: 11.491 s
DEV-0020ICD: NEGATIVE
PACEART VT: 0
RV LEAD AMPLITUDE: 7.9 mv
RV LEAD IMPEDENCE ICD: 736 Ohm
TOT-0001: 10
TOT-0002: 3
TZAT-0001ATACH: 2
TZAT-0001ATACH: 3
TZAT-0001FASTVT: 1
TZAT-0001SLOWVT: 1
TZAT-0001SLOWVT: 2
TZAT-0002ATACH: NEGATIVE
TZAT-0004SLOWVT: 9
TZAT-0004SLOWVT: 9
TZAT-0005SLOWVT: 84 pct
TZAT-0005SLOWVT: 88 pct
TZAT-0011FASTVT: 10 ms
TZAT-0012ATACH: 150 ms
TZAT-0012ATACH: 150 ms
TZAT-0012ATACH: 150 ms
TZAT-0013SLOWVT: 2
TZAT-0018ATACH: NEGATIVE
TZAT-0018ATACH: NEGATIVE
TZAT-0018SLOWVT: NEGATIVE
TZAT-0018SLOWVT: NEGATIVE
TZAT-0019ATACH: 6 V
TZAT-0019FASTVT: 8 V
TZAT-0020ATACH: 1.5 ms
TZAT-0020ATACH: 1.5 ms
TZAT-0020FASTVT: 1.5 ms
TZAT-0020SLOWVT: 1.5 ms
TZAT-0020SLOWVT: 1.5 ms
TZON-0003VSLOWVT: 450 ms
TZON-0004VSLOWVT: 44
TZON-0010FASTVT: 60 ms
TZON-0010SLOWVT: 60 ms
TZST-0001ATACH: 4
TZST-0001ATACH: 6
TZST-0001FASTVT: 2
TZST-0001FASTVT: 4
TZST-0001FASTVT: 6
TZST-0001SLOWVT: 3
TZST-0001SLOWVT: 5
TZST-0002ATACH: NEGATIVE
TZST-0002ATACH: NEGATIVE
TZST-0002SLOWVT: NEGATIVE
TZST-0002SLOWVT: NEGATIVE
TZST-0003FASTVT: 35 J
TZST-0003FASTVT: 35 J

## 2013-01-06 DIAGNOSIS — S41109A Unspecified open wound of unspecified upper arm, initial encounter: Secondary | ICD-10-CM | POA: Diagnosis not present

## 2013-01-09 ENCOUNTER — Encounter: Payer: Self-pay | Admitting: *Deleted

## 2013-01-09 DIAGNOSIS — Z7901 Long term (current) use of anticoagulants: Secondary | ICD-10-CM | POA: Diagnosis not present

## 2013-01-16 DIAGNOSIS — Z7901 Long term (current) use of anticoagulants: Secondary | ICD-10-CM | POA: Diagnosis not present

## 2013-01-18 DIAGNOSIS — R05 Cough: Secondary | ICD-10-CM | POA: Diagnosis not present

## 2013-01-18 DIAGNOSIS — R0609 Other forms of dyspnea: Secondary | ICD-10-CM | POA: Diagnosis not present

## 2013-01-18 DIAGNOSIS — R0989 Other specified symptoms and signs involving the circulatory and respiratory systems: Secondary | ICD-10-CM | POA: Diagnosis not present

## 2013-01-18 DIAGNOSIS — J111 Influenza due to unidentified influenza virus with other respiratory manifestations: Secondary | ICD-10-CM | POA: Diagnosis not present

## 2013-01-23 DIAGNOSIS — J111 Influenza due to unidentified influenza virus with other respiratory manifestations: Secondary | ICD-10-CM | POA: Diagnosis not present

## 2013-01-23 DIAGNOSIS — J209 Acute bronchitis, unspecified: Secondary | ICD-10-CM | POA: Diagnosis not present

## 2013-01-29 ENCOUNTER — Other Ambulatory Visit: Payer: Self-pay | Admitting: Internal Medicine

## 2013-01-29 DIAGNOSIS — J209 Acute bronchitis, unspecified: Secondary | ICD-10-CM | POA: Diagnosis not present

## 2013-02-01 ENCOUNTER — Other Ambulatory Visit: Payer: Self-pay | Admitting: Internal Medicine

## 2013-02-26 DIAGNOSIS — E559 Vitamin D deficiency, unspecified: Secondary | ICD-10-CM | POA: Diagnosis not present

## 2013-02-26 DIAGNOSIS — C61 Malignant neoplasm of prostate: Secondary | ICD-10-CM | POA: Diagnosis not present

## 2013-02-26 DIAGNOSIS — N390 Urinary tract infection, site not specified: Secondary | ICD-10-CM | POA: Diagnosis not present

## 2013-02-27 ENCOUNTER — Encounter: Payer: Self-pay | Admitting: Internal Medicine

## 2013-02-27 ENCOUNTER — Ambulatory Visit (INDEPENDENT_AMBULATORY_CARE_PROVIDER_SITE_OTHER): Payer: Medicare Other | Admitting: Internal Medicine

## 2013-02-27 VITALS — BP 122/76 | HR 72 | Ht 67.0 in | Wt 205.0 lb

## 2013-02-27 DIAGNOSIS — I428 Other cardiomyopathies: Secondary | ICD-10-CM | POA: Diagnosis not present

## 2013-02-27 DIAGNOSIS — I4891 Unspecified atrial fibrillation: Secondary | ICD-10-CM | POA: Diagnosis not present

## 2013-02-27 DIAGNOSIS — I10 Essential (primary) hypertension: Secondary | ICD-10-CM

## 2013-02-27 DIAGNOSIS — I472 Ventricular tachycardia: Secondary | ICD-10-CM

## 2013-02-27 LAB — ICD DEVICE OBSERVATION
AL AMPLITUDE: 3.3 mv
AL THRESHOLD: 0.5 V
BAMS-0001: 170 {beats}/min
BATTERY VOLTAGE: 2.65 V
FVT: 0
RV LEAD AMPLITUDE: 7.9 mv
RV LEAD IMPEDENCE ICD: 800 Ohm
TOT-0001: 10
TOT-0002: 3
TZAT-0001ATACH: 2
TZAT-0001FASTVT: 1
TZAT-0002ATACH: NEGATIVE
TZAT-0004ATACH: 15
TZAT-0004FASTVT: 8
TZAT-0005FASTVT: 88 pct
TZAT-0011SLOWVT: 10 ms
TZAT-0011SLOWVT: 10 ms
TZAT-0018ATACH: NEGATIVE
TZAT-0019ATACH: 6 V
TZAT-0019ATACH: 6 V
TZAT-0019SLOWVT: 8 V
TZAT-0019SLOWVT: 8 V
TZAT-0020ATACH: 1.5 ms
TZAT-0020FASTVT: 1.5 ms
TZAT-0020SLOWVT: 1.5 ms
TZON-0003ATACH: 350 ms
TZON-0003VSLOWVT: 450 ms
TZON-0004VSLOWVT: 44
TZON-0010FASTVT: 60 ms
TZST-0001FASTVT: 2
TZST-0001FASTVT: 4
TZST-0001SLOWVT: 4
TZST-0002ATACH: NEGATIVE
TZST-0002SLOWVT: NEGATIVE
TZST-0002SLOWVT: NEGATIVE
TZST-0003FASTVT: 35 J
TZST-0003FASTVT: 35 J
TZST-0003FASTVT: 35 J

## 2013-02-27 LAB — BASIC METABOLIC PANEL
CO2: 26 mEq/L (ref 19–32)
Calcium: 9.2 mg/dL (ref 8.4–10.5)
Chloride: 100 mEq/L (ref 96–112)
Sodium: 134 mEq/L — ABNORMAL LOW (ref 135–145)

## 2013-02-27 LAB — CBC WITH DIFFERENTIAL/PLATELET
Basophils Relative: 0.6 % (ref 0.0–3.0)
Eosinophils Relative: 2.6 % (ref 0.0–5.0)
Lymphocytes Relative: 30.3 % (ref 12.0–46.0)
MCV: 92.3 fl (ref 78.0–100.0)
Monocytes Absolute: 0.7 10*3/uL (ref 0.1–1.0)
Neutrophils Relative %: 57.2 % (ref 43.0–77.0)
RBC: 4.7 Mil/uL (ref 4.22–5.81)
WBC: 7.2 10*3/uL (ref 4.5–10.5)

## 2013-02-27 LAB — PROTIME-INR
INR: 2.7 ratio — ABNORMAL HIGH (ref 0.8–1.0)
Prothrombin Time: 27.7 s — ABNORMAL HIGH (ref 10.2–12.4)

## 2013-02-27 LAB — HEPATIC FUNCTION PANEL
Bilirubin, Direct: 0 mg/dL (ref 0.0–0.3)
Total Protein: 7.8 g/dL (ref 6.0–8.3)

## 2013-02-27 NOTE — Patient Instructions (Addendum)
Your physician recommends that you have  lab work today: inr/tsh/bmp/liver/magnesium/cbc  Remote monitoring is used to monitor your Pacemaker of ICD from home. This monitoring reduces the number of office visits required to check your device to one time per year. It allows [place] to keep an eye on the functioning of your device to ensure it is working properly. You are scheduled for a device check from home on 06/04/13. You may send your transmission at any time that day. If you have a wireless device, the transmission will be sent automatically. After your physician reviews your transmission, you will receive a postcard with your next transmission date.   Your physician wants you to follow-up in: 6 months with Dr. Graciela Husbands. You will receive a reminder letter in the mail two months in advance. If you don't receive a letter, please call our office to schedule the follow-up appointment.

## 2013-02-27 NOTE — Assessment & Plan Note (Signed)
aa As above

## 2013-02-27 NOTE — Assessment & Plan Note (Signed)
Well-controlled on current meds 

## 2013-02-27 NOTE — Assessment & Plan Note (Signed)
Recurrent ventricular tachycardia with a couple of episodes in February. These were terminated with antitachycardia pacing. We'll continue dofetilide and mexiletine

## 2013-02-27 NOTE — Assessment & Plan Note (Signed)
Intercurrent atrial flutter. On anticoagulation

## 2013-02-27 NOTE — Progress Notes (Signed)
Patient Care Team: Desmond Dike as PCP - General (Unknown Physician Specialty) Storm Frisk, MD (Pulmonary Disease)   HPI  Cody Gomez is a 79 y.o. male  seen in followup for nonischemic myopathy and ventricular tachycardia as well as atrial fibrillation. He was started on Tikosyn    He's been having ventricular tachycardia above and below his detection rate so was decreased to 420 When he was last seen in December, his defibrillator and finally quieted down in the preceding 3-4 weeks He's been doing relatively well without significant changes in her functional status. He's had no known tachycardia. He does have a bruise on his leg. He has not checked his INR more than a month.      Past Medical History  Diagnosis Date  . Nonischemic dilated cardiomyopathy     Catheterization 2008 demonstrated 40% LAD  . Ventricular tachycardia     Shock therapy delivered to the his ICD; previous failure to tolerate amiodarone and dronaderone  . ICD (implantable cardiac defibrillator) in place 10/29/10    Medtronic-dual-chamber  . GERD (gastroesophageal reflux disease)   . HLD (hyperlipidemia)   . HTN (hypertension)   . Atrial fibrillation     chronic coumadin   . History of prostate cancer   . Hyperthyroidism     Past Surgical History  Procedure Laterality Date  . Prostatectomy    . Pacemaker insertion      medtronic  . Appendectomy    . Hernia repair      Current Outpatient Prescriptions  Medication Sig Dispense Refill  . albuterol (PROVENTIL) (2.5 MG/3ML) 0.083% nebulizer solution Take 2.5 mg by nebulization every 6 (six) hours as needed.        . carvedilol (COREG) 25 MG tablet Take 1 tablet (25 mg total) by mouth 2 (two) times daily.  180 tablet  3  . CHERATUSSIN AC 100-10 MG/5ML syrup As needed      . Fluticasone-Salmeterol (ADVAIR DISKUS) 250-50 MCG/DOSE AEPB Inhale 1 puff into the lungs 2 (two) times daily.  14 each  6  . furosemide (LASIX) 40 MG tablet TAKE 1 TABLET BY  MOUTH DAILY.  30 tablet  3  . guaiFENesin (MUCINEX) 600 MG 12 hr tablet Take 600 mg by mouth at bedtime.      Marland Kitchen KLOR-CON M20 20 MEQ tablet TAKE 1 TABLET TWICE DAILY  60 tablet  3  . lisinopril (PRINIVIL,ZESTRIL) 2.5 MG tablet Take 1 tablet (2.5 mg total) by mouth daily.  30 tablet  6  . Magnesium Chloride (SLOW-MAG PO) Take 71.5 mg by mouth 2 (two) times daily.        . methimazole (TAPAZOLE) 10 MG tablet Take 10 mg by mouth daily.        Marland Kitchen mexiletine (MEXITIL) 150 MG capsule Take one capsule by mouth twice daily  60 capsule  6  . NON FORMULARY CPAP - at bedtime 10cmh20       . spironolactone (ALDACTONE) 25 MG tablet Take 1 tablet (25 mg total) by mouth daily.  90 tablet  3  . TIKOSYN 500 MCG capsule TAKE ONE CAPSULE BY MOUTH TWICE A DAY  180 capsule  1  . warfarin (COUMADIN) 5 MG tablet Take 5 mg by mouth as directed.       No current facility-administered medications for this visit.    Allergies  Allergen Reactions  . Dobutamine   . Penicillins     REACTION: swelling    Review of Systems negative except from  HPI and PMH  Physical Exam BP 122/76  Pulse 72  Ht 5\' 7"  (1.702 m)  Wt 205 lb (92.987 kg)  BMI 32.1 kg/m2 Well developed and well nourished in no acute distress HENT normal E scleral and icterus clear Neck Supple JVP flat; carotids brisk and full Clear to ausculation  Regular rate and rhythm, no murmurs gallops or rub Soft with active bowel sounds No clubbing cyanosis none Edema Alert and oriented, grossly normal motor and sensory function Skin Warm and Dry  Electrocardiogram demonstrates atrial pacing with intrinsic conduction Intervals 24/16/47  Assessment and  Plan

## 2013-02-27 NOTE — Assessment & Plan Note (Signed)
Stable on guideline directed medical therapy

## 2013-03-07 ENCOUNTER — Other Ambulatory Visit: Payer: Self-pay | Admitting: Cardiology

## 2013-03-07 DIAGNOSIS — I472 Ventricular tachycardia: Secondary | ICD-10-CM

## 2013-03-07 DIAGNOSIS — I4891 Unspecified atrial fibrillation: Secondary | ICD-10-CM

## 2013-03-07 DIAGNOSIS — I509 Heart failure, unspecified: Secondary | ICD-10-CM

## 2013-03-07 MED ORDER — MEXILETINE HCL 150 MG PO CAPS: ORAL_CAPSULE | ORAL | Status: DC

## 2013-03-12 ENCOUNTER — Telehealth: Payer: Self-pay | Admitting: Internal Medicine

## 2013-03-12 NOTE — Telephone Encounter (Signed)
New problem   Pt want to know the results of his Labs he had done 2-3wk ago.

## 2013-03-12 NOTE — Telephone Encounter (Signed)
Pt aware of lab results ./cy 

## 2013-03-23 DIAGNOSIS — R040 Epistaxis: Secondary | ICD-10-CM | POA: Diagnosis not present

## 2013-03-23 DIAGNOSIS — Z7901 Long term (current) use of anticoagulants: Secondary | ICD-10-CM | POA: Diagnosis not present

## 2013-04-06 DIAGNOSIS — I4891 Unspecified atrial fibrillation: Secondary | ICD-10-CM | POA: Diagnosis not present

## 2013-04-06 DIAGNOSIS — Z7901 Long term (current) use of anticoagulants: Secondary | ICD-10-CM | POA: Diagnosis not present

## 2013-04-13 DIAGNOSIS — R0789 Other chest pain: Secondary | ICD-10-CM | POA: Diagnosis not present

## 2013-04-17 ENCOUNTER — Ambulatory Visit (INDEPENDENT_AMBULATORY_CARE_PROVIDER_SITE_OTHER): Payer: Medicare Other | Admitting: Critical Care Medicine

## 2013-04-17 ENCOUNTER — Encounter: Payer: Self-pay | Admitting: Critical Care Medicine

## 2013-04-17 VITALS — BP 110/70 | HR 70 | Temp 97.6°F | Ht 67.0 in | Wt 206.0 lb

## 2013-04-17 DIAGNOSIS — IMO0001 Reserved for inherently not codable concepts without codable children: Secondary | ICD-10-CM

## 2013-04-17 DIAGNOSIS — G4733 Obstructive sleep apnea (adult) (pediatric): Secondary | ICD-10-CM | POA: Diagnosis not present

## 2013-04-17 DIAGNOSIS — J449 Chronic obstructive pulmonary disease, unspecified: Secondary | ICD-10-CM | POA: Diagnosis not present

## 2013-04-17 NOTE — Assessment & Plan Note (Signed)
Obstructive sleep apnea stable at this time Maintain CPAP therapy

## 2013-04-17 NOTE — Assessment & Plan Note (Signed)
Gold stage B. COPD stable on Advair at this time Plan Maintain Advair 250 one puff twice daily Note no need for oxygen therapy

## 2013-04-17 NOTE — Patient Instructions (Addendum)
No change in medications. Return in        6 months        

## 2013-04-17 NOTE — Progress Notes (Signed)
Subjective:    Patient ID: Cody Gomez, male    DOB: October 18, 1931, 79 y.o.   MRN: [id number]  HPI 79 y.o. WM with Copd and reversable components  04/17/2013 At last ov we rec advair/pred pulse.  No recent flare ups.   Notes some mucus, is white.  No hemoptysis.  No real edema in the feet.  No real heartburn.  Notes some chest tightness, saw PCP>>>no active issues. Chest tight with exertion. Noted sl burning in chest ?ICD issue.    Past Medical History  Diagnosis Date  . Nonischemic dilated cardiomyopathy     Catheterization 2008 demonstrated 40% LAD  . Ventricular tachycardia     Shock therapy delivered to the his ICD; previous failure to tolerate amiodarone and dronaderone  . ICD (implantable cardiac defibrillator) in place 10/29/10    Medtronic-dual-chamber  . GERD (gastroesophageal reflux disease)   . HLD (hyperlipidemia)   . HTN (hypertension)   . Atrial fibrillation     chronic coumadin   . History of prostate cancer   . Hyperthyroidism      Family History  Problem Relation Age of Onset  . Heart disease Mother   . Cancer Father     bladder      History   Social History  . Marital Status: Single    Spouse Name: N/A    Number of Children: N/A  . Years of Education: N/A   Occupational History  . Not on file.   Social History Main Topics  . Smoking status: Former Smoker -- 2.00 packs/day for 38 years    Types: Cigarettes, Pipe, Cigars    Quit date: 12/13/1982  . Smokeless tobacco: Former Neurosurgeon    Types: Chew    Quit date: 12/14/1983     Comment: started when 14; quit in 1984, up to 2 ppd.   . Alcohol Use: Not on file  . Drug Use: Not on file  . Sexually Active: Not on file   Other Topics Concern  . Not on file   Social History Narrative   Married, 1 son; retired Naval architect.      Allergies  Allergen Reactions  . Dobutamine   . Penicillins     REACTION: swelling     Outpatient Prescriptions Prior to Visit  Medication Sig Dispense Refill  .  albuterol (PROVENTIL) (2.5 MG/3ML) 0.083% nebulizer solution Take 2.5 mg by nebulization every 6 (six) hours as needed.        . carvedilol (COREG) 25 MG tablet Take 1 tablet (25 mg total) by mouth 2 (two) times daily.  180 tablet  3  . CHERATUSSIN AC 100-10 MG/5ML syrup As needed      . Fluticasone-Salmeterol (ADVAIR DISKUS) 250-50 MCG/DOSE AEPB Inhale 1 puff into the lungs 2 (two) times daily.  14 each  6  . furosemide (LASIX) 40 MG tablet TAKE 1 TABLET BY MOUTH DAILY.  30 tablet  3  . guaiFENesin (MUCINEX) 600 MG 12 hr tablet Take 600 mg by mouth at bedtime.      Marland Kitchen KLOR-CON M20 20 MEQ tablet TAKE 1 TABLET TWICE DAILY  60 tablet  3  . Magnesium Chloride (SLOW-MAG PO) Take 71.5 mg by mouth 2 (two) times daily.        . methimazole (TAPAZOLE) 10 MG tablet Take 10 mg by mouth daily.        Marland Kitchen mexiletine (MEXITIL) 150 MG capsule Take one capsule by mouth twice daily  60 capsule  2  .  NON FORMULARY CPAP - at bedtime 10cmh20       . spironolactone (ALDACTONE) 25 MG tablet Take 1 tablet (25 mg total) by mouth daily.  90 tablet  3  . TIKOSYN 500 MCG capsule TAKE ONE CAPSULE BY MOUTH TWICE A DAY  180 capsule  1  . warfarin (COUMADIN) 5 MG tablet Take 5 mg by mouth as directed.       No facility-administered medications prior to visit.      Review of Systems Constitutional:   No  weight loss, night sweats,  Fevers, chills, fatigue, lassitude. HEENT:   No headaches,  Difficulty swallowing,  Tooth/dental problems,  Sore throat,                No sneezing, itching, ear ache, nasal congestion, post nasal drip,   CV:  No chest pain,  Orthopnea, PND, swelling in lower extremities, anasarca, dizziness, palpitations  GI  No heartburn, indigestion, abdominal pain, nausea, vomiting, diarrhea, change in bowel habits, loss of appetite  Resp: Notes  shortness of breath with exertion not at rest.  Notes excess mucus, notes productive cough,  No non-productive cough,  No coughing up of blood.  No change in  color of mucus.  No wheezing.  No chest wall deformity  Skin: no rash or lesions.  GU: no dysuria, change in color of urine, no urgency or frequency.  No flank pain.  MS:  No joint pain or swelling.  No decreased range of motion.  No back pain.  Psych:  No change in mood or affect. No depression or anxiety.  No memory loss.     Objective:   Physical Exam  Filed Vitals:   04/17/13 1102  BP: 110/70  Pulse: 70  Temp: 97.6 F (36.4 C)  TempSrc: Oral  Height: 5\' 7"  (1.702 m)  Weight: 206 lb (93.441 kg)  SpO2: 97%    Gen: Pleasant, well-nourished, in no distress,  normal affect  ENT: No lesions,  mouth clear,  oropharynx clear, no postnasal drip  Neck: No JVD, no TMG, no carotid bruits  Lungs: No use of accessory muscles, no dullness to percussion, distant BS, expired wheezes poor airflow  Cardiovascular: RRR, heart sounds normal, no murmur or gallops, no peripheral edema  Abdomen: soft and NT, no HSM,  BS normal  Musculoskeletal: No deformities, no cyanosis or clubbing  Neuro: alert, non focal  Skin: Warm, no lesions or rashes       Assessment & Plan:   SLEEP APNEA, OBSTRUCTIVE Obstructive sleep apnea stable at this time Maintain CPAP therapy  COPD with bronchitis Gold stage B Gold stage B. COPD stable on Advair at this time Plan Maintain Advair 250 one puff twice daily Note no need for oxygen therapy    Updated Medication List Outpatient Encounter Prescriptions as of 04/17/2013  Medication Sig Dispense Refill  . albuterol (PROVENTIL) (2.5 MG/3ML) 0.083% nebulizer solution Take 2.5 mg by nebulization every 6 (six) hours as needed.        Marland Kitchen atorvastatin (LIPITOR) 10 MG tablet Take 1 tablet by mouth daily.      . carvedilol (COREG) 25 MG tablet Take 1 tablet (25 mg total) by mouth 2 (two) times daily.  180 tablet  3  . CHERATUSSIN AC 100-10 MG/5ML syrup As needed      . Fluticasone-Salmeterol (ADVAIR DISKUS) 250-50 MCG/DOSE AEPB Inhale 1 puff into the lungs  2 (two) times daily.  14 each  6  . furosemide (LASIX) 40 MG tablet  TAKE 1 TABLET BY MOUTH DAILY.  30 tablet  3  . guaiFENesin (MUCINEX) 600 MG 12 hr tablet Take 600 mg by mouth at bedtime.      Marland Kitchen KLOR-CON M20 20 MEQ tablet TAKE 1 TABLET TWICE DAILY  60 tablet  3  . Magnesium Chloride (SLOW-MAG PO) Take 71.5 mg by mouth 2 (two) times daily.        . methimazole (TAPAZOLE) 10 MG tablet Take 10 mg by mouth daily.        Marland Kitchen mexiletine (MEXITIL) 150 MG capsule Take one capsule by mouth twice daily  60 capsule  2  . NITROSTAT 0.4 MG SL tablet Place 1 tablet under the tongue as needed.      . NON FORMULARY CPAP - at bedtime 10cmh20       . spironolactone (ALDACTONE) 25 MG tablet Take 1 tablet (25 mg total) by mouth daily.  90 tablet  3  . TIKOSYN 500 MCG capsule TAKE ONE CAPSULE BY MOUTH TWICE A DAY  180 capsule  1  . warfarin (COUMADIN) 5 MG tablet Take 5 mg by mouth as directed.       No facility-administered encounter medications on file as of 04/17/2013.

## 2013-04-18 DIAGNOSIS — C4441 Basal cell carcinoma of skin of scalp and neck: Secondary | ICD-10-CM | POA: Diagnosis not present

## 2013-04-18 DIAGNOSIS — D485 Neoplasm of uncertain behavior of skin: Secondary | ICD-10-CM | POA: Diagnosis not present

## 2013-04-23 DIAGNOSIS — D219 Benign neoplasm of connective and other soft tissue, unspecified: Secondary | ICD-10-CM | POA: Diagnosis not present

## 2013-05-09 ENCOUNTER — Other Ambulatory Visit: Payer: Self-pay | Admitting: Internal Medicine

## 2013-05-09 ENCOUNTER — Other Ambulatory Visit: Payer: Self-pay | Admitting: Emergency Medicine

## 2013-05-09 DIAGNOSIS — I472 Ventricular tachycardia: Secondary | ICD-10-CM

## 2013-05-09 MED ORDER — CARVEDILOL 25 MG PO TABS: 25.0000 mg | ORAL_TABLET | Freq: Two times a day (BID) | ORAL | Status: DC

## 2013-05-11 DIAGNOSIS — I4891 Unspecified atrial fibrillation: Secondary | ICD-10-CM | POA: Diagnosis not present

## 2013-05-11 DIAGNOSIS — Z7901 Long term (current) use of anticoagulants: Secondary | ICD-10-CM | POA: Diagnosis not present

## 2013-05-28 ENCOUNTER — Encounter: Payer: Medicare Other | Admitting: *Deleted

## 2013-06-04 ENCOUNTER — Ambulatory Visit (INDEPENDENT_AMBULATORY_CARE_PROVIDER_SITE_OTHER): Payer: Medicare Other | Admitting: *Deleted

## 2013-06-04 DIAGNOSIS — I428 Other cardiomyopathies: Secondary | ICD-10-CM

## 2013-06-04 DIAGNOSIS — Z95810 Presence of automatic (implantable) cardiac defibrillator: Secondary | ICD-10-CM

## 2013-06-04 DIAGNOSIS — I509 Heart failure, unspecified: Secondary | ICD-10-CM | POA: Diagnosis not present

## 2013-06-05 ENCOUNTER — Other Ambulatory Visit: Payer: Self-pay | Admitting: Internal Medicine

## 2013-06-07 ENCOUNTER — Other Ambulatory Visit: Payer: Self-pay | Admitting: Internal Medicine

## 2013-06-12 LAB — REMOTE ICD DEVICE
AL AMPLITUDE: 2.1 mv
AL IMPEDENCE ICD: 400 Ohm
ATRIAL PACING ICD: 99.82 pct
BAMS-0001: 170 {beats}/min
BATTERY VOLTAGE: 2.62 v
BRDY-0002RA: 70 {beats}/min
BRDY-0003RA: 130 {beats}/min
BRDY-0004RA: 130 {beats}/min
CHARGE TIME: 12.542 s
DEV-0020ICD: NEGATIVE
FVT: 0
PACEART VT: 0
RV LEAD IMPEDENCE ICD: 744 Ohm
TOT-0001: 10
TOT-0002: 3
TOT-0006: 20080805000000
TZAT-0001ATACH: 1
TZAT-0001ATACH: 2
TZAT-0001ATACH: 3
TZAT-0001FASTVT: 1
TZAT-0001SLOWVT: 1
TZAT-0001SLOWVT: 2
TZAT-0002ATACH: NEGATIVE
TZAT-0004ATACH: 15
TZAT-0004ATACH: 7
TZAT-0004FASTVT: 8
TZAT-0004SLOWVT: 9
TZAT-0004SLOWVT: 9
TZAT-0005FASTVT: 88 pct
TZAT-0005SLOWVT: 84 pct
TZAT-0005SLOWVT: 88 pct
TZAT-0011ATACH: 10 ms
TZAT-0011ATACH: 10 ms
TZAT-0011FASTVT: 10 ms
TZAT-0011SLOWVT: 10 ms
TZAT-0011SLOWVT: 10 ms
TZAT-0012ATACH: 150 ms
TZAT-0012ATACH: 150 ms
TZAT-0012ATACH: 150 ms
TZAT-0012FASTVT: 200 ms
TZAT-0012SLOWVT: 200 ms
TZAT-0012SLOWVT: 200 ms
TZAT-0013FASTVT: 3
TZAT-0013SLOWVT: 2
TZAT-0013SLOWVT: 2
TZAT-0018ATACH: NEGATIVE
TZAT-0018ATACH: NEGATIVE
TZAT-0018ATACH: NEGATIVE
TZAT-0018FASTVT: NEGATIVE
TZAT-0018SLOWVT: NEGATIVE
TZAT-0018SLOWVT: NEGATIVE
TZAT-0019ATACH: 6 v
TZAT-0019ATACH: 6 v
TZAT-0019ATACH: 6 v
TZAT-0019FASTVT: 8 v
TZAT-0019SLOWVT: 8 v
TZAT-0019SLOWVT: 8 v
TZAT-0020ATACH: 1.5 ms
TZAT-0020ATACH: 1.5 ms
TZAT-0020ATACH: 1.5 ms
TZAT-0020FASTVT: 1.5 ms
TZAT-0020SLOWVT: 1.5 ms
TZAT-0020SLOWVT: 1.5 ms
TZON-0003ATACH: 350 ms
TZON-0003FASTVT: 250 ms
TZON-0003SLOWVT: 410 ms
TZON-0003VSLOWVT: 450 ms
TZON-0004SLOWVT: 40
TZON-0004VSLOWVT: 44
TZON-0005SLOWVT: 16
TZON-0010FASTVT: 60 ms
TZON-0010SLOWVT: 60 ms
TZST-0001ATACH: 4
TZST-0001ATACH: 5
TZST-0001ATACH: 6
TZST-0001FASTVT: 2
TZST-0001FASTVT: 3
TZST-0001FASTVT: 4
TZST-0001FASTVT: 5
TZST-0001FASTVT: 6
TZST-0001SLOWVT: 3
TZST-0001SLOWVT: 4
TZST-0001SLOWVT: 5
TZST-0001SLOWVT: 6
TZST-0002ATACH: NEGATIVE
TZST-0002ATACH: NEGATIVE
TZST-0002ATACH: NEGATIVE
TZST-0002SLOWVT: NEGATIVE
TZST-0002SLOWVT: NEGATIVE
TZST-0002SLOWVT: NEGATIVE
TZST-0002SLOWVT: NEGATIVE
TZST-0003FASTVT: 35 J
TZST-0003FASTVT: 35 J
TZST-0003FASTVT: 35 J
TZST-0003FASTVT: 35 J
TZST-0003FASTVT: 35 J
VENTRICULAR PACING ICD: 99.64 pct
VF: 0

## 2013-06-14 ENCOUNTER — Encounter: Payer: Self-pay | Admitting: Internal Medicine

## 2013-06-14 ENCOUNTER — Encounter: Payer: Self-pay | Admitting: Cardiology

## 2013-06-14 ENCOUNTER — Encounter: Payer: Self-pay | Admitting: *Deleted

## 2013-06-14 ENCOUNTER — Ambulatory Visit (INDEPENDENT_AMBULATORY_CARE_PROVIDER_SITE_OTHER): Payer: Medicare Other | Admitting: Cardiology

## 2013-06-14 VITALS — BP 124/80 | HR 72 | Ht 67.0 in | Wt 203.4 lb

## 2013-06-14 DIAGNOSIS — Z5181 Encounter for therapeutic drug level monitoring: Secondary | ICD-10-CM

## 2013-06-14 DIAGNOSIS — Z7901 Long term (current) use of anticoagulants: Secondary | ICD-10-CM

## 2013-06-14 DIAGNOSIS — Z4502 Encounter for adjustment and management of automatic implantable cardiac defibrillator: Secondary | ICD-10-CM | POA: Diagnosis not present

## 2013-06-14 DIAGNOSIS — I4891 Unspecified atrial fibrillation: Secondary | ICD-10-CM

## 2013-06-14 DIAGNOSIS — I5022 Chronic systolic (congestive) heart failure: Secondary | ICD-10-CM | POA: Diagnosis not present

## 2013-06-14 DIAGNOSIS — Z79899 Other long term (current) drug therapy: Secondary | ICD-10-CM

## 2013-06-14 DIAGNOSIS — I428 Other cardiomyopathies: Secondary | ICD-10-CM

## 2013-06-14 DIAGNOSIS — T82198D Other mechanical complication of other cardiac electronic device, subsequent encounter: Secondary | ICD-10-CM

## 2013-06-14 DIAGNOSIS — Z95810 Presence of automatic (implantable) cardiac defibrillator: Secondary | ICD-10-CM | POA: Diagnosis not present

## 2013-06-14 DIAGNOSIS — Z01818 Encounter for other preprocedural examination: Secondary | ICD-10-CM | POA: Diagnosis not present

## 2013-06-14 DIAGNOSIS — I472 Ventricular tachycardia: Secondary | ICD-10-CM

## 2013-06-14 DIAGNOSIS — Z5189 Encounter for other specified aftercare: Secondary | ICD-10-CM

## 2013-06-14 LAB — ICD DEVICE OBSERVATION
AL AMPLITUDE: 3 mv
AL THRESHOLD: 0.5 V
ATRIAL PACING ICD: 99.7 pct
DEV-0020ICD: NEGATIVE
RV LEAD THRESHOLD: 1 V
TZAT-0001ATACH: 1
TZAT-0001ATACH: 2
TZAT-0001ATACH: 3
TZAT-0001SLOWVT: 1
TZAT-0001SLOWVT: 2
TZAT-0004SLOWVT: 9
TZAT-0004SLOWVT: 9
TZAT-0005FASTVT: 88 pct
TZAT-0005SLOWVT: 84 pct
TZAT-0005SLOWVT: 88 pct
TZAT-0011FASTVT: 10 ms
TZAT-0012ATACH: 150 ms
TZAT-0012ATACH: 150 ms
TZAT-0018ATACH: NEGATIVE
TZAT-0018ATACH: NEGATIVE
TZAT-0019FASTVT: 8 V
TZAT-0020ATACH: 1.5 ms
TZAT-0020ATACH: 1.5 ms
TZAT-0020SLOWVT: 1.5 ms
TZON-0004SLOWVT: 40
TZON-0004VSLOWVT: 44
TZST-0001ATACH: 4
TZST-0001ATACH: 6
TZST-0001FASTVT: 3
TZST-0001FASTVT: 6
TZST-0001SLOWVT: 3
TZST-0001SLOWVT: 5
TZST-0002ATACH: NEGATIVE
TZST-0002ATACH: NEGATIVE
TZST-0002SLOWVT: NEGATIVE
TZST-0002SLOWVT: NEGATIVE
TZST-0003FASTVT: 35 J
TZST-0003FASTVT: 35 J
TZST-0003FASTVT: 35 J

## 2013-06-14 NOTE — Progress Notes (Signed)
ELECTROPHYSIOLOGY OFFICE NOTE  Patient ID: Cody Gomez MRN: [id number], DOB/AGE: May 26, 1931   Date of Visit: 06/14/2013  Primary Physician: Desmond Dike, MD Primary Cardiologist: Berton Mount, MD Reason for Visit: EP/device follow-up  History of Present Illness  Cody Gomez is a 79 y.o. male with a NICM s/p dual chamber ICD implant in 2011, nonobstructive CAD s/p cath 2008, chronic systolic HF, paroxysmal VT managed with Tikosyn and mexiletine and PAF on chronic Coumadin therapy who presents today for electrophysiology followup. His ICD battery is at [facility]. Of note, he has a St. Jude Riata RV lead that is on recall.  Since last being seen by Dr. Graciela Husbands in March 2014, he reports he is doing well and has no complaints. He has chronic DOE but this has been stable as he denies worsening. He denies chest pain. He denies palpitations, dizziness, near syncope or syncope. He denies LE swelling, orthopnea, PND or recent weight gain. He is compliant and tolerating medications without difficulty.  Past Medical History Past Medical History  Diagnosis Date  . Nonischemic dilated cardiomyopathy     Catheterization 2008 demonstrated 40% LAD  . Ventricular tachycardia     Shock therapy delivered to the his ICD; previous failure to tolerate amiodarone and dronaderone  . ICD (implantable cardiac defibrillator) in place 10/29/10    Medtronic-dual-chamber  . GERD (gastroesophageal reflux disease)   . HLD (hyperlipidemia)   . HTN (hypertension)   . Atrial fibrillation     chronic coumadin   . History of prostate cancer   . Hyperthyroidism     Past Surgical History Past Surgical History  Procedure Laterality Date  . Prostatectomy    . Pacemaker insertion      medtronic  . Appendectomy    . Hernia repair      Allergies/Intolerances Allergies  Allergen Reactions  . Dobutamine   . Penicillins     REACTION: swelling   Current Home Medications Current Outpatient Prescriptions  Medication Sig  Dispense Refill  . albuterol (PROVENTIL) (2.5 MG/3ML) 0.083% nebulizer solution Take 2.5 mg by nebulization every 6 (six) hours as needed.        Marland Kitchen atorvastatin (LIPITOR) 10 MG tablet Take 1 tablet by mouth daily.      . carvedilol (COREG) 25 MG tablet Take 1 tablet (25 mg total) by mouth 2 (two) times daily.  180 tablet  3  . CHERATUSSIN AC 100-10 MG/5ML syrup As needed      . Fluticasone-Salmeterol (ADVAIR DISKUS) 250-50 MCG/DOSE AEPB Inhale 1 puff into the lungs 2 (two) times daily.  14 each  6  . furosemide (LASIX) 40 MG tablet TAKE 1 TABLET BY MOUTH DAILY.  30 tablet  3  . guaiFENesin (MUCINEX) 600 MG 12 hr tablet Take 600 mg by mouth at bedtime.      . Magnesium Chloride (SLOW-MAG PO) Take 71.5 mg by mouth 2 (two) times daily.        . methimazole (TAPAZOLE) 10 MG tablet Take 10 mg by mouth daily.        Marland Kitchen mexiletine (MEXITIL) 150 MG capsule TAKE ONE CAPSULE BY MOUTH TWICE DAILY  60 capsule  0  . NITROSTAT 0.4 MG SL tablet Place 1 tablet under the tongue as needed.      . NON FORMULARY CPAP - at bedtime 10cmh20       . potassium chloride SA (K-DUR,KLOR-CON) 20 MEQ tablet TAKE 1 TABLET BY MOUTH TWICE DAILY  60 tablet  2  . spironolactone (ALDACTONE)  25 MG tablet Take 1 tablet (25 mg total) by mouth daily.  90 tablet  3  . TIKOSYN 500 MCG capsule TAKE 1 CAPSULE BY MOUTH TWICE DAILY  180 capsule  1  . warfarin (COUMADIN) 5 MG tablet Take 5 mg by mouth as directed.       No current facility-administered medications for this visit.   Social History Social History  . Marital Status: Single   Social History Main Topics  . Smoking status: Former Smoker -- 2.00 packs/day for 38 years    Types: Cigarettes, Pipe, Cigars    Quit date: 12/13/1982  . Smokeless tobacco: Former Neurosurgeon    Types: Chew    Quit date: 12/14/1983     Comment: started when 14; quit in 1984, up to 2 ppd.   . Alcohol Use: No  . Drug Use: No   Review of Systems General: No chills, fever, night sweats or weight  changes Cardiovascular: No chest pain, dyspnea on exertion, edema, orthopnea, palpitations, paroxysmal nocturnal dyspnea Dermatological: No rash, lesions or masses Respiratory: No cough, dyspnea Urologic: No hematuria, dysuria Abdominal: No nausea, vomiting, diarrhea, bright red blood per rectum, melena, or hematemesis Neurologic: No visual changes, weakness, changes in mental status All other systems reviewed and are otherwise negative except as noted above.  Physical Exam Vitals: Blood pressure 124/80, pulse 72, height 5\' 7"  (1.702 m), weight 203 lb 6.4 oz (92.262 kg), SpO2 97.00%.  General: Well developed, well appearing 79 y.o. male in no acute distress. HEENT: Normocephalic, atraumatic. EOMs intact. Sclera nonicteric. Oropharynx clear.  Neck: Supple. No JVD. Lungs: Respirations regular and unlabored, CTA bilaterally. No wheezes, rales or rhonchi. Heart: RRR. S1, S2 present. No murmur, rub, S3 or S4. Abdomen: Soft, non-tender, non-distended. BS present x 4 quadrants. No hepatosplenomegaly.  Extremities: No clubbing, cyanosis or edema. PT/Radials 2+ and equal bilaterally. Psych: Normal affect. Neuro: Alert and oriented X 3. Moves all extremities spontaneously.   Diagnostics 12-lead ECG today (with temporary programming changes made to allow intrinsic conduction/V sensing) - A paced V sensed at 71 bpm; first degree AV block, RBBB; QRS duration 162 msec Device interrogation today - Battery at ERI otherwise normal device function. Thresholds and sensing consistent with previous device measurements. Impedance trends stable over time. 1 VT episode (cycle length 340 msec), 25 seconds in duration, EGM consistent with monomorphic VT successfully terminated with ATP x1. 4 NSVT episodes 0 AT/AF episodes. Histogram distribution appropriate for patient and level of activity. No changes made this session. Device programmed at appropriate safety margins.  Assessment and Plan 1. ICD battery at [facility] 2.  St. Jude Riata RV lead on recall 3. NICM, last LVEF assessment done by TEE July 2012 - moderately reduced 40-45% 4. Chronic systolic HF 5. Paroxysmal VT 6. PAF 7. Coagulopathy secondary to chronic Coumadin therapy  Mr. Cody Gomez ICD battery is at [facility] and his RV lead is on recall. He clearly needs ICD generator change with RV lead revision. In addition, he may need CRT-D upgrade. His HF symptoms are stable. His intrinsic QRS duration is wide with RBBB pattern and he is V pacing nearly 100% of the time due to underlying first degree AV block. Therefore, I will repeat an echo to evaluate his LVEF. If his LVEF <50%, he may benefit from CRT-D upgrade. I discussed the indication and need for ICD generator change with RV lead revision as well as the possibility of CRT-D upgrade if his LVEF remains <50% with Mr. Cody Gomez and his wife.  The procedure involved, including risks and benefits, were reviewed in detail. Risks, benefits and alternatives to ICD were discussed in detail with the patient today. These risks include, but are not limited to, bleeding, infection, pneumothorax, perforation, tamponade, vascular damage, renal failure, MI, stroke, death, and lead dislodgement. The patient expressed verbal understanding and wishes to proceed with the above plan of care.   Signed, Rick Duff, PA-C 06/14/2013, 11:14 AM

## 2013-06-14 NOTE — Patient Instructions (Addendum)
Your physician has recommended that you have a pacemaker Generator Check. A pacemaker is a small device that is placed under the skin of your chest or abdomen to help control abnormal heart rhythms. This device uses electrical pulses to prompt the heart to beat at a normal rate. Pacemakers are used to treat heart rhythms that are too slow. Wire (leads) are attached to the pacemaker that goes into the chambers of you heart. This is done in the hospital and usually requires and overnight stay.  Your physician recommends that you return for lab work in: COUMADIN CHECK, BMET, CBC ON 06-18-2013  Your physician has recommended you make the following change in your medication:   Hold lasix and spironolactone MORNING OF PROCEDURE  TAKE YOUR LAST DOSE OF COUMADIN (WE WILL NOTIFY YOU)  2 BOTTLES OF SAMPLES FOR TIKOSYN TODAY   Your physician has requested that you have an echocardiogram. Echocardiography is a painless test that uses sound waves to create images of your heart. It provides your doctor with information about the size and shape of your heart and how well your heart's chambers and valves are working. This procedure takes approximately one hour. There are no restrictions for this procedure.

## 2013-06-18 ENCOUNTER — Other Ambulatory Visit: Payer: Medicare Other

## 2013-06-18 ENCOUNTER — Encounter (HOSPITAL_COMMUNITY): Payer: Self-pay | Admitting: Respiratory Therapy

## 2013-06-19 ENCOUNTER — Ambulatory Visit (HOSPITAL_COMMUNITY): Payer: Medicare Other | Attending: Cardiology

## 2013-06-19 ENCOUNTER — Other Ambulatory Visit (INDEPENDENT_AMBULATORY_CARE_PROVIDER_SITE_OTHER): Payer: Medicare Other

## 2013-06-19 DIAGNOSIS — I472 Ventricular tachycardia, unspecified: Secondary | ICD-10-CM | POA: Diagnosis not present

## 2013-06-19 DIAGNOSIS — Z4502 Encounter for adjustment and management of automatic implantable cardiac defibrillator: Secondary | ICD-10-CM

## 2013-06-19 DIAGNOSIS — E785 Hyperlipidemia, unspecified: Secondary | ICD-10-CM | POA: Diagnosis not present

## 2013-06-19 DIAGNOSIS — I428 Other cardiomyopathies: Secondary | ICD-10-CM

## 2013-06-19 DIAGNOSIS — I4729 Other ventricular tachycardia: Secondary | ICD-10-CM | POA: Insufficient documentation

## 2013-06-19 DIAGNOSIS — Z5181 Encounter for therapeutic drug level monitoring: Secondary | ICD-10-CM

## 2013-06-19 DIAGNOSIS — I10 Essential (primary) hypertension: Secondary | ICD-10-CM | POA: Insufficient documentation

## 2013-06-19 DIAGNOSIS — I079 Rheumatic tricuspid valve disease, unspecified: Secondary | ICD-10-CM | POA: Diagnosis not present

## 2013-06-19 DIAGNOSIS — Z5189 Encounter for other specified aftercare: Secondary | ICD-10-CM

## 2013-06-19 DIAGNOSIS — I059 Rheumatic mitral valve disease, unspecified: Secondary | ICD-10-CM | POA: Diagnosis not present

## 2013-06-19 DIAGNOSIS — I509 Heart failure, unspecified: Secondary | ICD-10-CM | POA: Insufficient documentation

## 2013-06-19 DIAGNOSIS — Z01818 Encounter for other preprocedural examination: Secondary | ICD-10-CM

## 2013-06-19 DIAGNOSIS — I4891 Unspecified atrial fibrillation: Secondary | ICD-10-CM

## 2013-06-19 DIAGNOSIS — Z7901 Long term (current) use of anticoagulants: Secondary | ICD-10-CM | POA: Diagnosis not present

## 2013-06-19 DIAGNOSIS — I2510 Atherosclerotic heart disease of native coronary artery without angina pectoris: Secondary | ICD-10-CM | POA: Diagnosis not present

## 2013-06-19 DIAGNOSIS — T82198D Other mechanical complication of other cardiac electronic device, subsequent encounter: Secondary | ICD-10-CM

## 2013-06-19 DIAGNOSIS — Z79899 Other long term (current) drug therapy: Secondary | ICD-10-CM

## 2013-06-19 DIAGNOSIS — I5022 Chronic systolic (congestive) heart failure: Secondary | ICD-10-CM

## 2013-06-19 LAB — CBC WITH DIFFERENTIAL/PLATELET
Basophils Absolute: 0 10*3/uL (ref 0.0–0.1)
Basophils Relative: 0.6 % (ref 0.0–3.0)
Eosinophils Absolute: 0.4 10*3/uL (ref 0.0–0.7)
Hemoglobin: 13.6 g/dL (ref 13.0–17.0)
MCHC: 33.6 g/dL (ref 30.0–36.0)
MCV: 93.8 fl (ref 78.0–100.0)
Monocytes Absolute: 0.7 10*3/uL (ref 0.1–1.0)
Neutro Abs: 4.8 10*3/uL (ref 1.4–7.7)
Neutrophils Relative %: 61.8 % (ref 43.0–77.0)
RBC: 4.33 Mil/uL (ref 4.22–5.81)
RDW: 13.8 % (ref 11.5–14.6)

## 2013-06-19 LAB — BASIC METABOLIC PANEL
BUN: 16 mg/dL (ref 6–23)
Creatinine, Ser: 0.9 mg/dL (ref 0.4–1.5)
GFR: 92.16 mL/min (ref >60.00)
Glucose, Bld: 86 mg/dL (ref 70–99)
Potassium: 4.4 mEq/L (ref 3.5–5.1)

## 2013-06-19 NOTE — Progress Notes (Signed)
Echocardiogram performed.  

## 2013-06-22 ENCOUNTER — Ambulatory Visit (HOSPITAL_COMMUNITY)
Admission: RE | Admit: 2013-06-22 | Discharge: 2013-06-22 | Disposition: A | Discharge status: Home / Self Care | Payer: Medicare Other | Source: Ambulatory Visit | Attending: Internal Medicine | Admitting: Internal Medicine

## 2013-06-22 ENCOUNTER — Encounter (HOSPITAL_COMMUNITY)
Admission: RE | Disposition: A | Discharge status: Home / Self Care | Payer: Self-pay | Source: Ambulatory Visit | Attending: Internal Medicine

## 2013-06-22 DIAGNOSIS — I4891 Unspecified atrial fibrillation: Secondary | ICD-10-CM

## 2013-06-22 DIAGNOSIS — Z8546 Personal history of malignant neoplasm of prostate: Secondary | ICD-10-CM

## 2013-06-22 DIAGNOSIS — E785 Hyperlipidemia, unspecified: Secondary | ICD-10-CM | POA: Diagnosis not present

## 2013-06-22 DIAGNOSIS — I428 Other cardiomyopathies: Secondary | ICD-10-CM

## 2013-06-22 DIAGNOSIS — I4510 Unspecified right bundle-branch block: Secondary | ICD-10-CM | POA: Insufficient documentation

## 2013-06-22 DIAGNOSIS — Z87891 Personal history of nicotine dependence: Secondary | ICD-10-CM | POA: Insufficient documentation

## 2013-06-22 DIAGNOSIS — I472 Ventricular tachycardia, unspecified: Secondary | ICD-10-CM

## 2013-06-22 DIAGNOSIS — I2510 Atherosclerotic heart disease of native coronary artery without angina pectoris: Secondary | ICD-10-CM | POA: Insufficient documentation

## 2013-06-22 DIAGNOSIS — Z4502 Encounter for adjustment and management of automatic implantable cardiac defibrillator: Secondary | ICD-10-CM | POA: Diagnosis not present

## 2013-06-22 DIAGNOSIS — IMO0001 Reserved for inherently not codable concepts without codable children: Secondary | ICD-10-CM

## 2013-06-22 DIAGNOSIS — I509 Heart failure, unspecified: Secondary | ICD-10-CM | POA: Diagnosis not present

## 2013-06-22 DIAGNOSIS — Z9079 Acquired absence of other genital organ(s): Secondary | ICD-10-CM | POA: Diagnosis not present

## 2013-06-22 DIAGNOSIS — E0590 Thyrotoxicosis, unspecified without thyrotoxic crisis or storm: Secondary | ICD-10-CM | POA: Diagnosis not present

## 2013-06-22 DIAGNOSIS — Z7901 Long term (current) use of anticoagulants: Secondary | ICD-10-CM | POA: Diagnosis not present

## 2013-06-22 DIAGNOSIS — Z79899 Other long term (current) drug therapy: Secondary | ICD-10-CM | POA: Insufficient documentation

## 2013-06-22 DIAGNOSIS — K222 Esophageal obstruction: Secondary | ICD-10-CM

## 2013-06-22 DIAGNOSIS — I5022 Chronic systolic (congestive) heart failure: Secondary | ICD-10-CM | POA: Insufficient documentation

## 2013-06-22 DIAGNOSIS — IMO0002 Reserved for concepts with insufficient information to code with codable children: Secondary | ICD-10-CM | POA: Insufficient documentation

## 2013-06-22 DIAGNOSIS — K219 Gastro-esophageal reflux disease without esophagitis: Secondary | ICD-10-CM

## 2013-06-22 DIAGNOSIS — G4733 Obstructive sleep apnea (adult) (pediatric): Secondary | ICD-10-CM

## 2013-06-22 DIAGNOSIS — I4729 Other ventricular tachycardia: Secondary | ICD-10-CM | POA: Insufficient documentation

## 2013-06-22 DIAGNOSIS — I440 Atrioventricular block, first degree: Secondary | ICD-10-CM | POA: Insufficient documentation

## 2013-06-22 DIAGNOSIS — I10 Essential (primary) hypertension: Secondary | ICD-10-CM

## 2013-06-22 HISTORY — PX: IMPLANTABLE CARDIOVERTER DEFIBRILLATOR GENERATOR CHANGE: SHX5859

## 2013-06-22 HISTORY — PX: LEAD REVISION: SHX5945

## 2013-06-22 SURGERY — LEAD REVISION
Anesthesia: LOCAL

## 2013-06-22 MED ORDER — SODIUM CHLORIDE 0.9 % IR SOLN
80.0000 mg | Status: DC
  Filled 2013-06-22 (×2): qty 2

## 2013-06-22 MED ORDER — VANCOMYCIN HCL IN DEXTROSE 1-5 GM/200ML-% IV SOLN
INTRAVENOUS | Status: AC
  Filled 2013-06-22: qty 200

## 2013-06-22 MED ORDER — SODIUM CHLORIDE 0.9 % IV SOLN: INTRAVENOUS | Status: AC

## 2013-06-22 MED ORDER — ONDANSETRON HCL 4 MG/2ML IJ SOLN: 4.0000 mg | Freq: Four times a day (QID) | INTRAMUSCULAR | Status: DC | PRN

## 2013-06-22 MED ORDER — MUPIROCIN 2 % EX OINT
TOPICAL_OINTMENT | CUTANEOUS | Status: AC
  Filled 2013-06-22: qty 22

## 2013-06-22 MED ORDER — LIDOCAINE HCL (PF) 1 % IJ SOLN
INTRAMUSCULAR | Status: AC
  Filled 2013-06-22: qty 60

## 2013-06-22 MED ORDER — MIDAZOLAM HCL 5 MG/5ML IJ SOLN
INTRAMUSCULAR | Status: AC
  Filled 2013-06-22: qty 5

## 2013-06-22 MED ORDER — ACETAMINOPHEN 325 MG PO TABS: 325.0000 mg | ORAL_TABLET | ORAL | Status: DC | PRN

## 2013-06-22 MED ORDER — FENTANYL CITRATE 0.05 MG/ML IJ SOLN
INTRAMUSCULAR | Status: AC
  Filled 2013-06-22: qty 2

## 2013-06-22 MED ORDER — CHLORHEXIDINE GLUCONATE 4 % EX LIQD: 60.0000 mL | Freq: Once | CUTANEOUS | Status: DC

## 2013-06-22 MED ORDER — SODIUM CHLORIDE 0.9 % IV SOLN
INTRAVENOUS | Status: DC
  Administered 2013-06-22: 12:00:00 via INTRAVENOUS

## 2013-06-22 MED ORDER — VANCOMYCIN HCL IN DEXTROSE 1-5 GM/200ML-% IV SOLN: 1000.0000 mg | INTRAVENOUS | Status: DC

## 2013-06-22 MED ORDER — MUPIROCIN 2 % EX OINT: TOPICAL_OINTMENT | Freq: Two times a day (BID) | CUTANEOUS | Status: DC

## 2013-06-22 NOTE — Progress Notes (Signed)
BROOK IN TO SEE CLIENT AND PRESSURE DRESSING APPLIED AND OFFERED TO CLIENT TO STAY OVERNIGHT AND WILL SEE IN OFFICE MONDAY

## 2013-06-22 NOTE — Progress Notes (Signed)
PER BROOK PA NO EKG NEEDED AND NO SLING NEEDED

## 2013-06-22 NOTE — CV Procedure (Signed)
Preoperative diagnosis  NICM VT previous IICD now at [facility]  interrecurrnet recovery of LV function AUC 8 Postoperative diagnosis same/   Procedure: Generator replacement  Pocket revision  Following informed consent the patient was brought to the electrophysiology laboratory in place of the fluoroscopic table in the supine position after routine prep and drape lidocaine was infiltrated in the region of the previous incision and carried down to later the device pocket using sharp dissection and electrocautery. The pocket was opened the device was freed up and was explanted.  Interrogation of the previously implanted ICD ventricular lead St Jude [id number]  demonstrated an R wave of 13.1  millivolts., and impedance of 472 ohms, and a pacing threshold of 1.1 volts at 0.5 msec.    The previously implanted atrial lead Medtronic  demonstrated a P-wave amplitude of 3.5 milllivolts  and impedance of  469 ohms, and a pacing threshold of 0.4 volts at  @ 0.13milliseconds.  The leads were  Inspected; there was some heme discoloration of the RA lead. Repair was not  needed. The leads were then attached to a Medtronic  pulse generator, serial number [id number].    Through the device the P-wave amplitude  Was  2.4 milllivolts and impedance of  418 ohms, and a pacing threshold of 0.5 volts at  @ 0.47milliseconds; the ICD ventricular lead demonstrated an R wave of 8.9  millivolts., and impedance of 760 ohms, and a pacing threshold of 1.25 volts at 0.6 msec    High voltage impedances were 73/64 ohms  The bottom of the pocket was excavated and removed to free up the leads to allow standard positionintg  The pocket was irrigated with antibiotic containing saline solution hemostasis was assured and the leads and the device were placed in the pocket. The wound was then closed in 3 layers in normal fashion.  The patient tolerated the procedure without apparent complication.  DFT testing was not  performed  Sherryl Manges   \

## 2013-06-22 NOTE — Progress Notes (Signed)
Cody Gomez presents today for ICD generator change and possible RV lead revision. Please see my note from 06/14/2013. His RV lead is on recall but is stable by my interrogation last week. He V paces nearly 100% of the time. Therefore, he underwent 2D echo pre-operatively to evaluate his LV function given his h/o NICM to assess whether or not he needed upgrade to CRT-D. His echo revealed normal LV function, EF 50-55%. He does not meet criteria for CRT.   I again reviewed the indication and need for ICD generator change and possible RV lead revision with Cody Gomez and his wife. The procedure involved, including risks and benefits, were reviewed in detail. Risks, benefits and alternatives to ICD were discussed in detail with the patient today. These risks include, but are not limited to, bleeding, infection, pneumothorax, perforation, tamponade, vascular damage, renal failure, lead dislodgement, MI, stroke and death. Cody Gomez expressed verbal understanding and wishes to proceed with the above plan of care. Dr. Graciela Husbands to see.

## 2013-06-25 ENCOUNTER — Ambulatory Visit (INDEPENDENT_AMBULATORY_CARE_PROVIDER_SITE_OTHER): Payer: Medicare Other | Admitting: *Deleted

## 2013-06-25 DIAGNOSIS — Z95810 Presence of automatic (implantable) cardiac defibrillator: Secondary | ICD-10-CM

## 2013-06-25 LAB — ICD DEVICE OBSERVATION

## 2013-06-25 NOTE — Progress Notes (Signed)
Pt seen in device clinic for wound check of recent ICD changeout. Pressure dressing removed and Dr Graciela Husbands evaluated. Per SK follow up for wound check in 7-10 days.  Batya Citron 06/25/2013 12:44 PM

## 2013-06-29 DIAGNOSIS — Z7901 Long term (current) use of anticoagulants: Secondary | ICD-10-CM | POA: Diagnosis not present

## 2013-06-29 DIAGNOSIS — I4891 Unspecified atrial fibrillation: Secondary | ICD-10-CM | POA: Diagnosis not present

## 2013-07-03 ENCOUNTER — Other Ambulatory Visit: Payer: Self-pay | Admitting: *Deleted

## 2013-07-03 ENCOUNTER — Other Ambulatory Visit: Payer: Self-pay | Admitting: Internal Medicine

## 2013-07-04 ENCOUNTER — Ambulatory Visit: Payer: Medicare Other

## 2013-07-04 ENCOUNTER — Encounter: Payer: Self-pay | Admitting: Internal Medicine

## 2013-07-04 ENCOUNTER — Ambulatory Visit (INDEPENDENT_AMBULATORY_CARE_PROVIDER_SITE_OTHER): Payer: Medicare Other | Admitting: *Deleted

## 2013-07-04 DIAGNOSIS — I428 Other cardiomyopathies: Secondary | ICD-10-CM

## 2013-07-04 DIAGNOSIS — I472 Ventricular tachycardia: Secondary | ICD-10-CM

## 2013-07-04 NOTE — H&P (Signed)
Updated without change from office

## 2013-07-04 NOTE — Progress Notes (Signed)
Pacer check in clinic. Changed RV output from 3.0V to 2.5V & pulse width from 0.15ms to 0.3ms due to threshold. Changed Atrial output from 1.5V to 2.0V. No other changes made, full details in PaceArt.   [facility] 10/08/13 & ROV w/ Dr. Graciela Husbands in 1 yr Cody Gomez

## 2013-07-05 LAB — ICD DEVICE OBSERVATION
AL THRESHOLD: 0.5 V
DEV-0020ICD: NEGATIVE
RV LEAD AMPLITUDE: 8.9 mv
RV LEAD THRESHOLD: 1.25 V
TZON-0003FASTVT: 250 ms
TZON-0005SLOWVT: 16
VENTRICULAR PACING ICD: 99.2 pct

## 2013-07-09 ENCOUNTER — Other Ambulatory Visit: Payer: Self-pay | Admitting: Internal Medicine

## 2013-07-10 NOTE — Telephone Encounter (Signed)
If dosing is correct, then yes Dr. Graciela Husbands does fill this.

## 2013-07-10 NOTE — Telephone Encounter (Signed)
Do we fill this?

## 2013-07-18 ENCOUNTER — Telehealth: Payer: Self-pay | Admitting: Internal Medicine

## 2013-07-18 NOTE — Telephone Encounter (Signed)
Spoke with pt, he had a battery change to his ICD he has been having more skipped beats. He reports he feels fine but this is new and he wanted to make sure nothing was going on. He has sent a transmission today. I have left a message for the device nurse to check for the trnsmission and call me back. Pt agreed with this plan.

## 2013-07-18 NOTE — Telephone Encounter (Signed)
New Prob    Pt states his heart is skipping a beat and would like to speak to a nurse regarding this.

## 2013-07-19 NOTE — Telephone Encounter (Signed)
Spoke with pt, aware transmission shows some irregularities on 07-11-13, otherwise everything else was fine. The pt reports that things have settled down. He will cont to monitor and call if he has further problems.

## 2013-07-23 DIAGNOSIS — Z7901 Long term (current) use of anticoagulants: Secondary | ICD-10-CM | POA: Diagnosis not present

## 2013-07-23 DIAGNOSIS — I4891 Unspecified atrial fibrillation: Secondary | ICD-10-CM | POA: Diagnosis not present

## 2013-08-10 ENCOUNTER — Other Ambulatory Visit: Payer: Self-pay | Admitting: Internal Medicine

## 2013-08-13 ENCOUNTER — Other Ambulatory Visit: Payer: Self-pay | Admitting: Internal Medicine

## 2013-08-27 ENCOUNTER — Encounter: Payer: Medicare Other | Admitting: Internal Medicine

## 2013-08-30 DIAGNOSIS — C61 Malignant neoplasm of prostate: Secondary | ICD-10-CM | POA: Diagnosis not present

## 2013-08-30 DIAGNOSIS — R040 Epistaxis: Secondary | ICD-10-CM | POA: Diagnosis not present

## 2013-08-30 DIAGNOSIS — Z79899 Other long term (current) drug therapy: Secondary | ICD-10-CM | POA: Diagnosis not present

## 2013-08-30 DIAGNOSIS — Z7901 Long term (current) use of anticoagulants: Secondary | ICD-10-CM | POA: Diagnosis not present

## 2013-08-30 DIAGNOSIS — Z23 Encounter for immunization: Secondary | ICD-10-CM | POA: Diagnosis not present

## 2013-09-06 ENCOUNTER — Other Ambulatory Visit: Payer: Self-pay | Admitting: Internal Medicine

## 2013-09-10 ENCOUNTER — Other Ambulatory Visit: Payer: Self-pay

## 2013-09-10 MED ORDER — SPIRONOLACTONE 25 MG PO TABS: 25.0000 mg | ORAL_TABLET | Freq: Every day | ORAL | Status: DC

## 2013-09-12 DIAGNOSIS — L821 Other seborrheic keratosis: Secondary | ICD-10-CM | POA: Diagnosis not present

## 2013-09-12 DIAGNOSIS — L570 Actinic keratosis: Secondary | ICD-10-CM | POA: Diagnosis not present

## 2013-09-12 DIAGNOSIS — L578 Other skin changes due to chronic exposure to nonionizing radiation: Secondary | ICD-10-CM | POA: Diagnosis not present

## 2013-09-13 ENCOUNTER — Other Ambulatory Visit: Payer: Self-pay | Admitting: Internal Medicine

## 2013-09-29 DIAGNOSIS — J45901 Unspecified asthma with (acute) exacerbation: Secondary | ICD-10-CM | POA: Diagnosis not present

## 2013-10-01 ENCOUNTER — Ambulatory Visit (INDEPENDENT_AMBULATORY_CARE_PROVIDER_SITE_OTHER): Payer: Medicare Other | Admitting: *Deleted

## 2013-10-01 DIAGNOSIS — Z7901 Long term (current) use of anticoagulants: Secondary | ICD-10-CM | POA: Diagnosis not present

## 2013-10-01 DIAGNOSIS — I428 Other cardiomyopathies: Secondary | ICD-10-CM | POA: Diagnosis not present

## 2013-10-01 DIAGNOSIS — Z95810 Presence of automatic (implantable) cardiac defibrillator: Secondary | ICD-10-CM | POA: Diagnosis not present

## 2013-10-01 DIAGNOSIS — I509 Heart failure, unspecified: Secondary | ICD-10-CM

## 2013-10-08 ENCOUNTER — Other Ambulatory Visit: Payer: Self-pay | Admitting: Internal Medicine

## 2013-10-08 LAB — REMOTE ICD DEVICE
AL THRESHOLD: 0.375 V
BAMS-0001: 170 {beats}/min
BATTERY VOLTAGE: 3.0477 V
DEV-0020ICD: NEGATIVE
FVT: 0
PACEART VT: 2
RV LEAD AMPLITUDE: 9.1 mv
TOT-0001: 0
TZAT-0001ATACH: 1
TZAT-0001ATACH: 3
TZAT-0002ATACH: NEGATIVE
TZAT-0004FASTVT: 8
TZAT-0004SLOWVT: 9
TZAT-0005FASTVT: 88 pct
TZAT-0005SLOWVT: 84 pct
TZAT-0005SLOWVT: 88 pct
TZAT-0011FASTVT: 10 ms
TZAT-0011SLOWVT: 10 ms
TZAT-0011SLOWVT: 10 ms
TZAT-0012ATACH: 150 ms
TZAT-0012ATACH: 150 ms
TZAT-0012FASTVT: 170 ms
TZAT-0012SLOWVT: 170 ms
TZAT-0012SLOWVT: 170 ms
TZAT-0013FASTVT: 1
TZAT-0018ATACH: NEGATIVE
TZAT-0019SLOWVT: 8 V
TZAT-0019SLOWVT: 8 V
TZAT-0020ATACH: 1.5 ms
TZAT-0020ATACH: 1.5 ms
TZON-0003ATACH: 350 ms
TZON-0003SLOWVT: 410 ms
TZST-0001ATACH: 4
TZST-0001ATACH: 6
TZST-0001FASTVT: 5
TZST-0001SLOWVT: 4
TZST-0001SLOWVT: 5
TZST-0001SLOWVT: 6
TZST-0002ATACH: NEGATIVE
TZST-0002ATACH: NEGATIVE
TZST-0002SLOWVT: NEGATIVE
TZST-0002SLOWVT: NEGATIVE
TZST-0003FASTVT: 35 J
TZST-0003FASTVT: 35 J
TZST-0003FASTVT: 35 J

## 2013-10-15 ENCOUNTER — Other Ambulatory Visit: Payer: Self-pay | Admitting: Internal Medicine

## 2013-10-30 ENCOUNTER — Encounter: Payer: Self-pay | Admitting: Critical Care Medicine

## 2013-10-30 ENCOUNTER — Ambulatory Visit (INDEPENDENT_AMBULATORY_CARE_PROVIDER_SITE_OTHER): Payer: Medicare Other | Admitting: Critical Care Medicine

## 2013-10-30 VITALS — BP 122/80 | HR 71 | Temp 97.6°F | Ht 68.0 in | Wt 206.2 lb

## 2013-10-30 DIAGNOSIS — Z7901 Long term (current) use of anticoagulants: Secondary | ICD-10-CM | POA: Diagnosis not present

## 2013-10-30 DIAGNOSIS — IMO0001 Reserved for inherently not codable concepts without codable children: Secondary | ICD-10-CM

## 2013-10-30 DIAGNOSIS — J449 Chronic obstructive pulmonary disease, unspecified: Secondary | ICD-10-CM

## 2013-10-30 MED ORDER — AZELASTINE-FLUTICASONE 137-50 MCG/ACT NA SUSP: 1.0000 | Freq: Two times a day (BID) | NASAL | Status: DC

## 2013-10-30 NOTE — Progress Notes (Signed)
Subjective:    Patient ID: Cody Gomez, male    DOB: 01-13-1931, 79 y.o.   MRN: [id number]  HPI  79 y.o. WM with Copd and reversable components  10/30/2013 Chief Complaint  Patient presents with  . 6 month follow up    Was seen at [facility] 1 month ago d/t increased SOB.  Reports breathing has improved since this.  Noticed SOB this morning, runny nose, and occas prod cough with white mucus.  No wheezing.  Pt rx pred/neb med/depo one month ago at [facility] at [facility].  Now is better.  Pt had more dyspnea and cough.  Mucus was white.  Now has pndrip, sneezing, still productive cough white.  Dyspnea better but persists.     Past Medical History  Diagnosis Date  . Nonischemic dilated cardiomyopathy     Catheterization 2008 demonstrated 40% LAD  . Ventricular tachycardia     Shock therapy delivered to the his ICD; previous failure to tolerate amiodarone and dronaderone  . ICD (implantable cardiac defibrillator) in place 10/29/10    Medtronic-dual-chamber  . GERD (gastroesophageal reflux disease)   . HLD (hyperlipidemia)   . HTN (hypertension)   . Atrial fibrillation     chronic coumadin   . History of prostate cancer   . Hyperthyroidism      Family History  Problem Relation Age of Onset  . Heart disease Mother   . Cancer Father     bladder      History   Social History  . Marital Status: Single    Spouse Name: N/A    Number of Children: N/A  . Years of Education: N/A   Occupational History  . Not on file.   Social History Main Topics  . Smoking status: Former Smoker -- 2.00 packs/day for 38 years    Types: Cigarettes, Pipe, Cigars    Quit date: 12/13/1982  . Smokeless tobacco: Former Neurosurgeon    Types: Chew    Quit date: 12/14/1983     Comment: started when 14; quit in 1984, up to 2 ppd.   . Alcohol Use: Not on file  . Drug Use: Not on file  . Sexual Activity: Not on file   Other Topics Concern  . Not on file   Social History Narrative   Married, 1 son;  retired Naval architect.      Allergies  Allergen Reactions  . Dobutamine   . Penicillins     REACTION: swelling     Outpatient Prescriptions Prior to Visit  Medication Sig Dispense Refill  . atorvastatin (LIPITOR) 10 MG tablet Take 1 tablet by mouth daily.      . carvedilol (COREG) 25 MG tablet Take 1 tablet (25 mg total) by mouth 2 (two) times daily.  180 tablet  3  . dofetilide (TIKOSYN) 500 MCG capsule Take 500 mcg by mouth 2 (two) times daily.      . Fluticasone-Salmeterol (ADVAIR DISKUS) 250-50 MCG/DOSE AEPB Inhale 1 puff into the lungs 2 (two) times daily.  14 each  6  . furosemide (LASIX) 40 MG tablet TAKE 1 TABLET BY MOUTH DAILY  30 tablet  6  . guaiFENesin (MUCINEX) 600 MG 12 hr tablet Take 600 mg by mouth at bedtime.      . Magnesium Chloride (SLOW-MAG PO) Take 71.5 mg by mouth 2 (two) times daily.        . methimazole (TAPAZOLE) 10 MG tablet Take 10 mg by mouth daily.        Marland Kitchen  mexiletine (MEXITIL) 150 MG capsule TAKE ONE CAPSULE BY MOUTH TWICE DAILY  60 capsule  0  . NITROSTAT 0.4 MG SL tablet Place 1 tablet under the tongue every 5 (five) minutes as needed for chest pain.       . potassium chloride SA (K-DUR,KLOR-CON) 20 MEQ tablet TAKE 1 TABLET BY MOUTH TWICE DAILY  60 tablet  6  . spironolactone (ALDACTONE) 25 MG tablet Take 1 tablet (25 mg total) by mouth daily.  90 tablet  3  . warfarin (COUMADIN) 5 MG tablet Take 5 mg by mouth daily.       Marland Kitchen mexiletine (MEXITIL) 150 MG capsule Take 150 mg by mouth 2 (two) times daily.      Marland Kitchen albuterol (PROVENTIL) (2.5 MG/3ML) 0.083% nebulizer solution Take 2.5 mg by nebulization every 6 (six) hours as needed for shortness of breath.       . naproxen sodium (ANAPROX) 220 MG tablet Take 440 mg by mouth 2 (two) times daily as needed (for pain).       No facility-administered medications prior to visit.      Review of Systems  Constitutional:   No  weight loss, night sweats,  Fevers, chills, fatigue, lassitude. HEENT:   No headaches,   Difficulty swallowing,  Tooth/dental problems,  Sore throat,                No sneezing, itching, ear ache, nasal congestion, post nasal drip,   CV:  No chest pain,  Orthopnea, PND, swelling in lower extremities, anasarca, dizziness, palpitations  GI  No heartburn, indigestion, abdominal pain, nausea, vomiting, diarrhea, change in bowel habits, loss of appetite  Resp: Notes  shortness of breath with exertion not at rest.  Notes excess mucus, notes productive cough,  No non-productive cough,  No coughing up of blood.  No change in color of mucus.  No wheezing.  No chest wall deformity  Skin: no rash or lesions.  GU: no dysuria, change in color of urine, no urgency or frequency.  No flank pain.  MS:  No joint pain or swelling.  No decreased range of motion.  No back pain.  Psych:  No change in mood or affect. No depression or anxiety.  No memory loss.     Objective:   Physical Exam  Filed Vitals:   10/30/13 0958  BP: 122/80  Pulse: 71  Temp: 97.6 F (36.4 C)  TempSrc: Oral  Height: 5\' 8"  (1.727 m)  Weight: 206 lb 3.2 oz (93.532 kg)  SpO2: 96%    Gen: Pleasant, well-nourished, in no distress,  normal affect  ENT: No lesions,  mouth clear,  oropharynx clear, no postnasal drip  Neck: No JVD, no TMG, no carotid bruits  Lungs: No use of accessory muscles, no dullness to percussion, distant BS, expired wheezes poor airflow  Cardiovascular: RRR, heart sounds normal, no murmur or gallops, no peripheral edema  Abdomen: soft and NT, no HSM,  BS normal  Musculoskeletal: No deformities, no cyanosis or clubbing  Neuro: alert, non focal  Skin: Warm, no lesions or rashes       Assessment & Plan:   COPD with bronchitis Gold stage B Gold stage B. COPD stable at this time Mild allergic rhinitis Plan Maintain Advair daily Trial of dymista one puff twice daily    Updated Medication List Outpatient Encounter Prescriptions as of 10/30/2013  Medication Sig  . atorvastatin  (LIPITOR) 10 MG tablet Take 1 tablet by mouth daily.  . carvedilol (COREG)  25 MG tablet Take 1 tablet (25 mg total) by mouth 2 (two) times daily.  Marland Kitchen dofetilide (TIKOSYN) 500 MCG capsule Take 500 mcg by mouth 2 (two) times daily.  . Fluticasone-Salmeterol (ADVAIR DISKUS) 250-50 MCG/DOSE AEPB Inhale 1 puff into the lungs 2 (two) times daily.  . furosemide (LASIX) 40 MG tablet TAKE 1 TABLET BY MOUTH DAILY  . guaiFENesin (MUCINEX) 600 MG 12 hr tablet Take 600 mg by mouth at bedtime.  . Magnesium Chloride (SLOW-MAG PO) Take 71.5 mg by mouth 2 (two) times daily.    . methimazole (TAPAZOLE) 10 MG tablet Take 10 mg by mouth daily.    Marland Kitchen mexiletine (MEXITIL) 150 MG capsule TAKE ONE CAPSULE BY MOUTH TWICE DAILY  . NITROSTAT 0.4 MG SL tablet Place 1 tablet under the tongue every 5 (five) minutes as needed for chest pain.   . potassium chloride SA (K-DUR,KLOR-CON) 20 MEQ tablet TAKE 1 TABLET BY MOUTH TWICE DAILY  . spironolactone (ALDACTONE) 25 MG tablet Take 1 tablet (25 mg total) by mouth daily.  Marland Kitchen warfarin (COUMADIN) 5 MG tablet Take 5 mg by mouth daily.   . [DISCONTINUED] mexiletine (MEXITIL) 150 MG capsule Take 150 mg by mouth 2 (two) times daily.  Marland Kitchen albuterol (PROVENTIL) (2.5 MG/3ML) 0.083% nebulizer solution Take 2.5 mg by nebulization every 6 (six) hours as needed for shortness of breath.   . Azelastine-Fluticasone (DYMISTA) 137-50 MCG/ACT SUSP Place 1 puff into the nose 2 (two) times daily.  . [DISCONTINUED] naproxen sodium (ANAPROX) 220 MG tablet Take 440 mg by mouth 2 (two) times daily as needed (for pain).

## 2013-10-30 NOTE — Patient Instructions (Signed)
Use Dymista one puff twice daily (use samples until gone) Stay on advair No other medication changes Return 4 months

## 2013-10-31 NOTE — Assessment & Plan Note (Signed)
Gold stage B. COPD stable at this time Mild allergic rhinitis Plan Maintain Advair daily Trial of dymista one puff twice daily

## 2013-11-16 ENCOUNTER — Other Ambulatory Visit: Payer: Self-pay | Admitting: Internal Medicine

## 2013-12-03 DIAGNOSIS — Z7901 Long term (current) use of anticoagulants: Secondary | ICD-10-CM | POA: Diagnosis not present

## 2013-12-24 ENCOUNTER — Other Ambulatory Visit: Payer: Self-pay | Admitting: Internal Medicine

## 2013-12-28 DIAGNOSIS — Z7901 Long term (current) use of anticoagulants: Secondary | ICD-10-CM | POA: Diagnosis not present

## 2014-01-04 ENCOUNTER — Ambulatory Visit (INDEPENDENT_AMBULATORY_CARE_PROVIDER_SITE_OTHER): Payer: Medicare Other | Admitting: *Deleted

## 2014-01-04 LAB — MDC_IDC_ENUM_SESS_TYPE_REMOTE
Battery Remaining Longevity: 89 mo
Battery Voltage: 3.02 V
Brady Statistic AS VP Percent: 0.82 %
Brady Statistic RA Percent Paced: 98.99 %
Date Time Interrogation Session: 20150123051706
HIGH POWER IMPEDANCE MEASURED VALUE: 285 Ohm
HighPow Impedance: 57 Ohm
HighPow Impedance: 72 Ohm
Lead Channel Impedance Value: 456 Ohm
Lead Channel Impedance Value: 646 Ohm
Lead Channel Pacing Threshold Amplitude: 0.375 V
Lead Channel Pacing Threshold Amplitude: 1.375 V
Lead Channel Pacing Threshold Pulse Width: 0.4 ms
Lead Channel Sensing Intrinsic Amplitude: 1.875 mV
Lead Channel Sensing Intrinsic Amplitude: 1.875 mV
Lead Channel Setting Pacing Amplitude: 1.5 V
MDC IDC MSMT LEADCHNL RV PACING THRESHOLD PULSEWIDTH: 0.4 ms
MDC IDC MSMT LEADCHNL RV SENSING INTR AMPL: 8.25 mV
MDC IDC MSMT LEADCHNL RV SENSING INTR AMPL: 8.25 mV
MDC IDC SET LEADCHNL RV PACING AMPLITUDE: 2.75 V
MDC IDC SET LEADCHNL RV PACING PULSEWIDTH: 0.4 ms
MDC IDC SET LEADCHNL RV SENSING SENSITIVITY: 0.45 mV
MDC IDC SET ZONE DETECTION INTERVAL: 350 ms
MDC IDC SET ZONE DETECTION INTERVAL: 450 ms
MDC IDC STAT BRADY AP VP PERCENT: 96.45 %
MDC IDC STAT BRADY AP VS PERCENT: 2.55 %
MDC IDC STAT BRADY AS VS PERCENT: 0.18 %
MDC IDC STAT BRADY RV PERCENT PACED: 97.27 %
Zone Setting Detection Interval: 250 ms
Zone Setting Detection Interval: 290 ms
Zone Setting Detection Interval: 410 ms

## 2014-01-07 ENCOUNTER — Other Ambulatory Visit: Payer: Self-pay | Admitting: Internal Medicine

## 2014-01-07 ENCOUNTER — Encounter: Payer: Self-pay | Admitting: Internal Medicine

## 2014-01-07 ENCOUNTER — Ambulatory Visit (INDEPENDENT_AMBULATORY_CARE_PROVIDER_SITE_OTHER): Payer: Medicare Other | Admitting: Internal Medicine

## 2014-01-07 VITALS — BP 114/73 | HR 90 | Ht 67.0 in | Wt 203.0 lb

## 2014-01-07 DIAGNOSIS — I428 Other cardiomyopathies: Secondary | ICD-10-CM

## 2014-01-07 DIAGNOSIS — I472 Ventricular tachycardia, unspecified: Secondary | ICD-10-CM

## 2014-01-07 DIAGNOSIS — Z4502 Encounter for adjustment and management of automatic implantable cardiac defibrillator: Secondary | ICD-10-CM

## 2014-01-07 DIAGNOSIS — I4891 Unspecified atrial fibrillation: Secondary | ICD-10-CM

## 2014-01-07 DIAGNOSIS — I4729 Other ventricular tachycardia: Secondary | ICD-10-CM

## 2014-01-07 DIAGNOSIS — I509 Heart failure, unspecified: Secondary | ICD-10-CM | POA: Diagnosis not present

## 2014-01-07 DIAGNOSIS — R0602 Shortness of breath: Secondary | ICD-10-CM

## 2014-01-07 LAB — BASIC METABOLIC PANEL
BUN: 17 mg/dL (ref 6–23)
CALCIUM: 9.2 mg/dL (ref 8.4–10.5)
CO2: 25 mEq/L (ref 19–32)
Chloride: 107 mEq/L (ref 96–112)
Creatinine, Ser: 1.1 mg/dL (ref 0.4–1.5)
GFR: 69.81 mL/min (ref >60.00)
GLUCOSE: 90 mg/dL (ref 70–99)
Potassium: 4.8 mEq/L (ref 3.5–5.1)
Sodium: 137 mEq/L (ref 135–145)

## 2014-01-07 LAB — MAGNESIUM: Magnesium: 2.3 mg/dL (ref 1.5–2.5)

## 2014-01-07 LAB — BRAIN NATRIURETIC PEPTIDE: Pro B Natriuretic peptide (BNP): 39 pg/mL (ref 0.0–100.0)

## 2014-01-07 NOTE — Assessment & Plan Note (Signed)
Continue current meds 

## 2014-01-07 NOTE — Progress Notes (Signed)
Patient Care Team: Carlus Pavlov as PCP - General (Unknown Physician Specialty) Elsie Stain, MD (Pulmonary Disease)   HPI  Cody Gomez is a 80 y.o. male seen in followup for nonischemic myopathy and ventricular tachycardia as well as atrial fibrillation. He was started on Tikosyn  He was having ventricular tachycardia above and below his detection rate so was decreased to 420   He underwent device generator replacement notwithstanding intercurrent normalization of LV function 7/14  He is having ongoing VT which prompted mexilitene added to dofetilide  His complaints are of DOE and fatigue  Without edema or PND or chestpain    Last BMET  7/14   Past Medical History  Diagnosis Date  . Nonischemic dilated cardiomyopathy     Catheterization 2008 demonstrated 40% LAD  . Ventricular tachycardia     Shock therapy delivered to the his ICD; previous failure to tolerate amiodarone and dronaderone  . ICD (implantable cardiac defibrillator) in place 10/29/10    Medtronic-dual-chamber  . GERD (gastroesophageal reflux disease)   . HLD (hyperlipidemia)   . HTN (hypertension)   . Atrial fibrillation     chronic coumadin   . History of prostate cancer   . Hyperthyroidism     Past Surgical History  Procedure Laterality Date  . Prostatectomy    . Pacemaker insertion      medtronic  . Appendectomy    . Hernia repair      Current Outpatient Prescriptions  Medication Sig Dispense Refill  . albuterol (PROVENTIL) (2.5 MG/3ML) 0.083% nebulizer solution Take 2.5 mg by nebulization every 6 (six) hours as needed for shortness of breath.       Marland Kitchen atorvastatin (LIPITOR) 10 MG tablet Take 1 tablet by mouth daily.      . Azelastine-Fluticasone (DYMISTA) 137-50 MCG/ACT SUSP Place 1 puff into the nose 2 (two) times daily.  7 g  1  . carvedilol (COREG) 25 MG tablet Take 1 tablet (25 mg total) by mouth 2 (two) times daily.  180 tablet  3  . dofetilide (TIKOSYN) 500 MCG capsule Take 500  mcg by mouth 2 (two) times daily.      . Fluticasone-Salmeterol (ADVAIR DISKUS) 250-50 MCG/DOSE AEPB Inhale 1 puff into the lungs 2 (two) times daily.  14 each  6  . furosemide (LASIX) 40 MG tablet TAKE 1 TABLET BY MOUTH DAILY  30 tablet  6  . guaiFENesin (MUCINEX) 600 MG 12 hr tablet Take 600 mg by mouth at bedtime.      . Magnesium Chloride (SLOW-MAG PO) Take 71.5 mg by mouth 2 (two) times daily.        . methimazole (TAPAZOLE) 10 MG tablet Take 10 mg by mouth daily.        Marland Kitchen mexiletine (MEXITIL) 150 MG capsule TAKE ONE CAPSULE BY MOUTH TWICE DAILY  60 capsule  7  . NITROSTAT 0.4 MG SL tablet Place 1 tablet under the tongue every 5 (five) minutes as needed for chest pain.       . potassium chloride SA (K-DUR,KLOR-CON) 20 MEQ tablet TAKE 1 TABLET BY MOUTH TWICE DAILY  60 tablet  6  . spironolactone (ALDACTONE) 25 MG tablet Take 1 tablet (25 mg total) by mouth daily.  90 tablet  3  . warfarin (COUMADIN) 5 MG tablet Take 5 mg by mouth daily.        No current facility-administered medications for this visit.    Allergies  Allergen Reactions  . Dobutamine   .  Penicillins     REACTION: swelling    Review of Systems negative except from HPI and PMH  Physical Exam BP 114/73  Pulse 90  Ht 5\' 7"  (1.702 m)  Wt 203 lb (92.08 kg)  BMI 31.79 kg/m2 Well developed and well nourished in no acute distress HENT normal E scleral and icterus clear Neck Supple JVP flat; carotids brisk and full Clear to ausculation  Regular rate and rhythm, no murmurs gallops or rub Soft with active bowel sounds No clubbing cyanosis none Edema Alert and oriented, grossly normal motor and sensory function Skin Warm and Dry  ECG av pacing  Assessment and  Plan

## 2014-01-07 NOTE — Assessment & Plan Note (Signed)
As above The patient's device was interrogated and the information was fully reviewed.  The device was reprogrammed to  Change raae response algorithm

## 2014-01-07 NOTE — Patient Instructions (Addendum)
Your physician recommends that you have lab work today: BNP/BMET/Mag  Your physician has requested that you have a [place]. For further information please visit [email or web address]. Please follow instruction sheet, as given.  Your physician recommends that you schedule a follow-up appointment in: 3-4 weeks with Dr. Rayann Heman (new eval A Fib)  Remote monitoring is used to monitor your Pacemaker of ICD from home. This monitoring reduces the number of office visits required to check your device to one time per year. It allows [place] to keep an eye on the functioning of your device to ensure it is working properly. You are scheduled for a device check from home on 04/10/14. You may send your transmission at any time that day. If you have a wireless device, the transmission will be sent automatically. After your physician reviews your transmission, you will receive a postcard with your next transmission date.   Keep follow up in summer with Dr. [name].

## 2014-01-07 NOTE — Assessment & Plan Note (Signed)
With recurrent VT  Will check labs today and refer to Dr Greggory Brandy following Cody Gomez for consideratio of ablative therapy as he is on two drugs for VT and with 35 episodes int he last few months  Treated with atp

## 2014-01-07 NOTE — Assessment & Plan Note (Signed)
As above will check BNP and also have reporgrammed device as HR response was overly aggressvie (  HR 105 with hall walk)

## 2014-01-08 LAB — MDC_IDC_ENUM_SESS_TYPE_INCLINIC

## 2014-01-09 ENCOUNTER — Encounter: Payer: Self-pay | Admitting: Internal Medicine

## 2014-01-15 NOTE — Addendum Note (Signed)
Addended by: Sharlot Gowda on: 01/15/2014 11:22 AM   Modules accepted: Level of Service

## 2014-01-17 ENCOUNTER — Telehealth: Payer: Self-pay | Admitting: Critical Care Medicine

## 2014-01-17 NOTE — Telephone Encounter (Signed)
Pt has a ROV 3 mos appt in [place] with Dr. Joya Gaskins.  Pt verbalized understanding & nothing further needed at this time.  Cody Gomez

## 2014-01-18 DIAGNOSIS — R52 Pain, unspecified: Secondary | ICD-10-CM | POA: Diagnosis not present

## 2014-01-18 DIAGNOSIS — Z7901 Long term (current) use of anticoagulants: Secondary | ICD-10-CM | POA: Diagnosis not present

## 2014-01-18 DIAGNOSIS — M549 Dorsalgia, unspecified: Secondary | ICD-10-CM | POA: Diagnosis not present

## 2014-01-22 ENCOUNTER — Ambulatory Visit (HOSPITAL_COMMUNITY): Payer: Medicare Other | Attending: Internal Medicine | Admitting: Radiology

## 2014-01-22 VITALS — BP 128/86 | HR 71 | Ht 67.0 in | Wt 199.0 lb

## 2014-01-22 DIAGNOSIS — I4729 Other ventricular tachycardia: Secondary | ICD-10-CM | POA: Insufficient documentation

## 2014-01-22 DIAGNOSIS — I2510 Atherosclerotic heart disease of native coronary artery without angina pectoris: Secondary | ICD-10-CM | POA: Diagnosis not present

## 2014-01-22 DIAGNOSIS — I4891 Unspecified atrial fibrillation: Secondary | ICD-10-CM | POA: Insufficient documentation

## 2014-01-22 DIAGNOSIS — E785 Hyperlipidemia, unspecified: Secondary | ICD-10-CM | POA: Insufficient documentation

## 2014-01-22 DIAGNOSIS — R0609 Other forms of dyspnea: Secondary | ICD-10-CM | POA: Insufficient documentation

## 2014-01-22 DIAGNOSIS — R079 Chest pain, unspecified: Secondary | ICD-10-CM

## 2014-01-22 DIAGNOSIS — Z87891 Personal history of nicotine dependence: Secondary | ICD-10-CM | POA: Insufficient documentation

## 2014-01-22 DIAGNOSIS — R0989 Other specified symptoms and signs involving the circulatory and respiratory systems: Secondary | ICD-10-CM | POA: Insufficient documentation

## 2014-01-22 DIAGNOSIS — R0602 Shortness of breath: Secondary | ICD-10-CM

## 2014-01-22 DIAGNOSIS — R0789 Other chest pain: Secondary | ICD-10-CM | POA: Diagnosis not present

## 2014-01-22 DIAGNOSIS — Z95810 Presence of automatic (implantable) cardiac defibrillator: Secondary | ICD-10-CM | POA: Insufficient documentation

## 2014-01-22 DIAGNOSIS — R5383 Other fatigue: Secondary | ICD-10-CM

## 2014-01-22 DIAGNOSIS — I472 Ventricular tachycardia, unspecified: Secondary | ICD-10-CM | POA: Insufficient documentation

## 2014-01-22 DIAGNOSIS — I10 Essential (primary) hypertension: Secondary | ICD-10-CM | POA: Insufficient documentation

## 2014-01-22 DIAGNOSIS — J449 Chronic obstructive pulmonary disease, unspecified: Secondary | ICD-10-CM | POA: Diagnosis not present

## 2014-01-22 DIAGNOSIS — J4489 Other specified chronic obstructive pulmonary disease: Secondary | ICD-10-CM | POA: Insufficient documentation

## 2014-01-22 DIAGNOSIS — I428 Other cardiomyopathies: Secondary | ICD-10-CM | POA: Diagnosis not present

## 2014-01-22 DIAGNOSIS — I509 Heart failure, unspecified: Secondary | ICD-10-CM

## 2014-01-22 DIAGNOSIS — R5381 Other malaise: Secondary | ICD-10-CM | POA: Diagnosis not present

## 2014-01-22 MED ORDER — TECHNETIUM TC 99M SESTAMIBI GENERIC - CARDIOLITE
30.0000 | Freq: Once | INTRAVENOUS | Status: AC | PRN
  Administered 2014-01-22: 30 via INTRAVENOUS

## 2014-01-22 MED ORDER — TECHNETIUM TC 99M SESTAMIBI GENERIC - CARDIOLITE
10.0000 | Freq: Once | INTRAVENOUS | Status: AC | PRN
  Administered 2014-01-22: 10 via INTRAVENOUS

## 2014-01-22 MED ORDER — ADENOSINE (DIAGNOSTIC) 3 MG/ML IV SOLN
0.5600 mg/kg | Freq: Once | INTRAVENOUS | Status: AC
  Administered 2014-01-22: 50.7 mg via INTRAVENOUS

## 2014-01-22 NOTE — Progress Notes (Signed)
[facility] [address] [place], [place] [address] [phone]    Cardiology Nuclear Med Study  Cody Gomez is a 80 y.o. male     MRN : [id number]     DOB: 1930/12/20  Procedure Date: 01/22/2014  Nuclear Med Background Indication for Stress Test:  Evaluation for Ischemia and Possible Ablation for continued VT History:  CAD, AICD, hx. Afib, hx. VT, Echo 2014 EF 50-55%, Asthma, COPD, Cardiomyopathy Cardiac Risk Factors: History of Smoking, Hypertension and Lipids  Symptoms:  Chest Tightness (last date of chest discomfort was two months ago), DOE and Fatigue   Nuclear Pre-Procedure Caffeine/Decaff Intake:  9:00pm NPO After: 9:00pm   Lungs:  clear O2 Sat: 96% on room air. IV 0.9% NS with Angio Cath:  20g  IV Site: R Antecubital  IV Started by:  Matilde Haymaker, RN  Chest Size (in):  44 Cup Size: n/a  Height: 5\' 7"  (1.702 m)  Weight:  199 lb (90.266 kg)  BMI:  Body mass index is 31.16 kg/(m^2). Tech Comments:  Coreg taken at 0700    Nuclear Med Study 1 or 2 day study: 1 day  Stress Test Type:  Adenosine  Reading MD: n/a  Order Authorizing Provider:  Herbert Pun  Resting Radionuclide: Technetium 24m Sestamibi  Resting Radionuclide Dose: 11.0 mCi   Stress Radionuclide:  Technetium 8m Sestamibi  Stress Radionuclide Dose: 32.8 mCi           Stress Protocol Rest HR: 71 Stress HR: 72  Rest BP: 128/86 Stress BP: 135/83  Exercise Time (min): n/a METS: n/a           Dose of Adenosine (mg):  50.7 mg Dose of Lexiscan: n/a mg  Dose of Atropine (mg): n/a Dose of Dobutamine: n/a mcg/kg/min (at max HR)  Stress Test Technologist: Glade Lloyd, BS-ES  Nuclear Technologist:  Annye Rusk, CNMT     Rest Procedure:  Myocardial perfusion imaging was performed at rest 45 minutes following the intravenous administration of Technetium 64m Sestamibi. Rest ECG: AV pacing RBBB  Stress Procedure:  The patient received IV adenosine at 140 mcg/kg/min  for 4 minutes.  Technetium 28m Sestamibi was injected at the 2 minute mark and quantitative spect images were obtained after a 45 minute delay.  During the infusion of Adenosine, the patient complained of SOB, a burning sensation in his mouth, flushed and stomach pain.  All resolved with the exception of some SOB in recovery.  Stress ECG: No significant change from baseline ECG  QPS Raw Data Images:  Patient motion noted. Stress Images:  There is decreased uptake in the inferior wall. Rest Images:  There is decreased uptake in the inferior wall. Subtraction (SDS):  There is a fixed defect that is most consistent with a previous infarction. Transient Ischemic Dilatation (Normal <1.22):  1.04 Lung/Heart Ratio (Normal <0.45):  0.36  Quantitative Gated Spect Images QGS EDV:  134 ml QGS ESV:  70 ml  Impression Exercise Capacity:  Adenosine study with no exercise. BP Response:  Normal blood pressure response. Clinical Symptoms:  There is dyspnea. ECG Impression:  No significant ST segment change suggestive of ischemia. Comparison with Prior Nuclear Study: No previous nuclear study performed  Overall Impression:  Low risk stress nuclear study Small inferior wall infarct at mid and basal level with no ischemia.  LV Ejection Fraction: 48%.  LV Wall Motion:  Mild decrease in EF with diffuse hypokinesis   .Jenkins Rouge

## 2014-01-29 ENCOUNTER — Ambulatory Visit: Payer: Medicare Other | Admitting: Critical Care Medicine

## 2014-02-04 ENCOUNTER — Other Ambulatory Visit: Payer: Self-pay | Admitting: Internal Medicine

## 2014-02-04 ENCOUNTER — Encounter: Payer: Self-pay | Admitting: *Deleted

## 2014-02-04 ENCOUNTER — Encounter: Payer: Self-pay | Admitting: Internal Medicine

## 2014-02-04 ENCOUNTER — Ambulatory Visit (INDEPENDENT_AMBULATORY_CARE_PROVIDER_SITE_OTHER): Payer: Medicare Other | Admitting: Internal Medicine

## 2014-02-04 VITALS — BP 102/62 | HR 71 | Ht 67.0 in | Wt 199.0 lb

## 2014-02-04 DIAGNOSIS — I428 Other cardiomyopathies: Secondary | ICD-10-CM | POA: Diagnosis not present

## 2014-02-04 DIAGNOSIS — I4729 Other ventricular tachycardia: Secondary | ICD-10-CM | POA: Diagnosis not present

## 2014-02-04 DIAGNOSIS — I472 Ventricular tachycardia, unspecified: Secondary | ICD-10-CM

## 2014-02-04 DIAGNOSIS — I4891 Unspecified atrial fibrillation: Secondary | ICD-10-CM

## 2014-02-04 DIAGNOSIS — I10 Essential (primary) hypertension: Secondary | ICD-10-CM | POA: Diagnosis not present

## 2014-02-04 LAB — CBC WITH DIFFERENTIAL/PLATELET
BASOS PCT: 0.9 % (ref 0.0–3.0)
Basophils Absolute: 0.1 10*3/uL (ref 0.0–0.1)
EOS ABS: 0.6 10*3/uL (ref 0.0–0.7)
Eosinophils Relative: 8 % — ABNORMAL HIGH (ref 0.0–5.0)
HEMATOCRIT: 42.5 % (ref 39.0–52.0)
HEMOGLOBIN: 13.8 g/dL (ref 13.0–17.0)
LYMPHS ABS: 2 10*3/uL (ref 0.7–4.0)
Lymphocytes Relative: 25.9 % (ref 12.0–46.0)
MCHC: 32.4 g/dL (ref 30.0–36.0)
MCV: 92.3 fl (ref 78.0–100.0)
MONO ABS: 0.8 10*3/uL (ref 0.1–1.0)
Monocytes Relative: 10.5 % (ref 3.0–12.0)
NEUTROS ABS: 4.3 10*3/uL (ref 1.4–7.7)
Neutrophils Relative %: 54.7 % (ref 43.0–77.0)
Platelets: 318 10*3/uL (ref 150.0–400.0)
RBC: 4.6 Mil/uL (ref 4.22–5.81)
RDW: 14 % (ref 11.5–14.6)
WBC: 7.8 10*3/uL (ref 4.5–10.5)

## 2014-02-04 LAB — PROTIME-INR
INR: 2.8 ratio — ABNORMAL HIGH (ref 0.8–1.0)
Prothrombin Time: 28.8 s — ABNORMAL HIGH (ref 10.2–12.4)

## 2014-02-04 LAB — MDC_IDC_ENUM_SESS_TYPE_INCLINIC
Brady Statistic AP VS Percent: 1.51 %
Brady Statistic AS VP Percent: 0.51 %
Brady Statistic AS VS Percent: 0.12 %
HighPow Impedance: 285 Ohm
HighPow Impedance: 59 Ohm
HighPow Impedance: 73 Ohm
Lead Channel Impedance Value: 418 Ohm
Lead Channel Pacing Threshold Amplitude: 1.375 V
Lead Channel Pacing Threshold Pulse Width: 0.4 ms
Lead Channel Sensing Intrinsic Amplitude: 1.75 mV
Lead Channel Sensing Intrinsic Amplitude: 8.625 mV
Lead Channel Setting Pacing Amplitude: 1.5 V
MDC IDC MSMT BATTERY REMAINING LONGEVITY: 88 mo
MDC IDC MSMT BATTERY VOLTAGE: 3.01 V
MDC IDC MSMT LEADCHNL RA PACING THRESHOLD AMPLITUDE: 0.375 V
MDC IDC MSMT LEADCHNL RA PACING THRESHOLD PULSEWIDTH: 0.4 ms
MDC IDC MSMT LEADCHNL RA SENSING INTR AMPL: 1.75 mV
MDC IDC MSMT LEADCHNL RV IMPEDANCE VALUE: 703 Ohm
MDC IDC MSMT LEADCHNL RV SENSING INTR AMPL: 8.625 mV
MDC IDC SESS DTM: 20150223111820
MDC IDC SET LEADCHNL RV PACING AMPLITUDE: 2.75 V
MDC IDC SET LEADCHNL RV PACING PULSEWIDTH: 0.4 ms
MDC IDC SET LEADCHNL RV SENSING SENSITIVITY: 0.45 mV
MDC IDC SET ZONE DETECTION INTERVAL: 250 ms
MDC IDC SET ZONE DETECTION INTERVAL: 410 ms
MDC IDC STAT BRADY AP VP PERCENT: 97.86 %
MDC IDC STAT BRADY RA PERCENT PACED: 99.38 %
MDC IDC STAT BRADY RV PERCENT PACED: 98.37 %
Zone Setting Detection Interval: 290 ms
Zone Setting Detection Interval: 350 ms
Zone Setting Detection Interval: 450 ms

## 2014-02-04 LAB — BASIC METABOLIC PANEL
BUN: 20 mg/dL (ref 6–23)
CO2: 24 meq/L (ref 19–32)
Calcium: 9.4 mg/dL (ref 8.4–10.5)
Chloride: 104 mEq/L (ref 96–112)
Creatinine, Ser: 1 mg/dL (ref 0.4–1.5)
GFR: 74.55 mL/min (ref >60.00)
Glucose, Bld: 89 mg/dL (ref 70–99)
Potassium: 4.4 mEq/L (ref 3.5–5.1)
SODIUM: 135 meq/L (ref 135–145)

## 2014-02-04 NOTE — Patient Instructions (Signed)

## 2014-02-04 NOTE — Progress Notes (Signed)
PCP:  Carlus Pavlov, MD Primary Cardiologist/EP- Dr [name]  The patient presents today for electrophysiology followup.  He has had difficulty with sustained VT for which he has received ICD therapy.  He is referred by Dr [name] for VT ablation.  He has failed medical therapy with amiodarone and multaq.  Presently, he is on tikosyn, mexiletine, and ranexa.  Despite this therapy, he has ongoing VT.  Device interrogation reveals VT at 330-350 msec. Since last being seen in our clinic, the patient reports doing very well.  Today, he denies symptoms of palpitations, chest pain, shortness of breath, orthopnea, PND, lower extremity edema, dizziness, presyncope, syncope, or neurologic sequela.  The patient feels that he is tolerating medications without difficulties and is otherwise without complaint today.   Past Medical History  Diagnosis Date  . Nonischemic dilated cardiomyopathy     Catheterization 2008 demonstrated 40% LAD  . Ventricular tachycardia     Shock therapy delivered to the his ICD; previous failure to tolerate amiodarone and dronaderone  . ICD (implantable cardiac defibrillator) in place 10/29/10    Medtronic-dual-chamber  . GERD (gastroesophageal reflux disease)   . HLD (hyperlipidemia)   . HTN (hypertension)   . Atrial fibrillation     chronic coumadin   . History of prostate cancer   . Hyperthyroidism    Past Surgical History  Procedure Laterality Date  . Prostatectomy    . Cardiac defibrillator placement  07/18/2007    MDT ICD implanted with a [id number] [id number] RV leald  . Appendectomy    . Hernia repair    . Implantable cardioverter defibrillator generator change  06/22/13    medtronic Evera XT DR generator change peformed by Dr [name]    Current Outpatient Prescriptions  Medication Sig Dispense Refill  . albuterol (PROVENTIL) (2.5 MG/3ML) 0.083% nebulizer solution Take 2.5 mg by nebulization every 6 (six) hours as needed for shortness of breath.       Marland Kitchen atorvastatin (LIPITOR) 10  MG tablet Take 1 tablet by mouth daily.      . Azelastine-Fluticasone (DYMISTA) 137-50 MCG/ACT SUSP Place 1 puff into the nose 2 (two) times daily.  7 g  1  . carvedilol (COREG) 25 MG tablet Take 1 tablet (25 mg total) by mouth 2 (two) times daily.  180 tablet  3  . dofetilide (TIKOSYN) 500 MCG capsule Take 500 mcg by mouth 2 (two) times daily.      . Fluticasone-Salmeterol (ADVAIR DISKUS) 250-50 MCG/DOSE AEPB Inhale 1 puff into the lungs 2 (two) times daily.  14 each  6  . furosemide (LASIX) 40 MG tablet TAKE 1 TABLET BY MOUTH DAILY  30 tablet  6  . guaiFENesin (MUCINEX) 600 MG 12 hr tablet Take 600 mg by mouth at bedtime.      . Magnesium Chloride (SLOW-MAG PO) Take 71.5 mg by mouth 2 (two) times daily.        . methimazole (TAPAZOLE) 10 MG tablet Take 10 mg by mouth daily.        Marland Kitchen mexiletine (MEXITIL) 150 MG capsule TAKE ONE CAPSULE BY MOUTH TWICE DAILY  60 capsule  7  . NITROSTAT 0.4 MG SL tablet Place 1 tablet under the tongue every 5 (five) minutes as needed for chest pain.       . potassium chloride SA (K-DUR,KLOR-CON) 20 MEQ tablet TAKE 1 TABLET BY MOUTH TWICE DAILY  60 tablet  6  . spironolactone (ALDACTONE) 25 MG tablet Take 1 tablet (25 mg total) by mouth daily.  90 tablet  3  . warfarin (COUMADIN) 5 MG tablet Take 5 mg by mouth daily.        No current facility-administered medications for this visit.    Allergies  Allergen Reactions  . Dobutamine Other (See Comments)    Heart stopped  . Penicillins     REACTION: swelling    History   Social History  . Marital Status: Single    Spouse Name: N/A    Number of Children: N/A  . Years of Education: N/A   Occupational History  . Not on file.   Social History Main Topics  . Smoking status: Former Smoker -- 2.00 packs/day for 38 years    Types: Cigarettes, Pipe, Cigars    Quit date: 12/13/1982  . Smokeless tobacco: Former Systems developer    Types: [place] date: 12/14/1983     Comment: started when 14; quit in 1984, up to 2  ppd.   . Alcohol Use: No  . Drug Use: No  . Sexual Activity: Not on file   Other Topics Concern  . Not on file   Social History Narrative   Married, 1 son; retired Administrator.     Family History  Problem Relation Age of Onset  . Heart disease Mother   . Cancer Father     bladder     ROS-  All systems are reviewed and are negative except as outlined in the HPI above  Physical Exam: Filed Vitals:   02/04/14 0850  BP: 102/62  Pulse: 71  Height: 5\' 7"  (1.702 m)  Weight: 199 lb (90.266 kg)    GEN- The patient is elderly appearing, alert and oriented x 3 today.   Head- normocephalic, atraumatic Eyes-  Sclera clear, conjunctiva pink Ears- hearing intact Oropharynx- clear Neck- supple,  Lungs- Clear to ausculation bilaterally, normal work of breathing Heart- Regular rate and rhythm, no murmurs, rubs or gallops, PMI not laterally displaced GI- soft, NT, ND, + BS Extremities- no clubbing, cyanosis, or edema MS- no significant deformity or atrophy Skin- no rash or lesion Psych- euthymic mood, full affect Neuro- strength and sensation are intact  Device interrogation is reviewed today  (see paceart) ekg today reveals av sequential pacing  Assessment and Plan:  1. VT The patient has recurrent nonischemic VT.  He has failed medical therapy with multiple AADs. Therapeutic strategies for ventricular tachycardia including medicine and ablation were discussed in detail with the patient today. Risk, benefits, and alternatives to EP study and radiofrequency ablation were also discussed in detail today. These risks include but are not limited to stroke, bleeding, vascular damage, tamponade, perforation, damage to the heart and other structures, AV block, ICD lead dislodgement, worsening renal function, and death. The patient understands these risk and wishes to proceed.  We will therefore proceed with catheter ablation at the next available time.  He is instructed to hold coreg and  mexiletine prior to the procedure.  We will need carto and anesthesia for this procedure.  2. Nonischemic CM Recent myoview is reviewed and reveals a small inferior scar.  We will keep this in mind as we proceed with ablation.  3. HTN Stable No change required today  4. afib On coumadin No recent afib by PPM interrogation today Will need INR on day prior to ablation.

## 2014-02-08 ENCOUNTER — Telehealth: Payer: Self-pay | Admitting: Internal Medicine

## 2014-02-08 NOTE — Telephone Encounter (Signed)
New Message  Pt called states that private matters have come up/ Will need to cancel ablation for 02/15/2014/// He will call back whenever he can resch//SR

## 2014-02-11 NOTE — Telephone Encounter (Signed)
Canceled procedure and will follow with Dr [name]

## 2014-02-15 ENCOUNTER — Encounter (HOSPITAL_COMMUNITY): Payer: Self-pay

## 2014-02-15 ENCOUNTER — Ambulatory Visit (HOSPITAL_COMMUNITY): Admit: 2014-02-15 | Payer: Medicare Other | Admitting: Internal Medicine

## 2014-02-15 SURGERY — V-TACH ABLATION
Anesthesia: Monitor Anesthesia Care

## 2014-02-20 ENCOUNTER — Encounter: Payer: Self-pay | Admitting: Internal Medicine

## 2014-02-20 ENCOUNTER — Telehealth: Payer: Self-pay | Admitting: Internal Medicine

## 2014-02-20 NOTE — Telephone Encounter (Signed)
Pt did not do VT ablation due to family member passing away; plans to reschedule.   Pt had 9 VT episodes all pace-terminated successfully; all appropriate.   Claiborne Billings will call pt to reschedule VT ablation.

## 2014-02-20 NOTE — Telephone Encounter (Signed)
New message     Patient calling stating his pulse beating fast. Patient stated he will be doing a remote transmitting . Advise patient that would be fine - let the device clinic know.   Spoke with Juanda Crumble who's aware of remote transmitting will be coming through.

## 2014-02-21 ENCOUNTER — Encounter: Payer: Self-pay | Admitting: *Deleted

## 2014-02-21 ENCOUNTER — Other Ambulatory Visit: Payer: Self-pay | Admitting: *Deleted

## 2014-02-21 DIAGNOSIS — I472 Ventricular tachycardia, unspecified: Secondary | ICD-10-CM

## 2014-02-26 ENCOUNTER — Other Ambulatory Visit: Payer: Self-pay | Admitting: *Deleted

## 2014-02-26 ENCOUNTER — Encounter: Payer: Self-pay | Admitting: Critical Care Medicine

## 2014-02-26 ENCOUNTER — Ambulatory Visit (INDEPENDENT_AMBULATORY_CARE_PROVIDER_SITE_OTHER): Payer: Medicare Other | Admitting: Critical Care Medicine

## 2014-02-26 VITALS — BP 122/68 | HR 67 | Ht 68.0 in | Wt 199.2 lb

## 2014-02-26 DIAGNOSIS — J4489 Other specified chronic obstructive pulmonary disease: Secondary | ICD-10-CM

## 2014-02-26 DIAGNOSIS — IMO0001 Reserved for inherently not codable concepts without codable children: Secondary | ICD-10-CM

## 2014-02-26 DIAGNOSIS — J449 Chronic obstructive pulmonary disease, unspecified: Secondary | ICD-10-CM | POA: Diagnosis not present

## 2014-02-26 MED ORDER — FLUTICASONE-SALMETEROL 250-50 MCG/DOSE IN AEPB: 1.0000 | INHALATION_SPRAY | Freq: Two times a day (BID) | RESPIRATORY_TRACT | Status: DC

## 2014-02-26 NOTE — Patient Instructions (Signed)
No change in medications Return 4 months [place]

## 2014-02-26 NOTE — Progress Notes (Signed)
Subjective:    Patient ID: Cody Gomez, male    DOB: February 10, 1931, 80 y.o.   MRN: [id number]  HPI  80 y.o. WM with Copd and reversable components  02/26/2014 Chief Complaint  Patient presents with  . Follow-up    Pt c/o SOB with exertion, prod cough with white mucous.  Denies wheezing, chest tightness.    C/o dyspnea with exertion.  White mucus.  No real chest pain. No fever.  No recent ABX or steroid. Pt denies any significant sore throat, nasal congestion or excess secretions, fever, chills, sweats, unintended weight loss, pleurtic or exertional chest pain, orthopnea PND, or leg swelling Pt denies any increase in rescue therapy over baseline, denies waking up needing it or having any early am or nocturnal exacerbations of coughing/wheezing/or dyspnea. Pt also denies any obvious fluctuation in symptoms with  weather or environmental change or other alleviating or aggravating factors  Planning ablation per Dr Rayann Heman at [facility] for afib On warfarin, no falls.  ICD was firing too much.   Stays on cpap machine   Past Medical History  Diagnosis Date  . Nonischemic dilated cardiomyopathy     Catheterization 2008 demonstrated 40% LAD  . Ventricular tachycardia     Shock therapy delivered to the his ICD; previous failure to tolerate amiodarone and dronaderone  . ICD (implantable cardiac defibrillator) in place 10/29/10    Medtronic-dual-chamber  . GERD (gastroesophageal reflux disease)   . HLD (hyperlipidemia)   . HTN (hypertension)   . Atrial fibrillation     chronic coumadin   . History of prostate cancer   . Hyperthyroidism      Family History  Problem Relation Age of Onset  . Heart disease Mother   . Cancer Father     bladder      History   Social History  . Marital Status: Single    Spouse Name: N/A    Number of Children: N/A  . Years of Education: N/A   Occupational History  . Not on file.   Social History Main Topics  . Smoking status: Former Smoker -- 2.00  packs/day for 38 years    Types: Cigarettes, Pipe, Cigars    Quit date: 12/13/1982  . Smokeless tobacco: Former Systems developer    Types: [place] date: 12/14/1983     Comment: started when 14; quit in 1984, up to 2 ppd.   . Alcohol Use: No  . Drug Use: No  . Sexual Activity: Not on file   Other Topics Concern  . Not on file   Social History Narrative   Married, 1 son; retired Administrator.      Allergies  Allergen Reactions  . Dobutamine Other (See Comments)    Heart stopped  . Penicillins     REACTION: swelling     Outpatient Prescriptions Prior to Visit  Medication Sig Dispense Refill  . albuterol (PROVENTIL) (2.5 MG/3ML) 0.083% nebulizer solution Take 2.5 mg by nebulization every 6 (six) hours as needed for shortness of breath.       Marland Kitchen atorvastatin (LIPITOR) 10 MG tablet Take 1 tablet by mouth daily.      . Azelastine-Fluticasone (DYMISTA) 137-50 MCG/ACT SUSP Place 1 puff into the nose 2 (two) times daily.  7 g  1  . carvedilol (COREG) 25 MG tablet Take 1 tablet (25 mg total) by mouth 2 (two) times daily.  180 tablet  3  . dofetilide (TIKOSYN) 500 MCG capsule Take 500 mcg by  mouth 2 (two) times daily.      . furosemide (LASIX) 40 MG tablet TAKE 1 TABLET BY MOUTH DAILY  30 tablet  6  . guaiFENesin (MUCINEX) 600 MG 12 hr tablet Take 600 mg by mouth at bedtime.      . Magnesium Chloride (SLOW-MAG PO) Take 71.5 mg by mouth 2 (two) times daily.        . methimazole (TAPAZOLE) 10 MG tablet Take 10 mg by mouth daily.        Marland Kitchen mexiletine (MEXITIL) 150 MG capsule TAKE ONE CAPSULE BY MOUTH TWICE DAILY  60 capsule  7  . NITROSTAT 0.4 MG SL tablet Place 1 tablet under the tongue every 5 (five) minutes as needed for chest pain.       . potassium chloride SA (K-DUR,KLOR-CON) 20 MEQ tablet TAKE 1 TABLET BY MOUTH TWICE DAILY  60 tablet  6  . spironolactone (ALDACTONE) 25 MG tablet Take 1 tablet (25 mg total) by mouth daily.  90 tablet  3  . warfarin (COUMADIN) 5 MG tablet Take 4.5 mg by mouth  daily.       . Fluticasone-Salmeterol (ADVAIR DISKUS) 250-50 MCG/DOSE AEPB Inhale 1 puff into the lungs 2 (two) times daily.  14 each  6   No facility-administered medications prior to visit.      Review of Systems  Constitutional:   No  weight loss, night sweats,  Fevers, chills, fatigue, lassitude. HEENT:   No headaches,  Difficulty swallowing,  Tooth/dental problems,  Sore throat,                No sneezing, itching, ear ache, nasal congestion, post nasal drip,   CV:  No chest pain,  Orthopnea, PND, swelling in lower extremities, anasarca, dizziness, palpitations  GI  No heartburn, indigestion, abdominal pain, nausea, vomiting, diarrhea, change in bowel habits, loss of appetite  Resp: Notes  shortness of breath with exertion not at rest.  Notes excess mucus, notes productive cough,  No non-productive cough,  No coughing up of blood.  No change in color of mucus.  No wheezing.  No chest wall deformity  Skin: no rash or lesions.  GU: no dysuria, change in color of urine, no urgency or frequency.  No flank pain.  MS:  No joint pain or swelling.  No decreased range of motion.  No back pain.  Psych:  No change in mood or affect. No depression or anxiety.  No memory loss.     Objective:   Physical Exam  Filed Vitals:   02/26/14 1411  BP: 122/68  Pulse: 67  Height: 5\' 8"  (1.727 m)  Weight: 90.357 kg (199 lb 3.2 oz)  SpO2: 95%    Gen: Pleasant, well-nourished, in no distress,  normal affect  ENT: No lesions,  mouth clear,  oropharynx clear, no postnasal drip  Neck: No JVD, no TMG, no carotid bruits  Lungs: No use of accessory muscles, no dullness to percussion, distant BS,  Cardiovascular: RRR, heart sounds normal, no murmur or gallops, no peripheral edema  Abdomen: soft and NT, no HSM,  BS normal  Musculoskeletal: No deformities, no cyanosis or clubbing  Neuro: alert, non focal  Skin: Warm, no lesions or rashes       Assessment & Plan:   COPD with  bronchitis 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 Return in  4 mo     Updated Medication List  Outpatient Encounter Prescriptions as of 02/26/2014  Medication Sig  . albuterol (PROVENTIL) (2.5 MG/3ML) 0.083% nebulizer solution Take 2.5 mg by nebulization every 6 (six) hours as needed for shortness of breath.   Marland Kitchen atorvastatin (LIPITOR) 10 MG tablet Take 1 tablet by mouth daily.  . Azelastine-Fluticasone (DYMISTA) 137-50 MCG/ACT SUSP Place 1 puff into the nose 2 (two) times daily.  . carvedilol (COREG) 25 MG tablet Take 1 tablet (25 mg total) by mouth 2 (two) times daily.  Marland Kitchen dofetilide (TIKOSYN) 500 MCG capsule Take 500 mcg by mouth 2 (two) times daily.  . Fluticasone-Salmeterol (ADVAIR DISKUS) 250-50 MCG/DOSE AEPB Inhale 1 puff into the lungs 2 (two) times daily.  . furosemide (LASIX) 40 MG tablet TAKE 1 TABLET BY MOUTH DAILY  . guaiFENesin (MUCINEX) 600 MG 12 hr tablet Take 600 mg by mouth at bedtime.  . Magnesium Chloride (SLOW-MAG PO) Take 71.5 mg by mouth 2 (two) times daily.    . methimazole (TAPAZOLE) 10 MG tablet Take 10 mg by mouth daily.    Marland Kitchen mexiletine (MEXITIL) 150 MG capsule TAKE ONE CAPSULE BY MOUTH TWICE DAILY  . NITROSTAT 0.4 MG SL tablet Place 1 tablet under the tongue every 5 (five) minutes as needed for chest pain.   . potassium chloride SA (K-DUR,KLOR-CON) 20 MEQ tablet TAKE 1 TABLET BY MOUTH TWICE DAILY  . spironolactone (ALDACTONE) 25 MG tablet Take 1 tablet (25 mg total) by mouth daily.  Marland Kitchen warfarin (COUMADIN) 5 MG tablet Take 4.5 mg by mouth daily.   . [DISCONTINUED] Fluticasone-Salmeterol (ADVAIR DISKUS) 250-50 MCG/DOSE AEPB Inhale 1 puff into the lungs 2 (two) times daily.

## 2014-02-27 NOTE — Assessment & Plan Note (Signed)
Gold B copd with chronic bronchitis stable at present Plan No change in inhaled or maintenance medications. Return in  4 mo

## 2014-03-11 ENCOUNTER — Encounter (HOSPITAL_COMMUNITY): Payer: Self-pay | Admitting: Pharmacy Technician

## 2014-03-19 ENCOUNTER — Other Ambulatory Visit (INDEPENDENT_AMBULATORY_CARE_PROVIDER_SITE_OTHER): Payer: Medicare Other | Admitting: *Deleted

## 2014-03-19 DIAGNOSIS — I472 Ventricular tachycardia, unspecified: Secondary | ICD-10-CM

## 2014-03-19 DIAGNOSIS — I4729 Other ventricular tachycardia: Secondary | ICD-10-CM | POA: Diagnosis not present

## 2014-03-19 LAB — BASIC METABOLIC PANEL
BUN: 19 mg/dL (ref 6–23)
CHLORIDE: 106 meq/L (ref 96–112)
CO2: 26 mEq/L (ref 19–32)
CREATININE: 1 mg/dL (ref 0.4–1.5)
Calcium: 9.4 mg/dL (ref 8.4–10.5)
GFR: 76.25 mL/min (ref >60.00)
Glucose, Bld: 77 mg/dL (ref 70–99)
POTASSIUM: 4.8 meq/L (ref 3.5–5.1)
Sodium: 137 mEq/L (ref 135–145)

## 2014-03-19 LAB — CBC WITH DIFFERENTIAL/PLATELET
BASOS PCT: 0.7 % (ref 0.0–3.0)
Basophils Absolute: 0 10*3/uL (ref 0.0–0.1)
Eosinophils Absolute: 0.6 10*3/uL (ref 0.0–0.7)
Eosinophils Relative: 10.1 % — ABNORMAL HIGH (ref 0.0–5.0)
HEMATOCRIT: 41.2 % (ref 39.0–52.0)
HEMOGLOBIN: 13.7 g/dL (ref 13.0–17.0)
Lymphocytes Relative: 27.6 % (ref 12.0–46.0)
Lymphs Abs: 1.5 10*3/uL (ref 0.7–4.0)
MCHC: 33.3 g/dL (ref 30.0–36.0)
MCV: 92 fl (ref 78.0–100.0)
MONO ABS: 0.5 10*3/uL (ref 0.1–1.0)
Monocytes Relative: 9.8 % (ref 3.0–12.0)
NEUTROS ABS: 2.8 10*3/uL (ref 1.4–7.7)
Neutrophils Relative %: 51.8 % (ref 43.0–77.0)
Platelets: 253 10*3/uL (ref 150.0–400.0)
RBC: 4.48 Mil/uL (ref 4.22–5.81)
RDW: 14.6 % (ref 11.5–14.6)
WBC: 5.5 10*3/uL (ref 4.5–10.5)

## 2014-03-25 DIAGNOSIS — Z7901 Long term (current) use of anticoagulants: Secondary | ICD-10-CM | POA: Diagnosis not present

## 2014-03-26 ENCOUNTER — Encounter (HOSPITAL_COMMUNITY): Payer: Self-pay | Admitting: Anesthesiology

## 2014-03-26 ENCOUNTER — Encounter (HOSPITAL_COMMUNITY): Payer: Medicare Other | Admitting: Anesthesiology

## 2014-03-26 ENCOUNTER — Encounter (HOSPITAL_COMMUNITY)
Admission: RE | Disposition: A | Discharge status: Home / Self Care | Payer: Self-pay | Source: Ambulatory Visit | Attending: Internal Medicine

## 2014-03-26 ENCOUNTER — Ambulatory Visit (HOSPITAL_COMMUNITY)
Admission: RE | Admit: 2014-03-26 | Discharge: 2014-03-27 | Disposition: A | Discharge status: Home / Self Care | Payer: Medicare Other | Source: Ambulatory Visit | Attending: Internal Medicine | Admitting: Internal Medicine

## 2014-03-26 ENCOUNTER — Ambulatory Visit (HOSPITAL_COMMUNITY): Payer: Medicare Other | Admitting: Anesthesiology

## 2014-03-26 DIAGNOSIS — I10 Essential (primary) hypertension: Secondary | ICD-10-CM | POA: Diagnosis not present

## 2014-03-26 DIAGNOSIS — E785 Hyperlipidemia, unspecified: Secondary | ICD-10-CM | POA: Insufficient documentation

## 2014-03-26 DIAGNOSIS — I471 Supraventricular tachycardia: Secondary | ICD-10-CM | POA: Diagnosis not present

## 2014-03-26 DIAGNOSIS — G4730 Sleep apnea, unspecified: Secondary | ICD-10-CM | POA: Diagnosis not present

## 2014-03-26 DIAGNOSIS — Z7901 Long term (current) use of anticoagulants: Secondary | ICD-10-CM | POA: Diagnosis not present

## 2014-03-26 DIAGNOSIS — Z8546 Personal history of malignant neoplasm of prostate: Secondary | ICD-10-CM | POA: Diagnosis not present

## 2014-03-26 DIAGNOSIS — I428 Other cardiomyopathies: Secondary | ICD-10-CM | POA: Insufficient documentation

## 2014-03-26 DIAGNOSIS — G4733 Obstructive sleep apnea (adult) (pediatric): Secondary | ICD-10-CM

## 2014-03-26 DIAGNOSIS — Z4502 Encounter for adjustment and management of automatic implantable cardiac defibrillator: Secondary | ICD-10-CM

## 2014-03-26 DIAGNOSIS — I472 Ventricular tachycardia, unspecified: Secondary | ICD-10-CM | POA: Insufficient documentation

## 2014-03-26 DIAGNOSIS — I4729 Other ventricular tachycardia: Secondary | ICD-10-CM | POA: Diagnosis not present

## 2014-03-26 DIAGNOSIS — Z87891 Personal history of nicotine dependence: Secondary | ICD-10-CM | POA: Diagnosis not present

## 2014-03-26 DIAGNOSIS — J45909 Unspecified asthma, uncomplicated: Secondary | ICD-10-CM | POA: Insufficient documentation

## 2014-03-26 DIAGNOSIS — K222 Esophageal obstruction: Secondary | ICD-10-CM

## 2014-03-26 DIAGNOSIS — Z95810 Presence of automatic (implantable) cardiac defibrillator: Secondary | ICD-10-CM

## 2014-03-26 DIAGNOSIS — I509 Heart failure, unspecified: Secondary | ICD-10-CM | POA: Diagnosis not present

## 2014-03-26 DIAGNOSIS — E0590 Thyrotoxicosis, unspecified without thyrotoxic crisis or storm: Secondary | ICD-10-CM

## 2014-03-26 DIAGNOSIS — I5022 Chronic systolic (congestive) heart failure: Secondary | ICD-10-CM | POA: Insufficient documentation

## 2014-03-26 DIAGNOSIS — C61 Malignant neoplasm of prostate: Secondary | ICD-10-CM | POA: Diagnosis not present

## 2014-03-26 DIAGNOSIS — I4891 Unspecified atrial fibrillation: Secondary | ICD-10-CM | POA: Insufficient documentation

## 2014-03-26 DIAGNOSIS — K219 Gastro-esophageal reflux disease without esophagitis: Secondary | ICD-10-CM | POA: Diagnosis not present

## 2014-03-26 DIAGNOSIS — IMO0001 Reserved for inherently not codable concepts without codable children: Secondary | ICD-10-CM

## 2014-03-26 HISTORY — DX: Unspecified malignant neoplasm of skin, unspecified: C44.90

## 2014-03-26 HISTORY — DX: Presence of automatic (implantable) cardiac defibrillator: Z95.810

## 2014-03-26 HISTORY — DX: Malignant neoplasm of prostate: C61

## 2014-03-26 HISTORY — PX: V-TACH ABLATION: SHX5498

## 2014-03-26 HISTORY — DX: Unspecified asthma, uncomplicated: J45.909

## 2014-03-26 HISTORY — PX: ABLATION: SHX5711

## 2014-03-26 HISTORY — DX: Obstructive sleep apnea (adult) (pediatric): G47.33

## 2014-03-26 HISTORY — DX: Dependence on other enabling machines and devices: Z99.89

## 2014-03-26 HISTORY — DX: Other chronic pain: G89.29

## 2014-03-26 HISTORY — DX: Low back pain, unspecified: M54.50

## 2014-03-26 HISTORY — DX: Low back pain: M54.5

## 2014-03-26 LAB — PROTIME-INR
INR: 2.39 — ABNORMAL HIGH (ref 0.00–1.49)
PROTHROMBIN TIME: 25.3 s — AB (ref 11.6–15.2)

## 2014-03-26 LAB — POCT ACTIVATED CLOTTING TIME: ACTIVATED CLOTTING TIME: 177 s

## 2014-03-26 SURGERY — V-TACH ABLATION
Anesthesia: General

## 2014-03-26 MED ORDER — HYDROCODONE-ACETAMINOPHEN 5-325 MG PO TABS
ORAL_TABLET | ORAL | Status: AC
  Filled 2014-03-26: qty 2

## 2014-03-26 MED ORDER — SODIUM CHLORIDE 0.9 % IJ SOLN: 3.0000 mL | INTRAMUSCULAR | Status: DC | PRN

## 2014-03-26 MED ORDER — FENTANYL CITRATE 0.05 MG/ML IJ SOLN
INTRAMUSCULAR | Status: DC | PRN
  Administered 2014-03-26: 50 ug via INTRAVENOUS

## 2014-03-26 MED ORDER — PHENYLEPHRINE HCL 10 MG/ML IJ SOLN
INTRAMUSCULAR | Status: DC | PRN
  Administered 2014-03-26 (×9): 80 ug via INTRAVENOUS

## 2014-03-26 MED ORDER — METHIMAZOLE 10 MG PO TABS
10.0000 mg | ORAL_TABLET | Freq: Every day | ORAL | Status: DC
  Administered 2014-03-27: 10 mg via ORAL
  Filled 2014-03-26 (×2): qty 1

## 2014-03-26 MED ORDER — SODIUM CHLORIDE 0.9 % IV SOLN: 250.0000 mL | INTRAVENOUS | Status: DC | PRN

## 2014-03-26 MED ORDER — HYDROCODONE-ACETAMINOPHEN 5-325 MG PO TABS
1.0000 | ORAL_TABLET | ORAL | Status: DC | PRN
  Administered 2014-03-26: 2 via ORAL

## 2014-03-26 MED ORDER — ALBUTEROL SULFATE (2.5 MG/3ML) 0.083% IN NEBU: 2.5000 mg | INHALATION_SOLUTION | Freq: Four times a day (QID) | RESPIRATORY_TRACT | Status: DC | PRN

## 2014-03-26 MED ORDER — HEPARIN SODIUM (PORCINE) 1000 UNIT/ML IJ SOLN
INTRAMUSCULAR | Status: DC | PRN
  Administered 2014-03-26: 3000 [IU] via INTRAVENOUS
  Administered 2014-03-26: 4000 [IU] via INTRAVENOUS
  Administered 2014-03-26: 3000 [IU] via INTRAVENOUS

## 2014-03-26 MED ORDER — FENTANYL CITRATE 0.05 MG/ML IJ SOLN: 25.0000 ug | INTRAMUSCULAR | Status: DC | PRN

## 2014-03-26 MED ORDER — MENTHOL 3 MG MT LOZG
1.0000 | LOZENGE | OROMUCOSAL | Status: DC | PRN
  Filled 2014-03-26 (×3): qty 9

## 2014-03-26 MED ORDER — DOFETILIDE 500 MCG PO CAPS
500.0000 ug | ORAL_CAPSULE | Freq: Two times a day (BID) | ORAL | Status: DC
  Administered 2014-03-26 – 2014-03-27 (×2): 500 ug via ORAL
  Filled 2014-03-26 (×3): qty 1

## 2014-03-26 MED ORDER — CARVEDILOL 25 MG PO TABS
25.0000 mg | ORAL_TABLET | Freq: Two times a day (BID) | ORAL | Status: DC
  Administered 2014-03-26 – 2014-03-27 (×2): 25 mg via ORAL
  Filled 2014-03-26 (×4): qty 1

## 2014-03-26 MED ORDER — SODIUM CHLORIDE 0.9 % IV SOLN
INTRAVENOUS | Status: DC | PRN
  Administered 2014-03-26 (×2): via INTRAVENOUS

## 2014-03-26 MED ORDER — MEXILETINE HCL 150 MG PO CAPS
150.0000 mg | ORAL_CAPSULE | Freq: Three times a day (TID) | ORAL | Status: DC
  Administered 2014-03-26 – 2014-03-27 (×2): 150 mg via ORAL
  Filled 2014-03-26 (×6): qty 1

## 2014-03-26 MED ORDER — ONDANSETRON HCL 4 MG/2ML IJ SOLN
INTRAMUSCULAR | Status: DC | PRN
  Administered 2014-03-26: 4 mg via INTRAVENOUS

## 2014-03-26 MED ORDER — PROPOFOL 10 MG/ML IV BOLUS
INTRAVENOUS | Status: DC | PRN
  Administered 2014-03-26: 50 mg via INTRAVENOUS
  Administered 2014-03-26: 150 mg via INTRAVENOUS

## 2014-03-26 MED ORDER — WARFARIN SODIUM 4 MG PO TABS
4.0000 mg | ORAL_TABLET | Freq: Every day | ORAL | Status: DC
  Administered 2014-03-26: 4 mg via ORAL
  Filled 2014-03-26 (×2): qty 1

## 2014-03-26 MED ORDER — WARFARIN - PHYSICIAN DOSING INPATIENT: Freq: Every day | Status: DC

## 2014-03-26 MED ORDER — SPIRONOLACTONE 25 MG PO TABS
25.0000 mg | ORAL_TABLET | Freq: Every day | ORAL | Status: DC
  Administered 2014-03-27: 25 mg via ORAL
  Filled 2014-03-26 (×2): qty 1

## 2014-03-26 MED ORDER — HEPARIN SODIUM (PORCINE) 1000 UNIT/ML IJ SOLN
INTRAMUSCULAR | Status: AC
  Filled 2014-03-26: qty 1

## 2014-03-26 MED ORDER — SODIUM CHLORIDE 0.9 % IJ SOLN
3.0000 mL | Freq: Two times a day (BID) | INTRAMUSCULAR | Status: DC
  Administered 2014-03-26: 3 mL via INTRAVENOUS

## 2014-03-26 MED ORDER — LIDOCAINE HCL (CARDIAC) 20 MG/ML IV SOLN
INTRAVENOUS | Status: DC | PRN
  Administered 2014-03-26: 100 mg via INTRAVENOUS

## 2014-03-26 MED ORDER — PROTAMINE SULFATE 10 MG/ML IV SOLN
INTRAVENOUS | Status: DC | PRN
  Administered 2014-03-26: 20 mg via INTRAVENOUS
  Administered 2014-03-26: 10 mg via INTRAVENOUS

## 2014-03-26 MED ORDER — BUPIVACAINE HCL (PF) 0.25 % IJ SOLN
INTRAMUSCULAR | Status: AC
  Filled 2014-03-26: qty 30

## 2014-03-26 NOTE — Anesthesia Procedure Notes (Signed)
Procedure Name: LMA Insertion Date/Time: 03/26/2014 8:10 AM Performed by: Luciana Axe K Pre-anesthesia Checklist: Patient identified, Emergency Drugs available, Suction available, Patient being monitored and Timeout performed Patient Re-evaluated:Patient Re-evaluated prior to inductionOxygen Delivery Method: Circle system utilized Preoxygenation: Pre-oxygenation with 100% oxygen Intubation Type: IV induction LMA: LMA with gastric port inserted LMA Size: 5.0 Number of attempts: 1 Placement Confirmation: positive ETCO2,  CO2 detector and breath sounds checked- equal and bilateral Tube secured with: Tape Dental Injury: Teeth and Oropharynx as per pre-operative assessment

## 2014-03-26 NOTE — Progress Notes (Signed)
UR Completed Sibel Khurana Graves-Bigelow, RN,BSN [phone]  

## 2014-03-26 NOTE — Transfer of Care (Signed)
Immediate Anesthesia Transfer of Care Note  Patient: Cody Gomez  Procedure(s) Performed: Procedure(s): V-TACH ABLATION (N/A)  Patient Location: Cath Lab  Anesthesia Type:General  Level of Consciousness: awake, alert , oriented and patient cooperative  Airway & Oxygen Therapy: Patient Spontanous Breathing and Patient connected to nasal cannula oxygen  Post-op Assessment: Report given to PACU RN and Post -op Vital signs reviewed and stable  Post vital signs: Reviewed  Complications: No apparent anesthesia complications

## 2014-03-26 NOTE — Anesthesia Preprocedure Evaluation (Addendum)
Anesthesia Evaluation  Patient identified by MRN, date of birth, ID band Patient awake    Reviewed: Allergy & Precautions, H&P , NPO status , Patient's Chart, lab work & pertinent test results, reviewed documented beta blocker date and time   History of Anesthesia Complications Negative for: history of anesthetic complications  Airway Mallampati: III TM Distance: >3 FB Neck ROM: Limited    Dental  (+) Edentulous Upper, Edentulous Lower, Dental Advisory Given   Pulmonary asthma , sleep apnea and Continuous Positive Airway Pressure Ventilation , COPDformer smoker,  breath sounds clear to auscultation        Cardiovascular hypertension, Pt. on medications +CHF + Cardiac Defibrillator - Valvular Problems/MurmursRhythm:Regular Rate:Normal  Left ventricle: The cavity size was normal. Wall thickness   was increased in a pattern of mild LVH. Systolic function   was normal. The estimated ejection fraction was in the   range of 50% to 55%. Wall motion was normal; there were no   regional wall motion abnormalities. Doppler parameters are   consistent with abnormal left ventricular relaxation   (grade 1 diastolic dysfunction). - Ventricular septum: Septal motion showed abnormal function   and dyssynergy. - Aortic valve: Trivial regurgitation. - Mitral valve: Mild regurgitation. - Tricuspid valve: Moderate regurgitation.    Neuro/Psych negative neurological ROS     GI/Hepatic Neg liver ROS, GERD-  ,  Endo/Other  Hyperthyroidism   Renal/GU negative Renal ROS     Musculoskeletal   Abdominal   Peds  Hematology negative hematology ROS (+)   Anesthesia Other Findings   Reproductive/Obstetrics negative OB ROS                        Anesthesia Physical Anesthesia Plan  ASA: III  Anesthesia Plan: General   Post-op Pain Management:    Induction: Intravenous  Airway Management Planned: LMA  Additional  Equipment:   Intra-op Plan:   Post-operative Plan: Extubation in OR  Informed Consent: I have reviewed the patients History and Physical, chart, labs and discussed the procedure including the risks, benefits and alternatives for the proposed anesthesia with the patient or authorized representative who has indicated his/her understanding and acceptance.   Dental advisory given  Plan Discussed with: CRNA, Anesthesiologist and Surgeon  Anesthesia Plan Comments:         Anesthesia Quick Evaluation

## 2014-03-26 NOTE — H&P (Signed)
PCP: Carlus Pavlov, MD  Primary Cardiologist/EP- Dr [name]  The patient presents today for VT ablation. He has had difficulty with sustained VT for which he has received ICD therapy.  He has failed medical therapy with amiodarone and multaq. Presently, he is on tikosyn, mexiletine, and ranexa. Despite this therapy, he has ongoing VT. Device interrogation revealed VT at 330-350 msec. Since last being seen in our clinic, the patient reports doing very well. Today, he denies symptoms of palpitations, chest pain, shortness of breath, orthopnea, PND, lower extremity edema, dizziness, presyncope, syncope, or neurologic sequela. The patient feels that he is tolerating medications without difficulties and is otherwise without complaint today.   Past Medical History   Diagnosis  Date   .  Nonischemic dilated cardiomyopathy      Catheterization 2008 demonstrated 40% LAD   .  Ventricular tachycardia      Shock therapy delivered to the his ICD; previous failure to tolerate amiodarone and dronaderone   .  ICD (implantable cardiac defibrillator) in place  10/29/10     Medtronic-dual-chamber   .  GERD (gastroesophageal reflux disease)    .  HLD (hyperlipidemia)    .  HTN (hypertension)    .  Atrial fibrillation      chronic coumadin   .  History of prostate cancer    .  Hyperthyroidism     Past Surgical History   Procedure  Laterality  Date   .  Prostatectomy     .  Cardiac defibrillator placement   07/18/2007     MDT ICD implanted with a [id number] [id number] RV leald   .  Appendectomy     .  Hernia repair     .  Implantable cardioverter defibrillator generator change   06/22/13     medtronic Evera XT DR generator change peformed by Dr [name]    Current Outpatient Prescriptions   Medication  Sig  Dispense  Refill   .  albuterol (PROVENTIL) (2.5 MG/3ML) 0.083% nebulizer solution  Take 2.5 mg by nebulization every 6 (six) hours as needed for shortness of breath.     Marland Kitchen  atorvastatin (LIPITOR) 10 MG tablet  Take 1  tablet by mouth daily.     .  Azelastine-Fluticasone (DYMISTA) 137-50 MCG/ACT SUSP  Place 1 puff into the nose 2 (two) times daily.  7 g  1   .  carvedilol (COREG) 25 MG tablet  Take 1 tablet (25 mg total) by mouth 2 (two) times daily.  180 tablet  3   .  dofetilide (TIKOSYN) 500 MCG capsule  Take 500 mcg by mouth 2 (two) times daily.     .  Fluticasone-Salmeterol (ADVAIR DISKUS) 250-50 MCG/DOSE AEPB  Inhale 1 puff into the lungs 2 (two) times daily.  14 each  6   .  furosemide (LASIX) 40 MG tablet  TAKE 1 TABLET BY MOUTH DAILY  30 tablet  6   .  guaiFENesin (MUCINEX) 600 MG 12 hr tablet  Take 600 mg by mouth at bedtime.     .  Magnesium Chloride (SLOW-MAG PO)  Take 71.5 mg by mouth 2 (two) times daily.     .  methimazole (TAPAZOLE) 10 MG tablet  Take 10 mg by mouth daily.     Marland Kitchen  mexiletine (MEXITIL) 150 MG capsule  TAKE ONE CAPSULE BY MOUTH TWICE DAILY  60 capsule  7   .  NITROSTAT 0.4 MG SL tablet  Place 1 tablet under the tongue every 5 (five)  minutes as needed for chest pain.     .  potassium chloride SA (K-DUR,KLOR-CON) 20 MEQ tablet  TAKE 1 TABLET BY MOUTH TWICE DAILY  60 tablet  6   .  spironolactone (ALDACTONE) 25 MG tablet  Take 1 tablet (25 mg total) by mouth daily.  90 tablet  3   .  warfarin (COUMADIN) 5 MG tablet  Take 5 mg by mouth daily.      No current facility-administered medications for this visit.    Allergies   Allergen  Reactions   .  Dobutamine  Other (See Comments)     Heart stopped   .  Penicillins      REACTION: swelling    History    Social History   .  Marital Status:  Single     Spouse Name:  N/A     Number of Children:  N/A   .  Years of Education:  N/A    Occupational History   .  Not on file.    Social History Main Topics   .  Smoking status:  Former Smoker -- 2.00 packs/day for 38 years     Types:  Cigarettes, Pipe, Cigars     Quit date:  12/13/1982   .  Smokeless tobacco:  Former Systems developer     Types:  [place] date:  12/14/1983      Comment:  started when 14; quit in 1984, up to 2 ppd.   .  Alcohol Use:  No   .  Drug Use:  No   .  Sexual Activity:  Not on file    Other Topics  Concern   .  Not on file    Social History Narrative    Married, 1 son; retired Administrator.    Family History   Problem  Relation  Age of Onset   .  Heart disease  Mother    .  Cancer  Father      bladder   ROS- All systems are reviewed and are negative except as outlined in the HPI above  Physical Exam:  Filed Vitals:  Filed Vitals:   03/26/14 0553  BP: 124/70  Pulse: 76  Temp: 98.3 F (36.8 C)  Resp: 18     GEN- The patient is elderly appearing, alert and oriented x 3 today.  Head- normocephalic, atraumatic  Eyes- Sclera clear, conjunctiva pink  Ears- hearing intact  Oropharynx- clear  Neck- supple,  Lungs- Clear to ausculation bilaterally, normal work of breathing  Heart- Regular rate and rhythm, no murmurs, rubs or gallops, PMI not laterally displaced  GI- soft, NT, ND, + BS  Extremities- no clubbing, cyanosis, or edema   Assessment and Plan:  1. VT  The patient has recurrent nonischemic VT with CL 350 msec. He has failed medical therapy with multiple AADs.  Risk, benefits, and alternatives to EP study and radiofrequency ablation were discussed in detail today. These risks include but are not limited to stroke, bleeding, vascular damage, tamponade, perforation, damage to the heart and other structures, AV block, ICD lead dislodgement, worsening renal function, and death. The patient understands these risk and wishes to proceed. We will therefore proceed with catheter ablation at this time.

## 2014-03-26 NOTE — Progress Notes (Signed)
PHARMACIST - PHYSICIAN COMMUNICATION DR:  Allred CONCERNING: Pharmacy Care Issues Regarding Warfarin Labs  RECOMMENDATION (Action Taken): A daily protime for three days has been ordered to meet the [facility] National Patient safety goal and comply with the current [place].   The Pharmacy will defer all warfarin dose order changes and follow up of lab results to the prescriber unless an additional order to initiate a "pharmacy Coumadin consult" is placed.  DESCRIPTION:  While hospitalized, to be in compliance with [facility] Patient Safety Goals, all patients on warfarin must have a current protime prior to the administration of warfarin. Pharmacy has received your order for warfarin without these required laboratory assessments.   Kelvin Cellar, RPh Pager: [phone] 03/26/2014 3:49 PM

## 2014-03-26 NOTE — Anesthesia Postprocedure Evaluation (Signed)
  Anesthesia Post-op Note  Patient: Cody Gomez  Procedure(s) Performed: Procedure(s): V-TACH ABLATION (N/A)  Patient Location: PACU  Anesthesia Type:General  Level of Consciousness: awake and alert   Airway and Oxygen Therapy: Patient Spontanous Breathing  Post-op Pain: none  Post-op Assessment: Post-op Vital signs reviewed, Patient's Cardiovascular Status Stable, Respiratory Function Stable, Patent Airway, No signs of Nausea or vomiting and Pain level controlled  Post-op Vital Signs: Reviewed and stable  Last Vitals:  Filed Vitals:   03/26/14 1430  BP: 87/52  Pulse: 70  Temp:   Resp:     Complications: No apparent anesthesia complications

## 2014-03-26 NOTE — Brief Op Note (Signed)
VT ablation performed No early apparent complications

## 2014-03-26 NOTE — Progress Notes (Signed)
RT went in to speak with patient in regards to wearing CPAP and the patient states that he will not wear the hospital provided if he can't have nasal pillows.  Advised that the nasal pillows aren't available but a nasal mask could be provided.  The patient states that he is here for overnight and if has to stay longer will think about wearing the CPAP at that time. RT will continue to monitor.

## 2014-03-27 ENCOUNTER — Encounter (HOSPITAL_COMMUNITY): Payer: Self-pay | Admitting: *Deleted

## 2014-03-27 DIAGNOSIS — I4891 Unspecified atrial fibrillation: Secondary | ICD-10-CM | POA: Diagnosis not present

## 2014-03-27 DIAGNOSIS — I472 Ventricular tachycardia: Secondary | ICD-10-CM | POA: Diagnosis not present

## 2014-03-27 DIAGNOSIS — I509 Heart failure, unspecified: Secondary | ICD-10-CM | POA: Diagnosis not present

## 2014-03-27 DIAGNOSIS — I5022 Chronic systolic (congestive) heart failure: Secondary | ICD-10-CM | POA: Diagnosis not present

## 2014-03-27 DIAGNOSIS — I428 Other cardiomyopathies: Secondary | ICD-10-CM | POA: Diagnosis not present

## 2014-03-27 DIAGNOSIS — I4729 Other ventricular tachycardia: Secondary | ICD-10-CM | POA: Diagnosis not present

## 2014-03-27 LAB — PROTIME-INR
INR: 2.27 — AB (ref 0.00–1.49)
PROTHROMBIN TIME: 24.3 s — AB (ref 11.6–15.2)

## 2014-03-27 NOTE — Discharge Instructions (Signed)
No driving for 3 days. No lifting over 5 lbs for 1 week. No sexual activity for 1 week. Keep procedure site clean & dry. If you notice increased pain, swelling, bleeding or pus, call/return! You may shower, but no soaking baths/hot tubs/pools for 1 week. ° °

## 2014-03-27 NOTE — Discharge Summary (Signed)
ELECTROPHYSIOLOGY DISCHARGE SUMMARY   Patient ID: Cody Gomez,  MRN: [id number], DOB/AGE: 1931-03-18 80 y.o.  Admit date: 03/26/2014 Discharge date: 03/27/2014  Primary Care Physician: Carlus Pavlov, MD Primary EP: Jolyn Nap, MD  Primary Discharge Diagnosis:  1. Ventricular tachycardia - with 4 different VTs mapped during EPS; 3 of 4 were successfully ablated  Secondary Discharge Diagnoses:  1. NICM s/p ICD implantation previously 2. Chronic systolic HF 3. Atrial fibrillation 4. HTN 5. Dyslipidemia 6. GERD 7. Hyperthyroidism  Procedures This Admission:  PROCEDURES:  1. Comprehensive EP study.  2. Coronary sinus pacing and recording.  3. Three-dimensional mapping of ventricular tachycardia.  4. Radiofrequency ablation of ventricular tachycardia.  5. Left ventricular pacing and recording.  6. Arrhythmia induction with pacing following ablation.  7. Dual-chamber ICD interrogation and reprogramming. CONCLUSIONS:  1. Atrial pacing upon presentation.  2. No evidence of accessory pathways or inducible supraventricular  tachycardias today.  3. The patient had easily inducible and incessant ventricular  tachycardia. This was a nonischemic tachycardia.  Voltage mapping revealed no scar within the left ventricle. Four  separate ventricular tachycardias were mapped and 3 of the 4 were  successfully ablated.  4. No early apparent complications.  History and Hospital Course:  Cody Gomez is a pleasant 80 year old gentleman with a nonischemic cardiomyopathy. He is status post prior ICD implantation by Dr. [name]. He has developed recurrent symptomatic ventricular tachycardia for which he has received multiple rounds of therapy from his defibrillator. He has predominantly received ATP therapy though he has received prior ICD shocks. His tachycardia cycle length was 350 milliseconds. He has failed medical therapy with multiple antiarrhythmic drugs. He therefore presented yesterday for EP  study and radiofrequency ablation. Please see details as outlined above and in full dictated operative note. Cody Gomez tolerated this procedure well without any immediate complication. He remains hemodynamically stable and afebrile. His groin site is intact without significant bleeding or hematoma. He has been monitored on telemetry which reveals SR, no arrhythmias. He has been given discharge instructions including wound care and activity restrictions. He will follow-up in clinic in 4-6 weeks. He has been seen, examined and deemed stable for discharge today by Dr. Thompson Grayer.  Physical Exam: Vitals: Blood pressure 102/50, pulse 71, temperature 98.8 F (37.1 C), temperature source Oral, resp. rate 16, height 5\' 7"  (1.702 m), weight 192 lb (87.091 kg), SpO2 96.00%.  General: Well developed, well appearing 80 year old male in no acute distress.  Neck: Supple. JVD not elevated.  Lungs: Clear bilaterally to auscultation without wheezes, rales, or rhonchi. Breathing is unlabored.  Heart: Regular S1 S2 with II/VI systolic murmur. No rub or gallop.  Abdomen: Soft, non-distended.  Extremities: No clubbing or cyanosis. No edema. Distal pedal pulses are 2+ and equal bilaterally. Right groin site intact without bleeding or hematoma.  Neuro: Alert and oriented X 3. Moves all extremities spontaneously. No focal deficits.  Labs: Lab Results  Component Value Date   WBC 5.5 03/19/2014   HGB 13.7 03/19/2014   HCT 41.2 03/19/2014   MCV 92.0 03/19/2014   PLT 253.0 03/19/2014       Component Value Date/Time   NA 137 03/19/2014 1118   K 4.8 03/19/2014 1118   CL 106 03/19/2014 1118   CO2 26 03/19/2014 1118   GLUCOSE 77 03/19/2014 1118   BUN 19 03/19/2014 1118   CREATININE 1.0 03/19/2014 1118   CREATININE 0.73 07/05/2011 1018   CALCIUM 9.4 03/19/2014 1118   GFRNONAA >60 07/08/2011  [place] 07/08/2011 1120    Recent Labs  03/27/14 0550  INR 2.27*    Disposition:  The patient is being discharged in stable  condition.  Follow-up:     Follow-up Information   Follow up with Thompson Grayer, MD On 05/02/2014. (At 11:45 AM)    Specialty:  Cardiology   Contact information:   [place] [address] [phone]      Discharge Medications:    Medication List         albuterol (2.5 MG/3ML) 0.083% nebulizer solution  Commonly known as:  PROVENTIL  Take 2.5 mg by nebulization every 6 (six) hours as needed for shortness of breath.     atorvastatin 10 MG tablet  Commonly known as:  LIPITOR  Take 1 tablet by mouth daily.     carvedilol 25 MG tablet  Commonly known as:  COREG  Take 1 tablet (25 mg total) by mouth 2 (two) times daily.     dofetilide 500 MCG capsule  Commonly known as:  TIKOSYN  Take 500 mcg by mouth 2 (two) times daily.     Fluticasone-Salmeterol 250-50 MCG/DOSE Aepb  Commonly known as:  ADVAIR DISKUS  Inhale 1 puff into the lungs 2 (two) times daily.     furosemide 40 MG tablet  Commonly known as:  LASIX  TAKE 1 TABLET BY MOUTH DAILY     guaiFENesin 600 MG 12 hr tablet  Commonly known as:  MUCINEX  Take 600 mg by mouth at bedtime.     methimazole 10 MG tablet  Commonly known as:  TAPAZOLE  Take 10 mg by mouth daily.     mexiletine 150 MG capsule  Commonly known as:  MEXITIL  TAKE ONE CAPSULE BY MOUTH TWICE DAILY     NITROSTAT 0.4 MG SL tablet  Generic drug:  nitroGLYCERIN  Place 1 tablet under the tongue every 5 (five) minutes as needed for chest pain.     potassium chloride SA 20 MEQ tablet  Commonly known as:  K-DUR,KLOR-CON  TAKE 1 TABLET BY MOUTH TWICE DAILY     spironolactone 25 MG tablet  Commonly known as:  ALDACTONE  Take 1 tablet (25 mg total) by mouth daily.     warfarin 4 MG tablet  Commonly known as:  COUMADIN  Take 4 mg by mouth daily.     warfarin 1 MG tablet  Commonly known as:  COUMADIN  Take 0.5 mg by mouth daily.       Duration of Discharge Encounter: Greater than 30 minutes including physician  time.  Darrick Huntsman, PA-C 03/27/2014, 7:26 AM   Thompson Grayer MD

## 2014-03-27 NOTE — Op Note (Signed)
Cody Gomez, Cody Gomez                 ACCOUNT NO.:  [id number]  MEDICAL RECORD NO.:  [id number]  LOCATION:  3W38C                        FACILITY:  [facility]  PHYSICIAN:  Thompson Grayer, MD       DATE OF BIRTH:  1931/02/09  DATE OF PROCEDURE: DATE OF DISCHARGE:                              OPERATIVE REPORT   SURGEON:  Thompson Grayer, MD  PRE-PROCEDURE DIAGNOSES: 1. Ventricular tachycardia. 2. Nonischemic cardiomyopathy.  POSTPROCEDURE DIAGNOSES: 1. Ventricular tachycardia. 2. Nonischemic cardiomyopathy.  PROCEDURES: 1. Comprehensive EP study. 2. Coronary sinus pacing and recording. 3. Three-dimensional mapping of ventricular tachycardia. 4. Radiofrequency ablation of ventricular tachycardia. 5. Left ventricular pacing and recording. 6. Arrhythmia induction with pacing following ablation. 7. Dual-chamber ICD interrogation and reprogramming.  INTRODUCTION:  Mr. Nyman is a pleasant 80 year old gentleman with a nonischemic cardiomyopathy.  He is status post prior ICD implantation by Dr. [name].  He has developed recurrent symptomatic ventricular tachycardia for which he has received multiple rounds of therapy from his defibrillator.  He has predominantly received ATP therapy though he has received prior ICD shocks.  His tachycardia cycle length was 350 milliseconds.  He has failed medical therapy with multiple antiarrhythmic drugs.  He therefore presents today for EP study and radiofrequency ablation.  DESCRIPTION OF THE PROCEDURE:  Informed written consent was obtained and the patient was brought to the electrophysiology lab in the fasting state.  He was adequately sedated with general anesthesia as outlined in the anesthesia report.  The patient's right groin was prepped and draped in the usual sterile fashion by the EP lab staff.  His ICD was interrogated and tachycardia therapies were programmed off.  Using a percutaneous Seldinger technique, one 6, one 7, and one  8-French hemostasis sheaths were placed in the right common femoral vein.  An 8- [place] hemostasis sheath was placed in the right common femoral artery for blood pressure monitoring.  A 7-French Biosense Webster decapolar catheter was introduced through the right common femoral vein and advanced into the coronary sinus for recording and pacing from this location.  A 6-French quadripolar Josephson catheter was introduced through the right common femoral vein and advanced into the right ventricle for recording and pacing.  This catheter was pulled back to the His bundle location.  The patient presented to the electrophysiology lab in an atrial paced rhythm.  His average RR interval was at 70 beats per minute.  His AH interval measured 107 milliseconds with an HV interval 75 milliseconds.  Ventricular pacing was performed, which revealed VA dissociation at baseline.  Atrial pacing was performed, which revealed an AV Wenckebach cycle length of 480 milliseconds.  No arrhythmias were induced with rapid atrial pacing down to a cycle length of 250 milliseconds.  Ventricular extrastimulus testing was then performed and incessant ventricular tachycardia was induced with ventricular pacing.  The basic cycle length of 500 milliseconds with a single extrastimuli at 350 milliseconds.  The patient was observed to have 4 different VT morphologies today.  VT1 had a cycle length of 450 milliseconds.  This was a left bundle-branch VT with a QRS duration of 150 milliseconds with a transition at V2.  This was a inferior  axis VT. Mapping of this tachycardia was performed using a 3.5 mm Biosense Webster EZ [employer] ablation catheter.  This demonstrated that the VT appeared to arise from the basal septal left ventricle.  The tachycardia, however, being transitioned into second ventricular tachycardia.  This was VT2.  This was a right bundle-branch VT with a cycle length of 520 milliseconds and a QRS  width of 130 milliseconds. This was a right bundle-branch left superior axis VT.  This VT was mapped to the inferoseptal basal portion of the left ventricle.  Pace mapping was performed by pacing from the left ventricle.  A 96.5% pace map match was observed.  Radiofrequency current was delivered with a target temperature of 40 degrees at 35 watts for 90 seconds with termination of VT2.  The patient then had inducible VT3.  This was a right bundle-branch VT with a cycle length of 436 milliseconds and a QRS width of 165 milliseconds.  This was a right bundle-branch inferior axis VT.  This VT was successfully mapped.  Left ventricular pacing revealed a pace map match of 98% when pacing from the anteroseptal base of the left ventricle.  Radiofrequency current was delivered in this location with termination of VT3.  Ventricular extrastimulus testing was again performed and with S1, S2 extrastimuli VT was no longer inducible.  With a third extrastimuli, the patient did have fourth ventricular tachycardia induced when pacing at 500/360/300 milliseconds.  This was a fourth VT.  The fourth ventricular tachycardia today was a right bundle- branch VT at a cycle length of 550 milliseconds and the QRS duration was 150 milliseconds.  This is actually a left bundle-branch VT with a transition from V1 to V2.  This was an inferior axis VT.  Activation mapping of this tachycardia revealed that it arose from the inferobasal septum of the left ventricle.  A 95.5% pace map match was observed in this location.  Radiofrequency current was delivered with termination of this tachycardia.  Following ablation, additional rapid atrial pacing was observed and ventricular tachycardia #1 was observed.  Extensive mapping of VT1 was performed.  This appeared to arise from the anteroseptal portion of the left ventricle.  The earliest activation recorded in this location, however, revealed only a 85% pace map  match. Extensive radiofrequency current was delivered in this location, however, ventricular tachycardia #1 could not be terminated today.  The tachycardia terminated with catheter manipulation and was no longer inducible.  The procedure was therefore considered completed but all catheters were removed and the sheaths were aspirated and flushed.  The sheaths were removed and hemostasis was assured.  The patient's defibrillator is interrogated and atrial and ventricular pace, sense, and impedance values were unchanged from prior to the procedure.  There were no early apparent complications.  CONCLUSIONS: 1. Atrial pacing upon presentation. 2. No evidence of accessory pathways or inducible supraventricular     tachycardias today. 3. The patient had easily inducible and incessant ventricular     tachycardia. Voltage mapping revealed no scar within the left ventricle.  Four separate ventricular tachycardias were mapped and 3 of the 4 were     successfully ablated. 4. No early apparent complications.   Thompson Grayer, MD     JA/MEDQ  D:  03/26/2014  T:  03/27/2014  Job:  [id number]  cc:   Deboraha Sprang, MD, [facility]

## 2014-03-28 ENCOUNTER — Encounter: Payer: Self-pay | Admitting: Cardiology

## 2014-03-28 LAB — POCT ACTIVATED CLOTTING TIME
ACTIVATED CLOTTING TIME: 232 s
Activated Clotting Time: 193 seconds
Activated Clotting Time: 265 seconds

## 2014-04-03 ENCOUNTER — Telehealth: Payer: Self-pay | Admitting: Internal Medicine

## 2014-04-03 NOTE — Telephone Encounter (Signed)
New Message:  Pt states he is calling to get clarification on his bandage care... States he has had a bandage on his groin since 4/14... Wants to know how long to leave it there or when to remove it and what to do afterwards.Marland KitchenMarland Kitchen

## 2014-04-04 NOTE — Telephone Encounter (Signed)
Okay to remove the bandage from his groin site.  Wash with mild soapy water.  He verbalized understanding and will call back if needed

## 2014-04-04 NOTE — Telephone Encounter (Signed)
F/u   Please advise pt of previous message from yesterday.

## 2014-04-08 ENCOUNTER — Ambulatory Visit (INDEPENDENT_AMBULATORY_CARE_PROVIDER_SITE_OTHER): Payer: Medicare Other | Admitting: *Deleted

## 2014-04-08 DIAGNOSIS — I428 Other cardiomyopathies: Secondary | ICD-10-CM | POA: Diagnosis not present

## 2014-04-13 LAB — MDC_IDC_ENUM_SESS_TYPE_REMOTE
Battery Remaining Longevity: 84 mo
Brady Statistic AP VP Percent: 97.61 %
Brady Statistic AP VS Percent: 1.14 %
Brady Statistic AS VP Percent: 1.24 %
Brady Statistic AS VS Percent: 0.01 %
Brady Statistic RV Percent Paced: 98.85 %
Date Time Interrogation Session: 20150427144511
HIGH POWER IMPEDANCE MEASURED VALUE: 68 Ohm
HighPow Impedance: 285 Ohm
HighPow Impedance: 55 Ohm
Lead Channel Impedance Value: 608 Ohm
Lead Channel Pacing Threshold Amplitude: 0.375 V
Lead Channel Pacing Threshold Pulse Width: 0.4 ms
Lead Channel Pacing Threshold Pulse Width: 0.4 ms
Lead Channel Setting Pacing Pulse Width: 0.4 ms
Lead Channel Setting Sensing Sensitivity: 0.45 mV
MDC IDC MSMT BATTERY VOLTAGE: 3.01 V
MDC IDC MSMT LEADCHNL RA IMPEDANCE VALUE: 418 Ohm
MDC IDC MSMT LEADCHNL RA SENSING INTR AMPL: 2.375 mV
MDC IDC MSMT LEADCHNL RV PACING THRESHOLD AMPLITUDE: 1.375 V
MDC IDC MSMT LEADCHNL RV SENSING INTR AMPL: 7.375 mV
MDC IDC SET LEADCHNL RA PACING AMPLITUDE: 1.5 V
MDC IDC SET LEADCHNL RV PACING AMPLITUDE: 2.75 V
MDC IDC SET ZONE DETECTION INTERVAL: 290 ms
MDC IDC STAT BRADY RA PERCENT PACED: 98.75 %
Zone Setting Detection Interval: 250 ms
Zone Setting Detection Interval: 350 ms
Zone Setting Detection Interval: 410 ms
Zone Setting Detection Interval: 450 ms

## 2014-04-30 ENCOUNTER — Encounter: Payer: Self-pay | Admitting: Internal Medicine

## 2014-05-02 ENCOUNTER — Encounter: Payer: Self-pay | Admitting: Internal Medicine

## 2014-05-02 ENCOUNTER — Ambulatory Visit (INDEPENDENT_AMBULATORY_CARE_PROVIDER_SITE_OTHER): Payer: Medicare Other | Admitting: Internal Medicine

## 2014-05-02 VITALS — BP 130/64 | HR 78 | Ht 67.0 in | Wt 195.0 lb

## 2014-05-02 DIAGNOSIS — I472 Ventricular tachycardia, unspecified: Secondary | ICD-10-CM

## 2014-05-02 DIAGNOSIS — I428 Other cardiomyopathies: Secondary | ICD-10-CM

## 2014-05-02 DIAGNOSIS — I4891 Unspecified atrial fibrillation: Secondary | ICD-10-CM | POA: Diagnosis not present

## 2014-05-02 DIAGNOSIS — I509 Heart failure, unspecified: Secondary | ICD-10-CM

## 2014-05-02 DIAGNOSIS — I4729 Other ventricular tachycardia: Secondary | ICD-10-CM

## 2014-05-02 MED ORDER — DOFETILIDE 500 MCG PO CAPS: 500.0000 ug | ORAL_CAPSULE | Freq: Two times a day (BID) | ORAL | Status: DC
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
## 2014-05-02 NOTE — Patient Instructions (Addendum)
Remote monitoring is used to monitor your ICD from home. This monitoring reduces the number of office visits required to check your device to one time per year. It allows [place] to keep an eye on the functioning of your device to ensure it is working properly. You are scheduled for a device check from home on 08-05-2014. You may send your transmission at any time that day. If you have a wireless device, the transmission will be sent automatically. After your physician reviews your transmission, you will receive a postcard with your next transmission date.   Your physician recommends that you schedule a follow-up appointment as scheduled with Dr [name] and as needed with Dr Rayann Heman

## 2014-05-03 ENCOUNTER — Encounter: Payer: Self-pay | Admitting: Internal Medicine

## 2014-05-03 DIAGNOSIS — C61 Malignant neoplasm of prostate: Secondary | ICD-10-CM | POA: Diagnosis not present

## 2014-05-03 DIAGNOSIS — N390 Urinary tract infection, site not specified: Secondary | ICD-10-CM | POA: Diagnosis not present

## 2014-05-03 DIAGNOSIS — R5381 Other malaise: Secondary | ICD-10-CM | POA: Diagnosis not present

## 2014-05-03 DIAGNOSIS — R3129 Other microscopic hematuria: Secondary | ICD-10-CM | POA: Diagnosis not present

## 2014-05-05 DIAGNOSIS — N390 Urinary tract infection, site not specified: Secondary | ICD-10-CM | POA: Diagnosis not present

## 2014-05-07 DIAGNOSIS — Z7901 Long term (current) use of anticoagulants: Secondary | ICD-10-CM | POA: Diagnosis not present

## 2014-05-13 DIAGNOSIS — C61 Malignant neoplasm of prostate: Secondary | ICD-10-CM | POA: Diagnosis not present

## 2014-05-13 DIAGNOSIS — N390 Urinary tract infection, site not specified: Secondary | ICD-10-CM | POA: Diagnosis not present

## 2014-05-13 DIAGNOSIS — N318 Other neuromuscular dysfunction of bladder: Secondary | ICD-10-CM | POA: Diagnosis not present

## 2014-05-14 LAB — MDC_IDC_ENUM_SESS_TYPE_INCLINIC
Battery Remaining Longevity: 7
Brady Statistic AP VP Percent: 95.9 %
Brady Statistic AP VS Percent: 1.9 %
Brady Statistic AS VP Percent: 2 %
Brady Statistic AS VS Percent: 0.1 %
Lead Channel Impedance Value: 418 Ohm
Lead Channel Pacing Threshold Amplitude: 0.5 V
Lead Channel Pacing Threshold Pulse Width: 0.4 ms
Lead Channel Sensing Intrinsic Amplitude: 2.4 mV
Lead Channel Sensing Intrinsic Amplitude: 6.3 mV
Lead Channel Setting Pacing Pulse Width: 0.4 ms
MDC IDC MSMT LEADCHNL RV IMPEDANCE VALUE: 646 Ohm
MDC IDC MSMT LEADCHNL RV PACING THRESHOLD AMPLITUDE: 1.25 V
MDC IDC MSMT LEADCHNL RV PACING THRESHOLD PULSEWIDTH: 0.4 ms
MDC IDC SET LEADCHNL RA PACING AMPLITUDE: 2 V
MDC IDC SET LEADCHNL RV PACING AMPLITUDE: 2.5 V
MDC IDC SET LEADCHNL RV SENSING SENSITIVITY: 0.45 mV
MDC IDC SET ZONE DETECTION INTERVAL: 250 ms
MDC IDC SET ZONE DETECTION INTERVAL: 350 ms
MDC IDC SET ZONE DETECTION INTERVAL: 450 ms
Zone Setting Detection Interval: 290 ms
Zone Setting Detection Interval: 410 ms

## 2014-05-15 ENCOUNTER — Other Ambulatory Visit: Payer: Self-pay | Admitting: *Deleted

## 2014-05-15 DIAGNOSIS — I472 Ventricular tachycardia, unspecified: Secondary | ICD-10-CM

## 2014-05-15 MED ORDER — CARVEDILOL 25 MG PO TABS: 25.0000 mg | ORAL_TABLET | Freq: Two times a day (BID) | ORAL | Status: DC

## 2014-05-16 ENCOUNTER — Other Ambulatory Visit: Payer: Self-pay | Admitting: *Deleted

## 2014-05-16 MED ORDER — POTASSIUM CHLORIDE CRYS ER 20 MEQ PO TBCR: EXTENDED_RELEASE_TABLET | ORAL | Status: DC

## 2014-05-21 ENCOUNTER — Ambulatory Visit (INDEPENDENT_AMBULATORY_CARE_PROVIDER_SITE_OTHER): Payer: Self-pay | Admitting: Critical Care Medicine

## 2014-05-21 ENCOUNTER — Encounter: Payer: Self-pay | Admitting: Critical Care Medicine

## 2014-05-21 VITALS — BP 114/60 | HR 83 | Temp 96.9°F

## 2014-05-21 DIAGNOSIS — J209 Acute bronchitis, unspecified: Secondary | ICD-10-CM | POA: Diagnosis not present

## 2014-05-21 DIAGNOSIS — J441 Chronic obstructive pulmonary disease with (acute) exacerbation: Secondary | ICD-10-CM | POA: Diagnosis not present

## 2014-05-21 DIAGNOSIS — R0602 Shortness of breath: Secondary | ICD-10-CM | POA: Diagnosis not present

## 2014-05-21 DIAGNOSIS — R0989 Other specified symptoms and signs involving the circulatory and respiratory systems: Secondary | ICD-10-CM | POA: Diagnosis not present

## 2014-05-21 DIAGNOSIS — R05 Cough: Secondary | ICD-10-CM | POA: Diagnosis not present

## 2014-05-21 DIAGNOSIS — R059 Cough, unspecified: Secondary | ICD-10-CM | POA: Diagnosis not present

## 2014-05-21 DIAGNOSIS — J449 Chronic obstructive pulmonary disease, unspecified: Secondary | ICD-10-CM

## 2014-05-21 DIAGNOSIS — Z7901 Long term (current) use of anticoagulants: Secondary | ICD-10-CM | POA: Diagnosis not present

## 2014-05-21 DIAGNOSIS — IMO0001 Reserved for inherently not codable concepts without codable children: Secondary | ICD-10-CM

## 2014-05-21 MED ORDER — ALBUTEROL SULFATE (2.5 MG/3ML) 0.083% IN NEBU
2.5000 mg | INHALATION_SOLUTION | Freq: Once | RESPIRATORY_TRACT | Status: AC
  Administered 2014-05-21: 2.5 mg via RESPIRATORY_TRACT

## 2014-05-21 MED ORDER — DOXYCYCLINE HYCLATE 100 MG PO TBEC: 100.0000 mg | DELAYED_RELEASE_TABLET | Freq: Two times a day (BID) | ORAL | Status: DC

## 2014-05-21 MED ORDER — PREDNISONE 10 MG PO TABS: ORAL_TABLET | ORAL | Status: DC

## 2014-05-21 NOTE — Patient Instructions (Addendum)
Doxycycline one twice daily Prednisone 10mg  Take 4 for three days 3 for three days 2 for three days 1 for three days and stop While on doxycycline, check your protime, the antibiotic interacts with coumadin to make it work stronger A nebulizer treatment was given No other medication changes Cxr today Return 1 month

## 2014-05-21 NOTE — Progress Notes (Signed)
Subjective:    Patient ID: Cody Gomez, male    DOB: 1930/12/27, 80 y.o.   MRN: [id number]  HPI  80 y.o. WM with Copd and reversable components  05/21/2014 Chief Complaint  Patient presents with  . Acute Visit    Increased SOB started 1-2 hours ago.  Uses albuterol hfa with no relief.  Also c/o HA and chest tightness.     Pt suddenly worse .  Notes same amount of cough.  Using albuterol frequently Notes some tightness in chest . Notes a dull achenss No real fever  Review of Systems  Constitutional:   No  weight loss, night sweats,  Fevers, chills, fatigue, lassitude. HEENT:   No headaches,  Difficulty swallowing,  Tooth/dental problems,  Sore throat,                No sneezing, itching, ear ache, nasal congestion, post nasal drip,   CV:  No chest pain,  Orthopnea, PND, swelling in lower extremities, anasarca, dizziness, palpitations  GI  No heartburn, indigestion, abdominal pain, nausea, vomiting, diarrhea, change in bowel habits, loss of appetite  Resp: Notes  shortness of breath with exertion not at rest.  Notes excess mucus, notes productive cough,  No non-productive cough,  No coughing up of blood.  Notes change in color of mucus.  No wheezing.  No chest wall deformity  Skin: no rash or lesions.  GU: no dysuria, change in color of urine, no urgency or frequency.  No flank pain.  MS:  No joint pain or swelling.  No decreased range of motion.  No back pain.  Psych:  No change in mood or affect. No depression or anxiety.  No memory loss.     Objective:   Physical Exam  Filed Vitals:   05/21/14 1506  BP: 114/60  Pulse: 83  Temp: 96.9 F (36.1 C)  TempSrc: Oral  SpO2: 96%    Gen: Pleasant, well-nourished, in no distress,  normal affect  ENT: No lesions,  mouth clear,  oropharynx clear, no postnasal drip  Neck: No JVD, no TMG, no carotid bruits  Lungs: No use of accessory muscles, no dullness to percussion, distant BS, exp wheezes  Cardiovascular: RRR, heart  sounds normal, no murmur or gallops, no peripheral edema  Abdomen: soft and NT, no HSM,  BS normal  Musculoskeletal: No deformities, no cyanosis or clubbing  Neuro: alert, non focal  Skin: Warm, no lesions or rashes       Assessment & Plan:   COPD with bronchitis Gold stage B Acute tracheobronchitis with flare  Plan Doxycycline one twice daily Prednisone 10mg  Take 4 for three days 3 for three days 2 for three days 1 for three days and stop A nebulizer treatment was given No other medication changes     Updated Medication List Outpatient Encounter Prescriptions as of 05/21/2014  Medication Sig  . atorvastatin (LIPITOR) 10 MG tablet Take 1 tablet by mouth daily.  . carvedilol (COREG) 25 MG tablet Take 1 tablet (25 mg total) by mouth 2 (two) times daily.  Marland Kitchen dofetilide (TIKOSYN) 500 MCG capsule Take 1 capsule (500 mcg total) by mouth 2 (two) times daily.  . Fluticasone-Salmeterol (ADVAIR DISKUS) 250-50 MCG/DOSE AEPB Inhale 1 puff into the lungs 2 (two) times daily.  . furosemide (LASIX) 40 MG tablet TAKE 1 TABLET BY MOUTH DAILY  . guaiFENesin (MUCINEX) 600 MG 12 hr tablet Take 600 mg by mouth at bedtime.  . methimazole (TAPAZOLE) 10 MG tablet Take 10  mg by mouth daily.    Marland Kitchen mexiletine (MEXITIL) 150 MG capsule TAKE ONE CAPSULE BY MOUTH TWICE DAILY  . NITROSTAT 0.4 MG SL tablet Place 1 tablet under the tongue every 5 (five) minutes as needed for chest pain.   . potassium chloride SA (K-DUR,KLOR-CON) 20 MEQ tablet TAKE 1 TABLET BY MOUTH TWICE DAILY  . spironolactone (ALDACTONE) 25 MG tablet Take 1 tablet (25 mg total) by mouth daily.  Marland Kitchen warfarin (COUMADIN) 5 MG tablet Take 5 mg by mouth daily.  Marland Kitchen albuterol (PROVENTIL) (2.5 MG/3ML) 0.083% nebulizer solution Take 2.5 mg by nebulization every 6 (six) hours as needed for shortness of breath.   . doxycycline (DORYX) 100 MG EC tablet Take 1 tablet (100 mg total) by mouth 2 (two) times daily.  . predniSONE (DELTASONE) 10 MG tablet Take 4  for two days three for two days two for two days one for two days  . [DISCONTINUED] warfarin (COUMADIN) 1 MG tablet Take 0.5 mg by mouth daily.  . [DISCONTINUED] warfarin (COUMADIN) 4 MG tablet Take 4 mg by mouth daily.  . [EXPIRED] albuterol (PROVENTIL) (2.5 MG/3ML) 0.083% nebulizer solution 2.5 mg

## 2014-05-23 NOTE — Assessment & Plan Note (Signed)
Acute tracheobronchitis with flare  Plan Doxycycline one twice daily Prednisone 10mg  Take 4 for three days 3 for three days 2 for three days 1 for three days and stop A nebulizer treatment was given No other medication changes

## 2014-05-24 ENCOUNTER — Telehealth: Payer: Self-pay | Admitting: *Deleted

## 2014-05-24 NOTE — Telephone Encounter (Signed)
Pt saw PW on 05-21-14 in [place]. CXR was ordered and results received and reviewed. Per Dr. Joya Gaskins advise the pt that CXR is normal. Pt advised. Copy placed in scan. Wardner Bing, CMA

## 2014-06-13 ENCOUNTER — Encounter: Payer: Self-pay | Admitting: Critical Care Medicine

## 2014-06-18 DIAGNOSIS — Z79899 Other long term (current) drug therapy: Secondary | ICD-10-CM | POA: Diagnosis not present

## 2014-06-18 DIAGNOSIS — Z23 Encounter for immunization: Secondary | ICD-10-CM | POA: Diagnosis not present

## 2014-06-18 DIAGNOSIS — E7800 Pure hypercholesterolemia, unspecified: Secondary | ICD-10-CM | POA: Diagnosis not present

## 2014-06-25 ENCOUNTER — Ambulatory Visit (INDEPENDENT_AMBULATORY_CARE_PROVIDER_SITE_OTHER): Payer: Medicare Other | Admitting: Critical Care Medicine

## 2014-06-25 ENCOUNTER — Encounter: Payer: Self-pay | Admitting: Critical Care Medicine

## 2014-06-25 VITALS — BP 94/60 | HR 69 | Temp 96.9°F | Ht 67.0 in | Wt 199.2 lb

## 2014-06-25 DIAGNOSIS — J449 Chronic obstructive pulmonary disease, unspecified: Secondary | ICD-10-CM

## 2014-06-25 DIAGNOSIS — IMO0001 Reserved for inherently not codable concepts without codable children: Secondary | ICD-10-CM

## 2014-06-25 MED ORDER — ALBUTEROL SULFATE 108 (90 BASE) MCG/ACT IN AEPB: 2.0000 | INHALATION_SPRAY | Freq: Four times a day (QID) | RESPIRATORY_TRACT | Status: DC | PRN

## 2014-06-25 MED ORDER — ALBUTEROL SULFATE (2.5 MG/3ML) 0.083% IN NEBU: 2.5000 mg | INHALATION_SOLUTION | Freq: Four times a day (QID) | RESPIRATORY_TRACT | Status: DC | PRN

## 2014-06-25 NOTE — Patient Instructions (Signed)
Stay on advair Use proair as needed (notice respiclik) Return 3 months

## 2014-06-25 NOTE — Progress Notes (Signed)
Subjective:    Patient ID: Cody Gomez, male    DOB: July 24, 1931, 80 y.o.   MRN: [id number]  HPI  80 y.o. WM with Copd and reversable components  06/25/2014 Chief Complaint  Patient presents with  . Follow-up    Feeling better-sob better,cough-white,no wheezing,denies cp or tightness  Not as much cough. Less dyspnea. No green mucus.  No chest pain noted. Note pcp gave prevnar 13 recently   Review of Systems  Constitutional:   No  weight loss, night sweats,  Fevers, chills, fatigue, lassitude. HEENT:   No headaches,  Difficulty swallowing,  Tooth/dental problems,  Sore throat,                No sneezing, itching, ear ache, nasal congestion, post nasal drip,   CV:  No chest pain,  Orthopnea, PND, swelling in lower extremities, anasarca, dizziness, palpitations  GI  No heartburn, indigestion, abdominal pain, nausea, vomiting, diarrhea, change in bowel habits, loss of appetite  Resp: Notes  shortness of breath with exertion not at rest.  Notes excess mucus, notes productive cough,  No non-productive cough,  No coughing up of blood.  Notes change in color of mucus.  No wheezing.  No chest wall deformity  Skin: no rash or lesions.  GU: no dysuria, change in color of urine, no urgency or frequency.  No flank pain.  MS:  No joint pain or swelling.  No decreased range of motion.  No back pain.  Psych:  No change in mood or affect. No depression or anxiety.  No memory loss.     Objective:   Physical Exam  Filed Vitals:   06/25/14 1423  BP: 94/60  Pulse: 69  Temp: 96.9 F (36.1 C)  TempSrc: Oral  Height: 5\' 7"  (1.702 m)  Weight: 199 lb 3.2 oz (90.357 kg)  SpO2: 96%    Gen: Pleasant, well-nourished, in no distress,  normal affect  ENT: No lesions,  mouth clear,  oropharynx clear, no postnasal drip  Neck: No JVD, no TMG, no carotid bruits  Lungs: No use of accessory muscles, no dullness to percussion, distant BS, no wheezes  Cardiovascular: RRR, heart sounds normal, no  murmur or gallops, no peripheral edema  Abdomen: soft and NT, no HSM,  BS normal  Musculoskeletal: No deformities, no cyanosis or clubbing  Neuro: alert, non focal  Skin: Warm, no lesions or rashes       Assessment & Plan:   COPD with bronchitis Gold stage B Copd gold stage B Plan Stay on advair Use proair as needed     Updated Medication List Outpatient Encounter Prescriptions as of 06/25/2014  Medication Sig  . atorvastatin (LIPITOR) 10 MG tablet Take 1 tablet by mouth daily.  . carvedilol (COREG) 25 MG tablet Take 1 tablet (25 mg total) by mouth 2 (two) times daily.  Marland Kitchen dofetilide (TIKOSYN) 500 MCG capsule Take 1 capsule (500 mcg total) by mouth 2 (two) times daily.  . Fluticasone-Salmeterol (ADVAIR DISKUS) 250-50 MCG/DOSE AEPB Inhale 1 puff into the lungs 2 (two) times daily.  . furosemide (LASIX) 40 MG tablet TAKE 1 TABLET BY MOUTH DAILY  . guaiFENesin (MUCINEX) 600 MG 12 hr tablet Take 600 mg by mouth at bedtime.  . methimazole (TAPAZOLE) 10 MG tablet Take 10 mg by mouth daily.    Marland Kitchen mexiletine (MEXITIL) 150 MG capsule TAKE ONE CAPSULE BY MOUTH TWICE DAILY  . NITROSTAT 0.4 MG SL tablet Place 1 tablet under the tongue every 5 (five) minutes  as needed for chest pain.   Marland Kitchen nystatin-triamcinolone (MYCOLOG II) cream As needed  . potassium chloride SA (K-DUR,KLOR-CON) 20 MEQ tablet TAKE 1 TABLET BY MOUTH TWICE DAILY  . spironolactone (ALDACTONE) 25 MG tablet Take 1 tablet (25 mg total) by mouth daily.  Marland Kitchen warfarin (COUMADIN) 5 MG tablet Take 5 mg by mouth daily.  Marland Kitchen albuterol (PROVENTIL) (2.5 MG/3ML) 0.083% nebulizer solution Take 3 mLs (2.5 mg total) by nebulization every 6 (six) hours as needed for shortness of breath.  . Albuterol Sulfate (PROAIR RESPICLICK) 092 (90 BASE) MCG/ACT AEPB Inhale 2 puffs into the lungs every 6 (six) hours as needed.  . [DISCONTINUED] albuterol (PROVENTIL) (2.5 MG/3ML) 0.083% nebulizer solution Take 2.5 mg by nebulization every 6 (six) hours as needed  for shortness of breath.   . [DISCONTINUED] doxycycline (DORYX) 100 MG EC tablet Take 1 tablet (100 mg total) by mouth 2 (two) times daily.  . [DISCONTINUED] predniSONE (DELTASONE) 10 MG tablet Take 4 for two days three for two days two for two days one for two days

## 2014-06-27 NOTE — Assessment & Plan Note (Signed)
Copd gold stage B Plan Stay on advair Use proair as needed

## 2014-07-04 ENCOUNTER — Ambulatory Visit (INDEPENDENT_AMBULATORY_CARE_PROVIDER_SITE_OTHER): Payer: Medicare Other | Admitting: Internal Medicine

## 2014-07-04 ENCOUNTER — Encounter: Payer: Self-pay | Admitting: Internal Medicine

## 2014-07-04 VITALS — BP 127/75 | HR 72 | Ht 67.0 in | Wt 200.0 lb

## 2014-07-04 DIAGNOSIS — I428 Other cardiomyopathies: Secondary | ICD-10-CM

## 2014-07-04 DIAGNOSIS — I4729 Other ventricular tachycardia: Secondary | ICD-10-CM

## 2014-07-04 DIAGNOSIS — I4891 Unspecified atrial fibrillation: Secondary | ICD-10-CM | POA: Diagnosis not present

## 2014-07-04 DIAGNOSIS — Z95810 Presence of automatic (implantable) cardiac defibrillator: Secondary | ICD-10-CM

## 2014-07-04 DIAGNOSIS — I480 Paroxysmal atrial fibrillation: Secondary | ICD-10-CM

## 2014-07-04 DIAGNOSIS — I472 Ventricular tachycardia, unspecified: Secondary | ICD-10-CM

## 2014-07-04 DIAGNOSIS — I509 Heart failure, unspecified: Secondary | ICD-10-CM

## 2014-07-04 LAB — MDC_IDC_ENUM_SESS_TYPE_INCLINIC
Battery Remaining Longevity: 86 mo
Battery Voltage: 3 V
Brady Statistic AP VP Percent: 96.78 %
Brady Statistic AS VP Percent: 1.96 %
Brady Statistic AS VS Percent: 0.07 %
Brady Statistic RV Percent Paced: 98.74 %
HighPow Impedance: 285 Ohm
HighPow Impedance: 56 Ohm
HighPow Impedance: 67 Ohm
Lead Channel Impedance Value: 418 Ohm
Lead Channel Impedance Value: 608 Ohm
Lead Channel Pacing Threshold Amplitude: 0.5 V
Lead Channel Pacing Threshold Amplitude: 1.25 V
Lead Channel Sensing Intrinsic Amplitude: 9.375 mV
Lead Channel Sensing Intrinsic Amplitude: 9.375 mV
Lead Channel Setting Pacing Amplitude: 2 V
Lead Channel Setting Pacing Amplitude: 2.5 V
Lead Channel Setting Pacing Pulse Width: 0.4 ms
Lead Channel Setting Sensing Sensitivity: 0.45 mV
MDC IDC MSMT LEADCHNL RA PACING THRESHOLD PULSEWIDTH: 0.4 ms
MDC IDC MSMT LEADCHNL RA SENSING INTR AMPL: 2.125 mV
MDC IDC MSMT LEADCHNL RA SENSING INTR AMPL: 3.375 mV
MDC IDC MSMT LEADCHNL RV PACING THRESHOLD PULSEWIDTH: 0.4 ms
MDC IDC SESS DTM: 20150723150838
MDC IDC SET ZONE DETECTION INTERVAL: 250 ms
MDC IDC SET ZONE DETECTION INTERVAL: 290 ms
MDC IDC SET ZONE DETECTION INTERVAL: 410 ms
MDC IDC STAT BRADY AP VS PERCENT: 1.2 %
MDC IDC STAT BRADY RA PERCENT PACED: 97.98 %
Zone Setting Detection Interval: 350 ms
Zone Setting Detection Interval: 450 ms
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
## 2014-07-04 NOTE — Patient Instructions (Signed)

## 2014-07-30 DIAGNOSIS — Z7901 Long term (current) use of anticoagulants: Secondary | ICD-10-CM | POA: Diagnosis not present

## 2014-08-19 ENCOUNTER — Other Ambulatory Visit: Payer: Self-pay | Admitting: Internal Medicine

## 2014-08-23 ENCOUNTER — Other Ambulatory Visit: Payer: Self-pay | Admitting: Internal Medicine

## 2014-08-29 DIAGNOSIS — C61 Malignant neoplasm of prostate: Secondary | ICD-10-CM | POA: Diagnosis not present

## 2014-08-29 DIAGNOSIS — N529 Male erectile dysfunction, unspecified: Secondary | ICD-10-CM | POA: Diagnosis not present

## 2014-08-30 DIAGNOSIS — Z7901 Long term (current) use of anticoagulants: Secondary | ICD-10-CM | POA: Diagnosis not present

## 2014-09-10 DIAGNOSIS — Z23 Encounter for immunization: Secondary | ICD-10-CM | POA: Diagnosis not present

## 2014-09-12 DIAGNOSIS — L820 Inflamed seborrheic keratosis: Secondary | ICD-10-CM | POA: Diagnosis not present

## 2014-09-12 DIAGNOSIS — L578 Other skin changes due to chronic exposure to nonionizing radiation: Secondary | ICD-10-CM | POA: Diagnosis not present

## 2014-09-12 DIAGNOSIS — C44319 Basal cell carcinoma of skin of other parts of face: Secondary | ICD-10-CM | POA: Diagnosis not present

## 2014-09-12 DIAGNOSIS — L821 Other seborrheic keratosis: Secondary | ICD-10-CM | POA: Diagnosis not present

## 2014-09-17 ENCOUNTER — Other Ambulatory Visit: Payer: Self-pay | Admitting: Internal Medicine

## 2014-09-18 DIAGNOSIS — C44319 Basal cell carcinoma of skin of other parts of face: Secondary | ICD-10-CM | POA: Diagnosis not present

## 2014-10-02 ENCOUNTER — Telehealth: Payer: Self-pay | Admitting: Internal Medicine

## 2014-10-02 NOTE — Telephone Encounter (Signed)
Pt informed transmission is due on Monday 10-07-14

## 2014-10-02 NOTE — Telephone Encounter (Signed)
New message          Has questions about how to send a transmission

## 2014-10-07 ENCOUNTER — Ambulatory Visit (INDEPENDENT_AMBULATORY_CARE_PROVIDER_SITE_OTHER): Payer: Medicare Other | Admitting: *Deleted

## 2014-10-07 DIAGNOSIS — I428 Other cardiomyopathies: Secondary | ICD-10-CM

## 2014-10-07 DIAGNOSIS — I429 Cardiomyopathy, unspecified: Secondary | ICD-10-CM | POA: Diagnosis not present

## 2014-10-07 NOTE — Progress Notes (Signed)
Remote ICD transmission.   

## 2014-10-09 ENCOUNTER — Telehealth: Payer: Self-pay | Admitting: *Deleted

## 2014-10-10 NOTE — Telephone Encounter (Signed)
Patient informed about recent treated episodes of VT. Pt states that he hasn't had any symptoms of any kind. He's aware of driving restrictions. Pt understands that we will call him back if SK has any further recommendations. Patient voiced understanding about all info given.

## 2014-10-10 NOTE — Telephone Encounter (Signed)
New message ° ° ° ° ° °Returning Shakila's call °

## 2014-10-11 LAB — MDC_IDC_ENUM_SESS_TYPE_REMOTE
Battery Voltage: 3 V
Brady Statistic AP VP Percent: 96.95 %
Brady Statistic AP VS Percent: 0.53 %
Brady Statistic AS VP Percent: 2.45 %
Brady Statistic RA Percent Paced: 97.48 %
Brady Statistic RV Percent Paced: 99.4 %
Date Time Interrogation Session: 20151026140256
HIGH POWER IMPEDANCE MEASURED VALUE: 54 Ohm
HighPow Impedance: 63 Ohm
Lead Channel Impedance Value: 513 Ohm
Lead Channel Impedance Value: 608 Ohm
Lead Channel Pacing Threshold Amplitude: 0.375 V
Lead Channel Pacing Threshold Amplitude: 1.375 V
Lead Channel Pacing Threshold Pulse Width: 0.4 ms
Lead Channel Sensing Intrinsic Amplitude: 1.875 mV
Lead Channel Sensing Intrinsic Amplitude: 11.25 mV
Lead Channel Sensing Intrinsic Amplitude: 11.25 mV
Lead Channel Setting Pacing Amplitude: 2 V
Lead Channel Setting Pacing Amplitude: 3.25 V
Lead Channel Setting Pacing Pulse Width: 0.4 ms
Lead Channel Setting Sensing Sensitivity: 0.45 mV
MDC IDC MSMT BATTERY REMAINING LONGEVITY: 72 mo
MDC IDC MSMT LEADCHNL RA IMPEDANCE VALUE: 399 Ohm
MDC IDC MSMT LEADCHNL RA SENSING INTR AMPL: 1.875 mV
MDC IDC MSMT LEADCHNL RV PACING THRESHOLD PULSEWIDTH: 0.4 ms
MDC IDC SET ZONE DETECTION INTERVAL: 250 ms
MDC IDC SET ZONE DETECTION INTERVAL: 350 ms
MDC IDC STAT BRADY AS VS PERCENT: 0.07 %
Zone Setting Detection Interval: 290 ms
Zone Setting Detection Interval: 440 ms
Zone Setting Detection Interval: 470 ms

## 2014-10-15 DIAGNOSIS — J209 Acute bronchitis, unspecified: Secondary | ICD-10-CM | POA: Diagnosis not present

## 2014-10-22 DIAGNOSIS — F17200 Nicotine dependence, unspecified, uncomplicated: Secondary | ICD-10-CM | POA: Diagnosis not present

## 2014-10-22 DIAGNOSIS — R079 Chest pain, unspecified: Secondary | ICD-10-CM | POA: Diagnosis not present

## 2014-10-22 DIAGNOSIS — Z7901 Long term (current) use of anticoagulants: Secondary | ICD-10-CM | POA: Diagnosis not present

## 2014-10-22 DIAGNOSIS — I4891 Unspecified atrial fibrillation: Secondary | ICD-10-CM | POA: Diagnosis not present

## 2014-10-22 DIAGNOSIS — J811 Chronic pulmonary edema: Secondary | ICD-10-CM | POA: Diagnosis not present

## 2014-10-22 DIAGNOSIS — R072 Precordial pain: Secondary | ICD-10-CM | POA: Diagnosis not present

## 2014-10-22 DIAGNOSIS — R0789 Other chest pain: Secondary | ICD-10-CM | POA: Diagnosis not present

## 2014-10-22 DIAGNOSIS — R0602 Shortness of breath: Secondary | ICD-10-CM | POA: Diagnosis not present

## 2014-10-23 ENCOUNTER — Ambulatory Visit (INDEPENDENT_AMBULATORY_CARE_PROVIDER_SITE_OTHER): Payer: Medicare Other | Admitting: Nurse Practitioner

## 2014-10-23 ENCOUNTER — Encounter: Payer: Self-pay | Admitting: Nurse Practitioner

## 2014-10-23 VITALS — BP 98/52 | HR 71 | Ht 67.0 in | Wt 203.8 lb

## 2014-10-23 DIAGNOSIS — I429 Cardiomyopathy, unspecified: Secondary | ICD-10-CM | POA: Diagnosis not present

## 2014-10-23 DIAGNOSIS — I420 Dilated cardiomyopathy: Secondary | ICD-10-CM

## 2014-10-23 DIAGNOSIS — R072 Precordial pain: Secondary | ICD-10-CM

## 2014-10-23 DIAGNOSIS — E785 Hyperlipidemia, unspecified: Secondary | ICD-10-CM

## 2014-10-23 DIAGNOSIS — K219 Gastro-esophageal reflux disease without esophagitis: Secondary | ICD-10-CM

## 2014-10-23 DIAGNOSIS — Z95810 Presence of automatic (implantable) cardiac defibrillator: Secondary | ICD-10-CM | POA: Diagnosis not present

## 2014-10-23 DIAGNOSIS — R0789 Other chest pain: Secondary | ICD-10-CM

## 2014-10-23 DIAGNOSIS — I10 Essential (primary) hypertension: Secondary | ICD-10-CM | POA: Diagnosis not present
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
## 2014-10-23 NOTE — Patient Instructions (Signed)
Your physician has recommended you make the following change in your medication:   START TAKING OMEPRAZOLE 20 MG [place]     Your physician recommends that you schedule a follow-up appointment in:  [place] 3 MONTHS

## 2014-10-24 DIAGNOSIS — Z7901 Long term (current) use of anticoagulants: Secondary | ICD-10-CM | POA: Diagnosis not present

## 2014-10-29 ENCOUNTER — Encounter: Payer: Self-pay | Admitting: Critical Care Medicine

## 2014-10-29 ENCOUNTER — Ambulatory Visit (INDEPENDENT_AMBULATORY_CARE_PROVIDER_SITE_OTHER): Payer: Medicare Other | Admitting: Critical Care Medicine

## 2014-10-29 VITALS — BP 102/62 | HR 76 | Temp 96.6°F | Ht 67.0 in | Wt 202.8 lb

## 2014-10-29 DIAGNOSIS — J449 Chronic obstructive pulmonary disease, unspecified: Secondary | ICD-10-CM | POA: Diagnosis not present

## 2014-10-29 DIAGNOSIS — IMO0001 Reserved for inherently not codable concepts without codable children: Secondary | ICD-10-CM

## 2014-10-29 MED ORDER — FLUTICASONE-SALMETEROL 250-50 MCG/DOSE IN AEPB: 1.0000 | INHALATION_SPRAY | Freq: Two times a day (BID) | RESPIRATORY_TRACT | Status: DC

## 2014-10-29 NOTE — Assessment & Plan Note (Signed)
Gold B Copd stable at present , with chronic bronchitis Plan No change in inhaled or maintenance medications. Return in  6 months

## 2014-10-29 NOTE — Patient Instructions (Signed)
Refills on advair sent No change in medications Return 6 months

## 2014-10-29 NOTE — Progress Notes (Signed)
Subjective:    Patient ID: Cody Gomez, male    DOB: Apr 26, 1931, 80 y.o.   MRN: [id number]  HPI 80 y.o. WM with Copd and reversable components  10/29/2014 Chief Complaint  Patient presents with  . Follow-up    c/o sob -fair,no cough, had been wheezing not now, cp and tightness mid chest last wk.,saw cardiologist (dr. Olin Pia PA) last wk. went to [facility] ca removed from chin since last ov here  Pt had recent episode of chest pain, cardiac eval was neg.  Pt in ED and cardiac w/u neg.  No pain since this time. Notes some chest tightness alone.   Pt denies any significant sore throat, nasal congestion or excess secretions, fever, chills, sweats, unintended weight loss, pleurtic or exertional chest pain, orthopnea PND, or leg swelling Pt denies any increase in rescue therapy over baseline, denies waking up needing it or having any early am or nocturnal exacerbations of coughing/wheezing/or dyspnea. Pt also denies any obvious fluctuation in symptoms with  weather or environmental change or other alleviating or aggravating factors    Review of Systems Constitutional:   No  weight loss, night sweats,  Fevers, chills, fatigue, lassitude. HEENT:   No headaches,  Difficulty swallowing,  Tooth/dental problems,  Sore throat,                No sneezing, itching, ear ache, nasal congestion, post nasal drip,   CV:  No chest pain,  Orthopnea, PND, swelling in lower extremities, anasarca, dizziness, palpitations  GI  No heartburn, indigestion, abdominal pain, nausea, vomiting, diarrhea, change in bowel habits, loss of appetite  Resp: Notes  shortness of breath with exertion not at rest.  Notes excess mucus, notes productive cough,  No non-productive cough,  No coughing up of blood.  Notes change in color of mucus.  No wheezing.  No chest wall deformity  Skin: no rash or lesions.  GU: no dysuria, change in color of urine, no urgency or frequency.  No flank pain.  MS:  No joint pain or  swelling.  No decreased range of motion.  No back pain.  Psych:  No change in mood or affect. No depression or anxiety.  No memory loss.     Objective:   Physical Exam Filed Vitals:   10/29/14 0853  BP: 102/62  Pulse: 76  Temp: 96.6 F (35.9 C)  TempSrc: Oral  Height: 5\' 7"  (1.702 m)  Weight: 202 lb 12.8 oz (91.989 kg)  SpO2: 97%    Gen: Pleasant, well-nourished, in no distress,  normal affect  ENT: No lesions,  mouth clear,  oropharynx clear, no postnasal drip  Neck: No JVD, no TMG, no carotid bruits  Lungs: No use of accessory muscles, no dullness to percussion, distant BS, no wheezes  Cardiovascular: RRR, heart sounds normal, no murmur or gallops, no peripheral edema  Abdomen: soft and NT, no HSM,  BS normal  Musculoskeletal: No deformities, no cyanosis or clubbing  Neuro: alert, non focal  Skin: Warm, no lesions or rashes       Assessment & Plan:   COPD with bronchitis Gold stage B Gold B Copd stable at present , with chronic bronchitis Plan No change in inhaled or maintenance medications. Return in  6 months    Updated Medication List Outpatient Encounter Prescriptions as of 10/29/2014  Medication Sig  . albuterol (PROVENTIL) (2.5 MG/3ML) 0.083% nebulizer solution Take 3 mLs (2.5 mg total) by nebulization every 6 (six) hours as needed for shortness  of breath.  . Albuterol Sulfate (PROAIR RESPICLICK) 993 (90 BASE) MCG/ACT AEPB Inhale 2 puffs into the lungs every 6 (six) hours as needed.  Marland Kitchen atorvastatin (LIPITOR) 10 MG tablet Take 1 tablet by mouth daily.  . carvedilol (COREG) 25 MG tablet TAKE 1 TABLET BY MOUTH TWICE DAILY  . dofetilide (TIKOSYN) 500 MCG capsule Take 1 capsule (500 mcg total) by mouth 2 (two) times daily.  . Fluticasone-Salmeterol (ADVAIR DISKUS) 250-50 MCG/DOSE AEPB Inhale 1 puff into the lungs 2 (two) times daily.  . furosemide (LASIX) 40 MG tablet TAKE 1 TABLET BY MOUTH DAILY  . guaiFENesin (MUCINEX) 600 MG 12 hr tablet Take 600 mg  by mouth at bedtime.  . methimazole (TAPAZOLE) 10 MG tablet Take 10 mg by mouth daily.    Marland Kitchen mexiletine (MEXITIL) 150 MG capsule TAKE ONE CAPSULE BY MOUTH TWICE DAILY  . NITROSTAT 0.4 MG SL tablet Place 1 tablet under the tongue every 5 (five) minutes as needed for chest pain.   Marland Kitchen nystatin-triamcinolone (MYCOLOG II) cream As needed  . potassium chloride SA (K-DUR,KLOR-CON) 20 MEQ tablet TAKE 1 TABLET BY MOUTH TWICE DAILY  . spironolactone (ALDACTONE) 25 MG tablet Take 1 tablet (25 mg total) by mouth daily.  Marland Kitchen warfarin (COUMADIN) 5 MG tablet Take 5 mg by mouth daily. As directed.  . [DISCONTINUED] Fluticasone-Salmeterol (ADVAIR DISKUS) 250-50 MCG/DOSE AEPB Inhale 1 puff into the lungs 2 (two) times daily.

## 2014-11-05 DIAGNOSIS — Z7901 Long term (current) use of anticoagulants: Secondary | ICD-10-CM | POA: Diagnosis not present

## 2014-11-11 DIAGNOSIS — E8989 Other postprocedural endocrine and metabolic complications and disorders: Secondary | ICD-10-CM | POA: Diagnosis not present

## 2014-11-11 DIAGNOSIS — N3091 Cystitis, unspecified with hematuria: Secondary | ICD-10-CM | POA: Diagnosis not present

## 2014-11-11 DIAGNOSIS — R312 Other microscopic hematuria: Secondary | ICD-10-CM | POA: Diagnosis not present

## 2014-11-11 DIAGNOSIS — R5383 Other fatigue: Secondary | ICD-10-CM | POA: Diagnosis not present

## 2014-11-11 DIAGNOSIS — Z7901 Long term (current) use of anticoagulants: Secondary | ICD-10-CM | POA: Diagnosis not present

## 2014-11-11 DIAGNOSIS — C61 Malignant neoplasm of prostate: Secondary | ICD-10-CM | POA: Diagnosis not present

## 2014-11-18 DIAGNOSIS — Z7901 Long term (current) use of anticoagulants: Secondary | ICD-10-CM | POA: Diagnosis not present

## 2014-11-21 ENCOUNTER — Encounter (HOSPITAL_COMMUNITY): Payer: Self-pay | Admitting: Internal Medicine

## 2014-12-02 DIAGNOSIS — J209 Acute bronchitis, unspecified: Secondary | ICD-10-CM | POA: Diagnosis not present

## 2014-12-26 DIAGNOSIS — Z7901 Long term (current) use of anticoagulants: Secondary | ICD-10-CM | POA: Diagnosis not present

## 2014-12-26 DIAGNOSIS — J069 Acute upper respiratory infection, unspecified: Secondary | ICD-10-CM | POA: Diagnosis not present

## 2015-01-07 ENCOUNTER — Encounter: Payer: Self-pay | Admitting: Internal Medicine

## 2015-01-07 DIAGNOSIS — Z7901 Long term (current) use of anticoagulants: Secondary | ICD-10-CM | POA: Diagnosis not present

## 2015-01-08 ENCOUNTER — Ambulatory Visit (INDEPENDENT_AMBULATORY_CARE_PROVIDER_SITE_OTHER): Payer: Medicare Other | Admitting: *Deleted

## 2015-01-08 ENCOUNTER — Encounter: Payer: Self-pay | Admitting: Internal Medicine

## 2015-01-08 DIAGNOSIS — L570 Actinic keratosis: Secondary | ICD-10-CM | POA: Diagnosis not present

## 2015-01-08 DIAGNOSIS — C44319 Basal cell carcinoma of skin of other parts of face: Secondary | ICD-10-CM | POA: Diagnosis not present

## 2015-01-08 DIAGNOSIS — I420 Dilated cardiomyopathy: Secondary | ICD-10-CM

## 2015-01-08 DIAGNOSIS — I429 Cardiomyopathy, unspecified: Secondary | ICD-10-CM | POA: Diagnosis not present

## 2015-01-08 LAB — MDC_IDC_ENUM_SESS_TYPE_REMOTE
Battery Voltage: 3 V
Brady Statistic AP VP Percent: 97.01 %
Brady Statistic AP VS Percent: 1.35 %
Brady Statistic AS VS Percent: 0.09 %
Brady Statistic RV Percent Paced: 98.56 %
HIGH POWER IMPEDANCE MEASURED VALUE: 66 Ohm
HighPow Impedance: 54 Ohm
Lead Channel Impedance Value: 399 Ohm
Lead Channel Impedance Value: 475 Ohm
Lead Channel Impedance Value: 589 Ohm
Lead Channel Pacing Threshold Amplitude: 0.375 V
Lead Channel Pacing Threshold Amplitude: 1.375 V
Lead Channel Pacing Threshold Pulse Width: 0.4 ms
Lead Channel Pacing Threshold Pulse Width: 0.4 ms
Lead Channel Sensing Intrinsic Amplitude: 2.125 mV
Lead Channel Sensing Intrinsic Amplitude: 6.5 mV
Lead Channel Setting Pacing Amplitude: 2 V
Lead Channel Setting Pacing Amplitude: 3 V
Lead Channel Setting Pacing Pulse Width: 0.4 ms
MDC IDC MSMT BATTERY REMAINING LONGEVITY: 70 mo
MDC IDC SESS DTM: 20160127151532
MDC IDC SET LEADCHNL RV SENSING SENSITIVITY: 0.45 mV
MDC IDC SET ZONE DETECTION INTERVAL: 290 ms
MDC IDC SET ZONE DETECTION INTERVAL: 350 ms
MDC IDC SET ZONE DETECTION INTERVAL: 470 ms
MDC IDC STAT BRADY AS VP PERCENT: 1.55 %
MDC IDC STAT BRADY RA PERCENT PACED: 98.36 %
Zone Setting Detection Interval: 250 ms
Zone Setting Detection Interval: 440 ms

## 2015-01-08 NOTE — Progress Notes (Signed)
Remote ICD transmission.   

## 2015-01-20 DIAGNOSIS — B9689 Other specified bacterial agents as the cause of diseases classified elsewhere: Secondary | ICD-10-CM | POA: Diagnosis not present

## 2015-01-20 DIAGNOSIS — R062 Wheezing: Secondary | ICD-10-CM | POA: Diagnosis not present

## 2015-01-20 DIAGNOSIS — J208 Acute bronchitis due to other specified organisms: Secondary | ICD-10-CM | POA: Diagnosis not present

## 2015-01-21 ENCOUNTER — Encounter: Payer: Self-pay | Admitting: Cardiology

## 2015-01-31 ENCOUNTER — Encounter: Payer: Self-pay | Admitting: Internal Medicine

## 2015-01-31 ENCOUNTER — Ambulatory Visit (INDEPENDENT_AMBULATORY_CARE_PROVIDER_SITE_OTHER): Payer: Medicare Other | Admitting: Internal Medicine

## 2015-01-31 VITALS — BP 116/70 | HR 80 | Ht 67.0 in | Wt 208.4 lb

## 2015-01-31 DIAGNOSIS — I480 Paroxysmal atrial fibrillation: Secondary | ICD-10-CM | POA: Diagnosis not present

## 2015-01-31 DIAGNOSIS — I428 Other cardiomyopathies: Secondary | ICD-10-CM

## 2015-01-31 DIAGNOSIS — I429 Cardiomyopathy, unspecified: Secondary | ICD-10-CM

## 2015-01-31 DIAGNOSIS — Z4502 Encounter for adjustment and management of automatic implantable cardiac defibrillator: Secondary | ICD-10-CM | POA: Diagnosis not present

## 2015-01-31 DIAGNOSIS — I472 Ventricular tachycardia, unspecified: Secondary | ICD-10-CM

## 2015-01-31 LAB — MDC_IDC_ENUM_SESS_TYPE_INCLINIC
Battery Remaining Longevity: 70 mo
Battery Voltage: 2.98 V
Brady Statistic AP VS Percent: 1.05 %
Brady Statistic RA Percent Paced: 97.78 %
Date Time Interrogation Session: 20160219150058
HIGH POWER IMPEDANCE MEASURED VALUE: 304 Ohm
HighPow Impedance: 57 Ohm
HighPow Impedance: 72 Ohm
Lead Channel Impedance Value: 418 Ohm
Lead Channel Pacing Threshold Amplitude: 0.5 V
Lead Channel Pacing Threshold Pulse Width: 0.4 ms
Lead Channel Pacing Threshold Pulse Width: 0.4 ms
Lead Channel Sensing Intrinsic Amplitude: 3.6 mV
Lead Channel Setting Pacing Amplitude: 2 V
Lead Channel Setting Pacing Amplitude: 3.25 V
Lead Channel Setting Pacing Pulse Width: 0.4 ms
Lead Channel Setting Sensing Sensitivity: 0.45 mV
MDC IDC MSMT LEADCHNL RV IMPEDANCE VALUE: 665 Ohm
MDC IDC MSMT LEADCHNL RV PACING THRESHOLD AMPLITUDE: 1.25 V
MDC IDC SET ZONE DETECTION INTERVAL: 250 ms
MDC IDC SET ZONE DETECTION INTERVAL: 290 ms
MDC IDC SET ZONE DETECTION INTERVAL: 470 ms
MDC IDC STAT BRADY AP VP PERCENT: 96.73 %
MDC IDC STAT BRADY AS VP PERCENT: 2.12 %
MDC IDC STAT BRADY AS VS PERCENT: 0.1 %
MDC IDC STAT BRADY RV PERCENT PACED: 98.85 %
Zone Setting Detection Interval: 350 ms
Zone Setting Detection Interval: 440 ms
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
## 2015-01-31 NOTE — Patient Instructions (Addendum)
Your physician recommends that you continue on your current medications as directed. Please refer to the Current Medication list given to you today.  Remote monitoring is used to monitor your Pacemaker of ICD from home. This monitoring reduces the number of office visits required to check your device to one time per year. It allows [place] to keep an eye on the functioning of your device to ensure it is working properly. You are scheduled for a device check from home on 05/05/15. You may send your transmission at any time that day. If you have a wireless device, the transmission will be sent automatically. After your physician reviews your transmission, you will receive a postcard with your next transmission date.  Your physician wants you to follow-up in: 6 months with Dr. [name].  You will receive a reminder letter in the mail two months in advance. If you don't receive a letter, please call our office to schedule the follow-up appointment.  Please have Dr. Lysbeth Galas fax lab results to our office:  Fax # [phone]

## 2015-02-03 DIAGNOSIS — Z7901 Long term (current) use of anticoagulants: Secondary | ICD-10-CM | POA: Diagnosis not present

## 2015-02-13 DIAGNOSIS — J40 Bronchitis, not specified as acute or chronic: Secondary | ICD-10-CM | POA: Diagnosis not present

## 2015-02-24 ENCOUNTER — Other Ambulatory Visit: Payer: Self-pay | Admitting: Internal Medicine

## 2015-03-03 DIAGNOSIS — Z7901 Long term (current) use of anticoagulants: Secondary | ICD-10-CM | POA: Diagnosis not present

## 2015-03-04 ENCOUNTER — Encounter: Payer: Self-pay | Admitting: Internal Medicine

## 2015-03-04 ENCOUNTER — Telehealth: Payer: Self-pay | Admitting: Internal Medicine

## 2015-03-04 ENCOUNTER — Inpatient Hospital Stay (HOSPITAL_COMMUNITY)
Admission: EM | Admit: 2015-03-04 | Discharge: 2015-03-06 | DRG: 309 | Disposition: A | Discharge status: Home / Self Care | Payer: Medicare Other | Attending: Internal Medicine | Admitting: Internal Medicine

## 2015-03-04 ENCOUNTER — Encounter (HOSPITAL_COMMUNITY): Payer: Self-pay

## 2015-03-04 ENCOUNTER — Emergency Department (HOSPITAL_COMMUNITY): Payer: Medicare Other

## 2015-03-04 DIAGNOSIS — Z9049 Acquired absence of other specified parts of digestive tract: Secondary | ICD-10-CM | POA: Diagnosis present

## 2015-03-04 DIAGNOSIS — Z888 Allergy status to other drugs, medicaments and biological substances status: Secondary | ICD-10-CM

## 2015-03-04 DIAGNOSIS — Z9119 Patient's noncompliance with other medical treatment and regimen: Secondary | ICD-10-CM | POA: Diagnosis present

## 2015-03-04 DIAGNOSIS — I420 Dilated cardiomyopathy: Secondary | ICD-10-CM | POA: Diagnosis present

## 2015-03-04 DIAGNOSIS — Z880 Allergy status to penicillin: Secondary | ICD-10-CM | POA: Diagnosis not present

## 2015-03-04 DIAGNOSIS — J45909 Unspecified asthma, uncomplicated: Secondary | ICD-10-CM | POA: Diagnosis present

## 2015-03-04 DIAGNOSIS — I10 Essential (primary) hypertension: Secondary | ICD-10-CM | POA: Diagnosis present

## 2015-03-04 DIAGNOSIS — R42 Dizziness and giddiness: Secondary | ICD-10-CM | POA: Diagnosis not present

## 2015-03-04 DIAGNOSIS — G4733 Obstructive sleep apnea (adult) (pediatric): Secondary | ICD-10-CM | POA: Diagnosis present

## 2015-03-04 DIAGNOSIS — R002 Palpitations: Secondary | ICD-10-CM | POA: Diagnosis not present

## 2015-03-04 DIAGNOSIS — I472 Ventricular tachycardia, unspecified: Secondary | ICD-10-CM

## 2015-03-04 DIAGNOSIS — R079 Chest pain, unspecified: Secondary | ICD-10-CM | POA: Diagnosis not present

## 2015-03-04 DIAGNOSIS — Z95810 Presence of automatic (implantable) cardiac defibrillator: Secondary | ICD-10-CM | POA: Diagnosis not present

## 2015-03-04 DIAGNOSIS — Z8701 Personal history of pneumonia (recurrent): Secondary | ICD-10-CM | POA: Diagnosis not present

## 2015-03-04 DIAGNOSIS — K219 Gastro-esophageal reflux disease without esophagitis: Secondary | ICD-10-CM | POA: Diagnosis present

## 2015-03-04 DIAGNOSIS — R05 Cough: Secondary | ICD-10-CM | POA: Diagnosis not present

## 2015-03-04 DIAGNOSIS — Z87891 Personal history of nicotine dependence: Secondary | ICD-10-CM

## 2015-03-04 DIAGNOSIS — Z8679 Personal history of other diseases of the circulatory system: Secondary | ICD-10-CM

## 2015-03-04 DIAGNOSIS — I480 Paroxysmal atrial fibrillation: Secondary | ICD-10-CM | POA: Diagnosis present

## 2015-03-04 DIAGNOSIS — I428 Other cardiomyopathies: Secondary | ICD-10-CM

## 2015-03-04 DIAGNOSIS — G8929 Other chronic pain: Secondary | ICD-10-CM | POA: Diagnosis present

## 2015-03-04 DIAGNOSIS — Z7901 Long term (current) use of anticoagulants: Secondary | ICD-10-CM | POA: Diagnosis not present

## 2015-03-04 DIAGNOSIS — Z8546 Personal history of malignant neoplasm of prostate: Secondary | ICD-10-CM

## 2015-03-04 DIAGNOSIS — Z85828 Personal history of other malignant neoplasm of skin: Secondary | ICD-10-CM

## 2015-03-04 DIAGNOSIS — M545 Low back pain: Secondary | ICD-10-CM | POA: Diagnosis present

## 2015-03-04 DIAGNOSIS — E785 Hyperlipidemia, unspecified: Secondary | ICD-10-CM | POA: Diagnosis present

## 2015-03-04 DIAGNOSIS — E0590 Thyrotoxicosis, unspecified without thyrotoxic crisis or storm: Secondary | ICD-10-CM | POA: Diagnosis present

## 2015-03-04 DIAGNOSIS — I4729 Other ventricular tachycardia: Secondary | ICD-10-CM

## 2015-03-04 DIAGNOSIS — I429 Cardiomyopathy, unspecified: Secondary | ICD-10-CM | POA: Diagnosis not present

## 2015-03-04 LAB — CBC
HEMATOCRIT: 41.9 % (ref 39.0–52.0)
Hemoglobin: 13.4 g/dL (ref 13.0–17.0)
MCH: 29.7 pg (ref 26.0–34.0)
MCHC: 32 g/dL (ref 30.0–36.0)
MCV: 92.9 fL (ref 78.0–100.0)
PLATELETS: 185 10*3/uL (ref 150–400)
RBC: 4.51 MIL/uL (ref 4.22–5.81)
RDW: 13.8 % (ref 11.5–15.5)
WBC: 6.2 10*3/uL (ref 4.0–10.5)

## 2015-03-04 LAB — I-STAT TROPONIN, ED: TROPONIN I, POC: 0 ng/mL (ref 0.00–0.08)

## 2015-03-04 LAB — PROTIME-INR
INR: 1.71 — ABNORMAL HIGH (ref 0.00–1.49)
Prothrombin Time: 20.2 seconds — ABNORMAL HIGH (ref 11.6–15.2)

## 2015-03-04 LAB — BASIC METABOLIC PANEL
Anion gap: 7 (ref 5–15)
BUN: 16 mg/dL (ref 6–23)
CALCIUM: 9 mg/dL (ref 8.4–10.5)
CHLORIDE: 110 mmol/L (ref 96–112)
CO2: 20 mmol/L (ref 19–32)
Creatinine, Ser: 0.77 mg/dL (ref 0.50–1.35)
GFR calc Af Amer: >90 mL/min (ref >90)
GFR calc non Af Amer: 83 mL/min — ABNORMAL LOW (ref >90)
GLUCOSE: 90 mg/dL (ref 70–99)
Potassium: 4.3 mmol/L (ref 3.5–5.1)
SODIUM: 137 mmol/L (ref 135–145)

## 2015-03-04 LAB — MRSA PCR SCREENING: MRSA BY PCR: NEGATIVE

## 2015-03-04 LAB — MAGNESIUM: Magnesium: 2.2 mg/dL (ref 1.5–2.5)

## 2015-03-04 LAB — TSH: TSH: 1.388 u[IU]/mL (ref 0.350–4.500)

## 2015-03-04 MED ORDER — SODIUM CHLORIDE 0.9 % IJ SOLN
3.0000 mL | Freq: Two times a day (BID) | INTRAMUSCULAR | Status: DC
  Administered 2015-03-04 – 2015-03-06 (×4): 3 mL via INTRAVENOUS

## 2015-03-04 MED ORDER — ONDANSETRON HCL 4 MG/2ML IJ SOLN: 4.0000 mg | Freq: Four times a day (QID) | INTRAMUSCULAR | Status: DC | PRN

## 2015-03-04 MED ORDER — MOMETASONE FURO-FORMOTEROL FUM 100-5 MCG/ACT IN AERO
2.0000 | INHALATION_SPRAY | Freq: Two times a day (BID) | RESPIRATORY_TRACT | Status: DC
  Administered 2015-03-04 – 2015-03-06 (×4): 2 via RESPIRATORY_TRACT
  Filled 2015-03-04 (×2): qty 8.8

## 2015-03-04 MED ORDER — CARVEDILOL 25 MG PO TABS
25.0000 mg | ORAL_TABLET | Freq: Two times a day (BID) | ORAL | Status: DC
  Administered 2015-03-04 – 2015-03-06 (×4): 25 mg via ORAL
  Filled 2015-03-04 (×4): qty 1

## 2015-03-04 MED ORDER — FUROSEMIDE 40 MG PO TABS
40.0000 mg | ORAL_TABLET | Freq: Every day | ORAL | Status: DC
  Administered 2015-03-05 – 2015-03-06 (×2): 40 mg via ORAL
  Filled 2015-03-04 (×2): qty 1

## 2015-03-04 MED ORDER — ATORVASTATIN CALCIUM 10 MG PO TABS
10.0000 mg | ORAL_TABLET | Freq: Every day | ORAL | Status: DC
  Administered 2015-03-04 – 2015-03-06 (×3): 10 mg via ORAL
  Filled 2015-03-04 (×4): qty 1

## 2015-03-04 MED ORDER — NITROGLYCERIN 0.4 MG SL SUBL: 0.4000 mg | SUBLINGUAL_TABLET | SUBLINGUAL | Status: DC | PRN

## 2015-03-04 MED ORDER — MEXILETINE HCL 150 MG PO CAPS
150.0000 mg | ORAL_CAPSULE | Freq: Two times a day (BID) | ORAL | Status: DC
  Administered 2015-03-04 – 2015-03-06 (×4): 150 mg via ORAL
  Filled 2015-03-04 (×6): qty 1

## 2015-03-04 MED ORDER — ALBUTEROL SULFATE (2.5 MG/3ML) 0.083% IN NEBU: 2.5000 mg | INHALATION_SOLUTION | Freq: Four times a day (QID) | RESPIRATORY_TRACT | Status: DC | PRN

## 2015-03-04 MED ORDER — IBUPROFEN 400 MG PO TABS
ORAL_TABLET | ORAL | Status: AC
  Filled 2015-03-04: qty 1

## 2015-03-04 MED ORDER — DOFETILIDE 500 MCG PO CAPS
500.0000 ug | ORAL_CAPSULE | Freq: Two times a day (BID) | ORAL | Status: DC
  Administered 2015-03-04 – 2015-03-06 (×4): 500 ug via ORAL
  Filled 2015-03-04 (×4): qty 1

## 2015-03-04 MED ORDER — GUAIFENESIN ER 600 MG PO TB12
600.0000 mg | ORAL_TABLET | Freq: Every day | ORAL | Status: DC
  Administered 2015-03-04 – 2015-03-05 (×2): 600 mg via ORAL
  Filled 2015-03-04 (×2): qty 1

## 2015-03-04 MED ORDER — SODIUM CHLORIDE 0.9 % IV SOLN: 250.0000 mL | INTRAVENOUS | Status: DC | PRN

## 2015-03-04 MED ORDER — ALBUTEROL SULFATE 108 (90 BASE) MCG/ACT IN AEPB: 2.0000 | INHALATION_SPRAY | Freq: Four times a day (QID) | RESPIRATORY_TRACT | Status: DC | PRN

## 2015-03-04 MED ORDER — METHIMAZOLE 10 MG PO TABS
10.0000 mg | ORAL_TABLET | Freq: Every day | ORAL | Status: DC
Start: 2015-03-05 — End: 2015-03-06
  Administered 2015-03-05 – 2015-03-06 (×2): 10 mg via ORAL
  Filled 2015-03-04 (×3): qty 1

## 2015-03-04 MED ORDER — SODIUM CHLORIDE 0.9 % IJ SOLN: 3.0000 mL | INTRAMUSCULAR | Status: DC | PRN

## 2015-03-04 MED ORDER — ACETAMINOPHEN 325 MG PO TABS: 650.0000 mg | ORAL_TABLET | ORAL | Status: DC | PRN

## 2015-03-04 MED ORDER — POTASSIUM CHLORIDE CRYS ER 20 MEQ PO TBCR
20.0000 meq | EXTENDED_RELEASE_TABLET | Freq: Two times a day (BID) | ORAL | Status: DC
  Administered 2015-03-04 – 2015-03-06 (×4): 20 meq via ORAL
  Filled 2015-03-04 (×4): qty 1

## 2015-03-04 MED ORDER — WARFARIN - PHARMACIST DOSING INPATIENT: Freq: Every day | Status: DC

## 2015-03-04 MED ORDER — WARFARIN SODIUM 3 MG PO TABS
6.0000 mg | ORAL_TABLET | ORAL | Status: AC
  Administered 2015-03-04: 6 mg via ORAL
  Filled 2015-03-04: qty 2

## 2015-03-04 MED ORDER — IBUPROFEN 400 MG PO TABS
400.0000 mg | ORAL_TABLET | Freq: Once | ORAL | Status: AC
  Administered 2015-03-04: 400 mg via ORAL

## 2015-03-04 MED ORDER — SPIRONOLACTONE 25 MG PO TABS
25.0000 mg | ORAL_TABLET | Freq: Every day | ORAL | Status: DC
  Administered 2015-03-05 – 2015-03-06 (×2): 25 mg via ORAL
  Filled 2015-03-04 (×3): qty 1

## 2015-03-04 NOTE — ED Provider Notes (Signed)
Patient referred here from cardiology office for episodes of nonsustained V. tach on his device at home. Patient did have a complaint of feeling in his chest last night that resolved around 11 PM with some associated dizziness. He is currently asymptomatic. Cardiology consult pending for admission for observation for nonsustained ventricular tachycardia.  Quintella Reichert, MD 03/05/15 Benancio Deeds

## 2015-03-04 NOTE — ED Notes (Addendum)
Cardiology at bedside.

## 2015-03-04 NOTE — ED Notes (Signed)
PA at bedside.

## 2015-03-04 NOTE — ED Notes (Addendum)
Pt here for chest pain since last evening. Felt a fluttering and felt a little lightheaded off and on since last night. Has a defib/pacemaker and had a phone call saying it was doing some funny things and was told to come to the ED. Pt also has a slight HA

## 2015-03-04 NOTE — ED Notes (Signed)
MD at bedside. 

## 2015-03-04 NOTE — ED Notes (Signed)
Patients son Xachary Hambly wanted to leave his number in case anyone needed to contact him about patient, [phone] or [phone]
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
## 2015-03-04 NOTE — Telephone Encounter (Signed)
New message      Pt is having "quivering" in his chest since last night.  Should he send a transmission?  He is also dizzy. He is sending a transmission.  Please call after we receive it

## 2015-03-04 NOTE — Telephone Encounter (Addendum)
Patient advised to go to ED to be admitted, per Dr. [name]. Transmission showing multiple episodes of NSVT. Patient agreed to have family member take him to [facility] ED, advised not to drive himself. Trish, cardmaster, notified of patient coming to be evaluated and probably admitted for observation.

## 2015-03-04 NOTE — H&P (Signed)
Patient ID: Cody Gomez MRN: [id number], DOB/AGE: 1931-11-19   Admit date: 03/04/2015   Primary Physician: Carlus Pavlov, MD Primary Cardiologist: Olin Pia, MD   Pt. Profile:  81 y/o male with a h/o NICM and VT s/p ICD placement and VT rfca, who presented to ED today 2/2 palpitations and NSVT noted on device interrogation.  Problem List  Past Medical History  Diagnosis Date  . Nonischemic dilated cardiomyopathy     a. Cath 2008 demonstrated 40% LAD;  b. 01/2014 MV: EF 48%, small inf infarct w/o ischemia->low risk.  . Ventricular tachycardia     a. Shock therapy delivered to the his ICD; b. previous failure to tolerate amiodarone and dronaderone; c. s/p VT ablation April 2015 (3 of 4 VTs successfully ablated);  d. On tikosyn and mexiletene.  Marland Kitchen GERD (gastroesophageal reflux disease)   . HLD (hyperlipidemia)   . HTN (hypertension)   . Atrial fibrillation     a. chronic coumadin   . Automatic implantable cardioverter-defibrillator in situ 10/29/10    a. Medtronic DDBB1D1 Dual Chamber AICD, ser # [id number].  . Skin cancer     "burned off back of my neck; cut off my back"  . Prostate cancer   . Asthma   . Pneumonia     "3 times that I know of; last time ~ 3 yr ago" (03/26/2014)  . OSA on CPAP   . Hyperthyroidism     a. on Methimazole.  . Chronic lower back pain     Past Surgical History  Procedure Laterality Date  . Prostatectomy    . Cardiac defibrillator placement  07/18/2007    MDT ICD implanted with a [id number] RV leald  . Appendectomy    . Implantable cardioverter defibrillator generator change  06/22/13    medtronic Evera XT DR generator change peformed by Dr [name]  . Inguinal hernia repair Right   . Skin cancer excision      "back"  . Ablation  03/26/2014    EPS/RFCA by Dr Rayann Heman - 3 out of 4 VT's successfully ablated  . Lead revision N/A 06/22/2013    Procedure: LEAD REVISION;  Surgeon: Deboraha Sprang, MD;  Location: [facility] CATH LAB;  Service: Cardiovascular;   Laterality: N/A;  . V-tach ablation N/A 03/26/2014    Procedure: V-TACH ABLATION;  Surgeon: Coralyn Mark, MD;  Location: [place] CATH LAB;  Service: Cardiovascular;  Laterality: N/A;     Allergies  Allergies  Allergen Reactions  . Dobutamine Other (See Comments)    Heart stopped  . Penicillins     REACTION: swelling    HPI  81 y/o male with the above complex problem list.  He has a h/o NICM and VT s/p RFCA and ICD placement.  He has been maintained on tikosyn and mexiletine and generally does quite well at home.  He lives by himself and still mows his own yard.  He recently had his tikosyn refilled about 2 wks ago, and he pays $291/month after his insurance pays. This is a burden on him and as a result, over the past 2 wks, he has only been taking his tikosyn once daily instead of q12h as Rx.  Last night, he began to experience intermittent fluttering in his chest associated with mild lightheadedness. Symptoms would typically last about 1-2 minutes and then resolve spontaneously. Symptoms would recur several times per hour. He did not have any ICD shocks nor did he have any syncope. Further, he denies any recent  chest pain, PND, orthopnea, edema, or early satiety. This morning, he continued to have fluttering sensations with presyncope and he called the office. He sent in a remote device transmission and he was noted to be having frequent runs of nonsustained ventricular tachycardia. He was contacted and advised to present to the ED. Here, he has continued to have runs of asymptomatic nonsustained ventricular tachycardia which have lasted up to about 20 seconds. He is otherwise asymptomatic. Labs are within normal limits.  Home Medications  Prior to Admission medications   Medication Sig Start Date End Date Taking? Authorizing Provider  albuterol (PROVENTIL) (2.5 MG/3ML) 0.083% nebulizer solution Take 3 mLs (2.5 mg total) by nebulization every 6 (six) hours as needed for shortness of breath.  06/25/14  Yes Elsie Stain, MD  Albuterol Sulfate (PROAIR RESPICLICK) 845 (90 BASE) MCG/ACT AEPB Inhale 2 puffs into the lungs every 6 (six) hours as needed. 06/25/14  Yes Elsie Stain, MD  atorvastatin (LIPITOR) 10 MG tablet Take 1 tablet by mouth daily. 03/16/13  Yes Historical Provider, MD  carvedilol (COREG) 25 MG tablet TAKE 1 TABLET BY MOUTH TWICE DAILY 02/25/15  Yes Deboraha Sprang, MD  dofetilide (TIKOSYN) 500 MCG capsule Take 1 capsule (500 mcg total) by mouth 2 (two) times daily. 05/02/14  Yes Thompson Grayer, MD  Fluticasone-Salmeterol (ADVAIR DISKUS) 250-50 MCG/DOSE AEPB Inhale 1 puff into the lungs 2 (two) times daily. 10/29/14  Yes Elsie Stain, MD  furosemide (LASIX) 40 MG tablet TAKE 1 TABLET BY MOUTH DAILY 08/23/14  Yes Deboraha Sprang, MD  guaiFENesin (MUCINEX) 600 MG 12 hr tablet Take 600 mg by mouth at bedtime.   Yes Historical Provider, MD  methimazole (TAPAZOLE) 10 MG tablet Take 10 mg by mouth daily.     Yes Historical Provider, MD  mexiletine (MEXITIL) 150 MG capsule TAKE ONE CAPSULE BY MOUTH TWICE DAILY 09/18/14  Yes Deboraha Sprang, MD  nystatin-triamcinolone [facility] II) cream As needed 06/18/14  Yes Historical Provider, MD  potassium chloride SA (K-DUR,KLOR-CON) 20 MEQ tablet TAKE 1 TABLET BY MOUTH TWICE DAILY 08/20/14  Yes Deboraha Sprang, MD  warfarin (COUMADIN) 4 MG tablet Take 4-6 mg by mouth daily. 6 mg on  Monday Wednesday Friday all other days 4 mg. Last INR checked 03-03-15   Yes Historical Provider, MD  NITROSTAT 0.4 MG SL tablet Place 1 tablet under the tongue every 5 (five) minutes as needed for chest pain.  04/13/13   Historical Provider, MD  spironolactone (ALDACTONE) 25 MG tablet Take 1 tablet (25 mg total) by mouth daily. 09/10/13   Deboraha Sprang, MD  warfarin (COUMADIN) 5 MG tablet Take 5 mg by mouth daily. As directed.    Historical Provider, MD    Family History  Family History  Problem Relation Age of Onset  . Heart disease Mother   . Cancer Father      bladder     Social History  History   Social History  . Marital Status: Married    Spouse Name: N/A  . Number of Children: N/A  . Years of Education: N/A   Occupational History  . Not on file.   Social History Main Topics  . Smoking status: Former Smoker -- 2.00 packs/day for 38 years    Types: Cigarettes, Pipe, Cigars    Quit date: 12/13/1982  . Smokeless tobacco: Former Systems developer    Types: [place] date: 12/14/1983     Comment: started when 14; quit  in 1984, up to 2 ppd.   . Alcohol Use: No  . Drug Use: No  . Sexual Activity: Not on file   Other Topics Concern  . Not on file   Social History Narrative   Married, 1 son; retired Administrator.      Review of Systems General:  No chills, fever, night sweats or weight changes.  Cardiovascular:  No chest pain, dyspnea on exertion, edema, orthopnea, palpitations, paroxysmal nocturnal dyspnea. Dermatological: No rash, lesions/masses Respiratory: No cough, dyspnea Urologic: No hematuria, dysuria Abdominal:   No nausea, vomiting, diarrhea, bright red blood per rectum, melena, or hematemesis Neurologic:  No visual changes, wkns, changes in mental status. All other systems reviewed and are otherwise negative except as noted above.  Physical Exam  Blood pressure 114/68, pulse 65, temperature 97.8 F (36.6 C), temperature source Oral, resp. rate 15, height 5\' 7"  (1.702 m), weight 203 lb (92.08 kg), SpO2 98 %.  General: Pleasant, NAD Psych: Normal affect. Neuro: Alert and oriented X 3. Moves all extremities spontaneously. HEENT: Normal  Neck: Supple without bruits or JVD. Lungs:  Resp regular and unlabored, diminished breath sounds on the left. Heart: RRR no s3, s4, or murmurs.  Distant heart sounds. Abdomen: Soft, non-tender, non-distended, BS + x 4.  Extremities: No clubbing, cyanosis or edema. DP/PT/Radials 2+ and equal bilaterally.  Labs  Troponin [facility] of Care Test)  Recent Labs  03/04/15 1603  TROPIPOC 0.00     Lab Results  Component Value Date   WBC 6.2 03/04/2015   HGB 13.4 03/04/2015   HCT 41.9 03/04/2015   MCV 92.9 03/04/2015   PLT 185 03/04/2015    Recent Labs Lab 03/04/15 1546  NA 137  K 4.3  CL 110  CO2 20  BUN 16  CREATININE 0.77  CALCIUM 9.0  GLUCOSE 90   Radiology/Studies  Dg Chest 2 View  03/04/2015   CLINICAL DATA:  81 year old male with dizziness and weakness for 3 days. Chest pain. Initial encounter.  EXAM: CHEST  2 VIEW  COMPARISON:  05/21/2014.  FINDINGS: Stable cardiomegaly and mediastinal contours. Stable lung volumes. Stable left chest cardiac AICD. No pneumothorax, pulmonary edema, pleural effusion or acute pulmonary opacity. Calcified atherosclerosis of the aorta. No acute osseous abnormality identified.  IMPRESSION: No acute cardiopulmonary abnormality.   Electronically Signed   By: Genevie Ann M.D.   On: 03/04/2015 15:58   ECG  AV paced, 73.  ASSESSMENT AND PLAN  1.  NSVT:  Patient presented to the ED in the setting of symptomatic nonsustained ventricular tachycardia as noted on device transmission earlier today. This has occurred in the setting of Tikosyn noncompliance as he is only been taking it once a day over the past 2 weeks secondary to cost. He realizes that this is not an ideal situation but is feeling the strain of $290 a month for one medication. We will admit and resume Tikosyn 500 g every 12 hours. Follow-up ECG after each dose for now. I will ask case management to see him in order to determine if there is some better form of assistance for his Tikosyn. I have looked on the needy meds website and note that Tikosyn is not a drug that is part of their assistance plan. There is a $4 co-pay card. If he is not able to afford Tikosyn, he is interested in an alternate antiarrhythmic. Continue mexiletine and beta blocker.  2.  H/O NICM: EF normalized by echo in 06/2013.  He is euvolemic on  exam and does well @ home without CHF Ss.  Cont bb, spiro.  3. PAF:  In  sinus (AV paced).  Cont bb, tikosyn, coumadin.  Signed, Murray Hodgkins, NP 03/04/2015, 6:53 PM   Attending note:  Patient seen and examined. Reviewed records and discussed the case with Mr. Sharolyn Douglas NP, I agree with his above findings and assessment. He has a history of nonischemic cardiomyopathy with recurrent VT status post VT ablation and ICD placement, subsequent normalization of LVEF but persistent episodes of VT, managed most recently with a combination of mexiletine and Tikosyn. He has generally done well, although within the last 2 weeks decided to cut his Tikosyn back to once daily to conserve the medication related to high cost. He has been experiencing increasing episodes of "fluttering" and mild lightheadedness, was referred to the ER after remote device interrogation found frequent episodes of NSVT, although no device shocks. At the present time he is comfortable on examination, lungs are clear and nonlabored, cardiac exam reveals RRR without gallop, no leg edema noted. Lab work reviewed, POC troponin I 0.00, ECG with dual-chamber and ventricular pacing noted. Chest x-ray shows no pulmonary edema. Plan is admission to the telemetry unit, reinstitution of Tikosyn 500 g every 12 hours, follow-up ECGs, and electrolytes for the morning. Will ask case manager to look into whether there is any assistance program associated with Tikosyn. Dr. [name] will follow-up on the patient tomorrow.  Satira Sark, M.D., F.A.C.C.

## 2015-03-04 NOTE — Progress Notes (Signed)
ANTICOAGULATION CONSULT NOTE - Initial Consult  Pharmacy Consult for Coumadin Indication: atrial fibrillation  Allergies  Allergen Reactions  . Dobutamine Other (See Comments)    Heart stopped  . Penicillins     REACTION: swelling    Patient Measurements: Height: 5\' 7"  (170.2 cm) Weight: 203 lb (92.08 kg) IBW/kg (Calculated) : 66.1  Vital Signs: Temp: 97.8 F (36.6 C) (03/22 1534) Temp Source: Oral (03/22 1534) BP: 133/69 mmHg (03/22 2015) Pulse Rate: 69 (03/22 2015)  Labs:  Recent Labs  03/04/15 1546  HGB 13.4  HCT 41.9  PLT 185  LABPROT 20.2*  INR 1.71*  CREATININE 0.77    Estimated Creatinine Clearance: 78.4 mL/min (by C-G formula based on Cr of 0.77).   Medical History: Past Medical History  Diagnosis Date  . Nonischemic dilated cardiomyopathy     a. Cath 2008 demonstrated 40% LAD;  b. 01/2014 MV: EF 48%, small inf infarct w/o ischemia->low risk.  . Ventricular tachycardia     a. Shock therapy delivered to the his ICD; b. previous failure to tolerate amiodarone and dronaderone; c. s/p VT ablation April 2015 (3 of 4 VTs successfully ablated);  d. On tikosyn and mexiletene.  Marland Kitchen GERD (gastroesophageal reflux disease)   . HLD (hyperlipidemia)   . HTN (hypertension)   . Atrial fibrillation     a. chronic coumadin   . Automatic implantable cardioverter-defibrillator in situ 10/29/10    a. Medtronic DDBB1D1 Dual Chamber AICD, ser # [id number].  . Skin cancer     "burned off back of my neck; cut off my back"  . Prostate cancer   . Asthma   . Pneumonia     "3 times that I know of; last time ~ 3 yr ago" (03/26/2014)  . OSA on CPAP   . Hyperthyroidism     a. on Methimazole.  . Chronic lower back pain     Medications:  Prescriptions prior to admission  Medication Sig Dispense Refill Last Dose  . albuterol (PROVENTIL) (2.5 MG/3ML) 0.083% nebulizer solution Take 3 mLs (2.5 mg total) by nebulization every 6 (six) hours as needed for shortness of breath. 75 mL  6 Past Month at Unknown time  . Albuterol Sulfate (PROAIR RESPICLICK) 774 (90 BASE) MCG/ACT AEPB Inhale 2 puffs into the lungs every 6 (six) hours as needed. 1 each 0 Past Month at Unknown time  . atorvastatin (LIPITOR) 10 MG tablet Take 1 tablet by mouth daily.   03/03/2015 at Unknown time  . carvedilol (COREG) 25 MG tablet TAKE 1 TABLET BY MOUTH TWICE DAILY 180 tablet 0 03/04/2015 at 8a  . dofetilide (TIKOSYN) 500 MCG capsule Take 1 capsule (500 mcg total) by mouth 2 (two) times daily. 60 capsule 11 03/04/2015 at Unknown time  . Fluticasone-Salmeterol (ADVAIR DISKUS) 250-50 MCG/DOSE AEPB Inhale 1 puff into the lungs 2 (two) times daily. 60 each 11 Past Month at Unknown time  . furosemide (LASIX) 40 MG tablet TAKE 1 TABLET BY MOUTH DAILY 30 tablet 3 03/03/2015 at Unknown time  . guaiFENesin (MUCINEX) 600 MG 12 hr tablet Take 600 mg by mouth at bedtime.   03/03/2015 at Unknown time  . methimazole (TAPAZOLE) 10 MG tablet Take 10 mg by mouth daily.     03/04/2015 at Unknown time  . mexiletine (MEXITIL) 150 MG capsule TAKE ONE CAPSULE BY MOUTH TWICE DAILY 60 capsule 8 03/04/2015 at Unknown time  . nystatin-triamcinolone (MYCOLOG II) cream As needed   Past Week at Unknown time  . potassium chloride  SA (K-DUR,KLOR-CON) 20 MEQ tablet TAKE 1 TABLET BY MOUTH TWICE DAILY 180 tablet 1 03/04/2015 at Unknown time  . warfarin (COUMADIN) 4 MG tablet Take 4-6 mg by mouth daily. 6 mg on  Monday Wednesday Friday all other days 4 mg. Last INR checked 03-03-15   03/03/2015 at Unknown time  . NITROSTAT 0.4 MG SL tablet Place 1 tablet under the tongue every 5 (five) minutes as needed for chest pain.    unknown  . spironolactone (ALDACTONE) 25 MG tablet Take 1 tablet (25 mg total) by mouth daily. 90 tablet 3 Taking  . warfarin (COUMADIN) 5 MG tablet Take 5 mg by mouth daily. As directed.   Taking    Assessment: 81 y.o. male presents with palpitations, NSVT noted on device interrogation. Noted pt on Tikosyn but only taking once  daily for past 2 weeks due to cost. Pt on coumadin PTA for afib. Home dose 6mg  on M/W/F and 4mg  all other days. INR 1.71 (subtherapeutic) on admission. Took 6mg  last night. CBC stable.  Goal of Therapy:  INR 2-3 Monitor platelets by anticoagulation protocol: Yes   Plan:  Coumadin 6mg  now Daily INR  Sherlon Handing, PharmD, BCPS Clinical pharmacist, pager [phone] 03/04/2015,9:04 PM

## 2015-03-05 DIAGNOSIS — Z95810 Presence of automatic (implantable) cardiac defibrillator: Secondary | ICD-10-CM

## 2015-03-05 LAB — PROTIME-INR
INR: 1.79 — AB (ref 0.00–1.49)
PROTHROMBIN TIME: 21 s — AB (ref 11.6–15.2)

## 2015-03-05 LAB — COMPREHENSIVE METABOLIC PANEL
ALBUMIN: 3.4 g/dL — AB (ref 3.5–5.2)
ALK PHOS: 68 U/L (ref 39–117)
ALT: 14 U/L (ref 0–53)
AST: 20 U/L (ref 0–37)
Anion gap: 4 — ABNORMAL LOW (ref 5–15)
BILIRUBIN TOTAL: 1.1 mg/dL (ref 0.3–1.2)
BUN: 12 mg/dL (ref 6–23)
CO2: 24 mmol/L (ref 19–32)
Calcium: 8.7 mg/dL (ref 8.4–10.5)
Chloride: 109 mmol/L (ref 96–112)
Creatinine, Ser: 0.76 mg/dL (ref 0.50–1.35)
GFR calc Af Amer: >90 mL/min (ref >90)
GFR, EST NON AFRICAN AMERICAN: 83 mL/min — AB (ref >90)
Glucose, Bld: 84 mg/dL (ref 70–99)
POTASSIUM: 4.4 mmol/L (ref 3.5–5.1)
Sodium: 137 mmol/L (ref 135–145)
Total Protein: 6.3 g/dL (ref 6.0–8.3)

## 2015-03-05 MED ORDER — WARFARIN SODIUM 3 MG PO TABS
6.0000 mg | ORAL_TABLET | Freq: Once | ORAL | Status: AC
  Administered 2015-03-05: 6 mg via ORAL
  Filled 2015-03-05: qty 2

## 2015-03-05 NOTE — Care Management Note (Unsigned)
    Page 1 of 1   03/05/2015     4:33:07 PM CARE MANAGEMENT NOTE 03/05/2015  Patient:  Cody Gomez, Cody Gomez   Account Number:  [id number]  Date Initiated:  03/05/2015  Documentation initiated by:  GRAVES-BIGELOW,Milisa Kimbell  Subjective/Objective Assessment:   Pt admitted for NSVT- was taking Tikosyn once daily instead of twice daily due to cost issues. Pt uses [employer].     Action/Plan:   Pt states co pay is $209.00 and is unable to afford. CM did call [place] in ref to [employer] card and pt unable ot use since has insurance. CM did place patient assistance forms in chart to see if company able to assist.   Anticipated DC Date:  03/07/2015   Anticipated DC Plan:  [place]  CM consult  Medication Assistance      Choice offered to / List presented to:             Status of service:  In process, will continue to follow Medicare Important Message given?   (If response is "NO", the following Medicare IM given date fields will be blank) Date Medicare IM given:   Medicare IM given by:   Date Additional Medicare IM given:   Additional Medicare IM given by:    Discharge Disposition:    Per UR Regulation:  Reviewed for med. necessity/level of care/duration of stay  If discussed at Long Length of Stay Meetings, dates discussed:    Comments:  CM did speak with pt in regards to patient assistance form. Pt may have to much income, however CM will call company in am to see if they have a hardship program that pt can be assisted through. CM will f/u

## 2015-03-05 NOTE — Progress Notes (Signed)
ANTICOAGULATION CONSULT NOTE - Follow Up Consult  Pharmacy Consult for coumadin  Indication: atrial fibrillation  Allergies  Allergen Reactions  . Dobutamine Other (See Comments)    Heart stopped  . Penicillins     REACTION: swelling    Patient Measurements: Height: 5\' 7"  (170.2 cm) Weight: 202 lb 1.6 oz (91.672 kg) IBW/kg (Calculated) : 66.1  Vital Signs: Temp: 97.4 F (36.3 C) (03/23 0800) Temp Source: Oral (03/23 0800) BP: 135/62 mmHg (03/23 0800) Pulse Rate: 84 (03/23 0800)  Labs:  Recent Labs  03/04/15 1546 03/05/15 0309  HGB 13.4  --   HCT 41.9  --   PLT 185  --   LABPROT 20.2* 21.0*  INR 1.71* 1.79*  CREATININE 0.77 0.76    Estimated Creatinine Clearance: 78.2 mL/min (by C-G formula based on Cr of 0.76).   Medications:  Scheduled:  . atorvastatin  10 mg Oral Daily  . carvedilol  25 mg Oral BID  . dofetilide  500 mcg Oral BID  . furosemide  40 mg Oral Daily  . guaiFENesin  600 mg Oral QHS  . methimazole  10 mg Oral Daily  . mexiletine  150 mg Oral BID  . mometasone-formoterol  2 puff Inhalation BID  . potassium chloride SA  20 mEq Oral BID  . sodium chloride  3 mL Intravenous Q12H  . spironolactone  25 mg Oral Daily  . Warfarin - Pharmacist Dosing Inpatient   Does not apply q1800    Assessment: 81 y.o. male presents with palpitations, NSVT noted on device interrogation. Noted pt on Tikosyn but only taking once daily for past 2 weeks due to cost and now on Tikosyn 500mg  bid. Pt on coumadin PTA for afib. Home dose 6mg  on M/W/F and 4mg  all other days.  -INR today is 1.79 and below goal.   Goal of Therapy:  INR 2-3 Monitor platelets by anticoagulation protocol: Yes   Plan:  -Coumadin 6mg  po today -Daily PT/INR  Hildred Laser, Pharm D 03/05/2015 8:47 AM

## 2015-03-05 NOTE — Progress Notes (Signed)
UR Completed Farhana Fellows Graves-Bigelow, RN,BSN [phone]  

## 2015-03-05 NOTE — Progress Notes (Addendum)
Cody Gomez admitted to floor. Shortly after being hooked to monitor started having runs of V-tach. Was told in report he was having them in the ED as well. V-tach in the 140s 15-30 beats. [name] is completely asymptomatic, BP remains stable although he did come in complaining of "quivering" when he first came to ED but says that has subsided. [name] was given his medications which at home he has not been taking everyday and the V-tach has now stopped. He is AV paced.

## 2015-03-05 NOTE — Progress Notes (Signed)
Spoke with Dr. Radford Pax, made aware of prolonged QTC
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
## 2015-03-06 DIAGNOSIS — I429 Cardiomyopathy, unspecified: Secondary | ICD-10-CM

## 2015-03-06 LAB — PROTIME-INR
INR: 2.03 — ABNORMAL HIGH (ref 0.00–1.49)
Prothrombin Time: 23.1 seconds — ABNORMAL HIGH (ref 11.6–15.2)

## 2015-03-06 MED ORDER — WARFARIN SODIUM 4 MG PO TABS: 4.0000 mg | ORAL_TABLET | Freq: Once | ORAL | Status: DC

## 2015-03-06 NOTE — Discharge Summary (Signed)
Electrophysiology Discharge Summary  Patient ID: Cody Gomez MRN: [id number] DOB/AGE: [birth year] 81 y.o.   Primary Cardiologist: Dr. [name]  Admit date: 03/04/2015 Discharge date: 03/06/2015  Admission Diagnoses: Ventricular Tachycardia  Discharge Diagnoses:  Active Problems:   Ventricular tachycardia   Dual implantable cardioverter-defibrillator in situ   Nonischemic cardiomyopathy   Paroxysmal ventricular tachycardia   Automatic implantable cardioverter-defibrillator    Discharged Condition: stable  Hospital Course: The patient is a 81 y/o male, followed by Dr. [name], with a history of NICM and VT s/p ICD placement and VT rfca, previously intolerant of amiodarone now managed with mex and tikosyn. He recently had his tikosyn refilled about 2 wks ago, and he pays $291/month after his insurance pays. This is a burden on him and as a result, over the past 2 wks, he has only been taking his tikosyn once daily instead of q12h as Rx.   On 03/04/15, he presented to the ED  2/2 palpitations. Device interrogation showed prolonged VT below the detection rate as well as multiple episodes of terminated VT. The device was reprogrammed to delay therapy in the first zone and to lower the detection rate in the first zone.  He was admitted and restarted on BID tikosyn dosing. He was monitored and had no QT prolongation. He was also continued on mexiletine and BB. He had no further VT. Case management was consulted for medication assistance. Application for hardship program for lower co-pay was submitted by case manager. On hospital day 2, he was seen and examined by Dr. [name] who determined he was stable for discharge home. He understood that he was to continue 500 mcg of Tikosyn BID. Warfarin was also resumed.   Consults: None  Significant Diagnostic Studies:   Treatments: See Hospital Course  Discharge Exam: Blood pressure 115/53, pulse 71, temperature 97.9 F (36.6 C), temperature source Oral,  resp. rate 18, height 5\' 7"  (1.702 m), weight 181 lb 8 oz (82.328 kg), SpO2 93 %.   Disposition: 01-Home or Self Care      Discharge Instructions    Diet - low sodium heart healthy    Complete by:  As directed      Increase activity slowly    Complete by:  As directed             Medication List    TAKE these medications        albuterol (2.5 MG/3ML) 0.083% nebulizer solution  Commonly known as:  PROVENTIL  Take 3 mLs (2.5 mg total) by nebulization every 6 (six) hours as needed for shortness of breath.     Albuterol Sulfate 108 (90 BASE) MCG/ACT Aepb  Commonly known as:  PROAIR RESPICLICK  Inhale 2 puffs into the lungs every 6 (six) hours as needed.     atorvastatin 10 MG tablet  Commonly known as:  LIPITOR  Take 1 tablet by mouth daily.     carvedilol 25 MG tablet  Commonly known as:  COREG  TAKE 1 TABLET BY MOUTH TWICE DAILY     dofetilide 500 MCG capsule  Commonly known as:  TIKOSYN  Take 1 capsule (500 mcg total) by mouth 2 (two) times daily.     Fluticasone-Salmeterol 250-50 MCG/DOSE Aepb  Commonly known as:  ADVAIR DISKUS  Inhale 1 puff into the lungs 2 (two) times daily.     furosemide 40 MG tablet  Commonly known as:  LASIX  TAKE 1 TABLET BY MOUTH DAILY     guaiFENesin 600 MG 12  hr tablet  Commonly known as:  MUCINEX  Take 600 mg by mouth at bedtime.     methimazole 10 MG tablet  Commonly known as:  TAPAZOLE  Take 10 mg by mouth daily.     mexiletine 150 MG capsule  Commonly known as:  MEXITIL  TAKE ONE CAPSULE BY MOUTH TWICE DAILY     NITROSTAT 0.4 MG SL tablet  Generic drug:  nitroGLYCERIN  Place 1 tablet under the tongue every 5 (five) minutes as needed for chest pain.     nystatin-triamcinolone cream  Commonly known as:  MYCOLOG II  As needed     potassium chloride SA 20 MEQ tablet  Commonly known as:  K-DUR,KLOR-CON  TAKE 1 TABLET BY MOUTH TWICE DAILY     spironolactone 25 MG tablet  Commonly known as:  ALDACTONE  Take 1 tablet  (25 mg total) by mouth daily.     warfarin 5 MG tablet  Commonly known as:  COUMADIN  Take 5 mg by mouth daily. As directed.     warfarin 4 MG tablet  Commonly known as:  COUMADIN  Take 4-6 mg by mouth daily. 6 mg on  Monday Wednesday Friday all other days 4 mg. Last INR checked 03-03-15       TIME SPENT ON DISCHARGE, INCLUDING PHYSICIAN TIME: >30 MINUTES  Signed: Lyda Jester 03/06/2015, 3:44 PM

## 2015-03-06 NOTE — Progress Notes (Addendum)
ANTICOAGULATION CONSULT NOTE - Follow Up Consult  Pharmacy Consult for coumadin  Indication: atrial fibrillation  Allergies  Allergen Reactions  . Dobutamine Other (See Comments)    Heart stopped  . Penicillins     REACTION: swelling    Patient Measurements: Height: 5\' 7"  (170.2 cm) Weight: 181 lb 8 oz (82.328 kg) IBW/kg (Calculated) : 66.1  Vital Signs: Temp: 97.5 F (36.4 C) (03/24 0458) Temp Source: Oral (03/24 0458) BP: 143/72 mmHg (03/24 0813) Pulse Rate: 73 (03/24 0458)  Labs:  Recent Labs  03/04/15 1546 03/05/15 0309 03/06/15 0646  HGB 13.4  --   --   HCT 41.9  --   --   PLT 185  --   --   LABPROT 20.2* 21.0* 23.1*  INR 1.71* 1.79* 2.03*  CREATININE 0.77 0.76  --     Estimated Creatinine Clearance: 74.4 mL/min (by C-G formula based on Cr of 0.76).   Medications:  Scheduled:  . atorvastatin  10 mg Oral Daily  . carvedilol  25 mg Oral BID  . dofetilide  500 mcg Oral BID  . furosemide  40 mg Oral Daily  . guaiFENesin  600 mg Oral QHS  . methimazole  10 mg Oral Daily  . mexiletine  150 mg Oral BID  . mometasone-formoterol  2 puff Inhalation BID  . potassium chloride SA  20 mEq Oral BID  . sodium chloride  3 mL Intravenous Q12H  . spironolactone  25 mg Oral Daily  . Warfarin - Pharmacist Dosing Inpatient   Does not apply q1800    Assessment: 81 y.o. male presents with palpitations, NSVT noted on device interrogation. Noted pt on Tikosyn but only taking once daily for past 2 weeks due to cost and now on Tikosyn 500mg  bid. Pt on coumadin PTA for afib. Home dose 6mg  on M/W/F and 4mg  all other days.  -INR today is 2.03 and at goal.   Goal of Therapy:  INR 2-3 Monitor platelets by anticoagulation protocol: Yes   Plan:  -Coumadin 4mg  po today -If patient goes home would resume home coumadin dose (6mg  on M/W/F and 4mg  all other days) -Daily PT/INR  Hildred Laser, Pharm D 03/06/2015 8:31 AM

## 2015-03-06 NOTE — Progress Notes (Signed)
Patient has met adequate criteria for discharge per MD order. Patient discharge summary was printed and given to patient. All required education, follow-up appointments, new medications and when to notify provider was gone over with patient. All hospital equipment and invasive lines were removed. Patient left the hospital with family and patient belongings in hand.  
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
## 2015-03-11 ENCOUNTER — Other Ambulatory Visit: Payer: Self-pay | Admitting: Internal Medicine

## 2015-03-25 DIAGNOSIS — Z7901 Long term (current) use of anticoagulants: Secondary | ICD-10-CM | POA: Diagnosis not present

## 2015-04-01 DIAGNOSIS — Z7901 Long term (current) use of anticoagulants: Secondary | ICD-10-CM | POA: Diagnosis not present

## 2015-04-01 DIAGNOSIS — R040 Epistaxis: Secondary | ICD-10-CM | POA: Diagnosis not present

## 2015-04-29 DIAGNOSIS — Z7901 Long term (current) use of anticoagulants: Secondary | ICD-10-CM | POA: Diagnosis not present

## 2015-05-05 ENCOUNTER — Ambulatory Visit (INDEPENDENT_AMBULATORY_CARE_PROVIDER_SITE_OTHER): Payer: Medicare Other | Admitting: *Deleted

## 2015-05-05 ENCOUNTER — Encounter: Payer: Self-pay | Admitting: Internal Medicine

## 2015-05-05 DIAGNOSIS — I428 Other cardiomyopathies: Secondary | ICD-10-CM

## 2015-05-05 DIAGNOSIS — I429 Cardiomyopathy, unspecified: Secondary | ICD-10-CM

## 2015-05-05 NOTE — Progress Notes (Signed)
Remote ICD transmission.   

## 2015-05-06 ENCOUNTER — Telehealth: Payer: Self-pay | Admitting: *Deleted

## 2015-05-06 ENCOUNTER — Telehealth: Payer: Self-pay | Admitting: Internal Medicine

## 2015-05-06 DIAGNOSIS — J40 Bronchitis, not specified as acute or chronic: Secondary | ICD-10-CM | POA: Diagnosis not present

## 2015-05-06 LAB — CUP PACEART REMOTE DEVICE CHECK
Battery Remaining Longevity: 68 mo
Battery Voltage: 2.99 V
Battery Voltage: 2.99 V
Brady Statistic AP VP Percent: 96.84 %
Brady Statistic AP VP Percent: 96.84 %
Brady Statistic AP VS Percent: 1.96 %
Brady Statistic AS VS Percent: 0.11 %
Brady Statistic AS VS Percent: 0.11 %
Brady Statistic RA Percent Paced: 98.8 %
Brady Statistic RA Percent Paced: 98.8 %
Brady Statistic RV Percent Paced: 97.93 %
Date Time Interrogation Session: 20160523125935
Date Time Interrogation Session: 20160523125935
HIGH POWER IMPEDANCE MEASURED VALUE: 57 Ohm
HIGH POWER IMPEDANCE MEASURED VALUE: 57 Ohm
HighPow Impedance: 73 Ohm
HighPow Impedance: 73 Ohm
Lead Channel Impedance Value: 418 Ohm
Lead Channel Impedance Value: 418 Ohm
Lead Channel Impedance Value: 589 Ohm
Lead Channel Impedance Value: 418 Ohm
Lead Channel Impedance Value: 418 Ohm
Lead Channel Impedance Value: 589 Ohm
Lead Channel Pacing Threshold Amplitude: 0.5 V
Lead Channel Pacing Threshold Pulse Width: 0.4 ms
Lead Channel Pacing Threshold Pulse Width: 0.4 ms
Lead Channel Pacing Threshold Pulse Width: 0.4 ms
Lead Channel Pacing Threshold Pulse Width: 0.4 ms
Lead Channel Sensing Intrinsic Amplitude: 1.875 mV
Lead Channel Sensing Intrinsic Amplitude: 5.625 mV
Lead Channel Sensing Intrinsic Amplitude: 1.875 mV
Lead Channel Sensing Intrinsic Amplitude: 5.625 mV
Lead Channel Setting Pacing Amplitude: 2 V
Lead Channel Setting Pacing Amplitude: 2.75 V
Lead Channel Setting Pacing Pulse Width: 0.4 ms
Lead Channel Setting Pacing Pulse Width: 0.4 ms
Lead Channel Setting Sensing Sensitivity: 0.45 mV
Lead Channel Setting Sensing Sensitivity: 0.45 mV
MDC IDC MSMT BATTERY REMAINING LONGEVITY: 68 mo
MDC IDC MSMT LEADCHNL RA PACING THRESHOLD AMPLITUDE: 0.5 V
MDC IDC MSMT LEADCHNL RV PACING THRESHOLD AMPLITUDE: 1.375 V
MDC IDC MSMT LEADCHNL RV PACING THRESHOLD AMPLITUDE: 1.375 V
MDC IDC SET LEADCHNL RA PACING AMPLITUDE: 2 V
MDC IDC SET LEADCHNL RV PACING AMPLITUDE: 2.75 V
MDC IDC SET ZONE DETECTION INTERVAL: 250 ms
MDC IDC SET ZONE DETECTION INTERVAL: 350 ms
MDC IDC SET ZONE DETECTION INTERVAL: 350 ms
MDC IDC STAT BRADY AP VS PERCENT: 1.96 %
MDC IDC STAT BRADY AS VP PERCENT: 1.08 %
MDC IDC STAT BRADY AS VP PERCENT: 1.08 %
MDC IDC STAT BRADY RV PERCENT PACED: 97.93 %
Zone Setting Detection Interval: 300 ms
Zone Setting Detection Interval: 470 ms
Zone Setting Detection Interval: 500 ms
Zone Setting Detection Interval: 250 ms
Zone Setting Detection Interval: 300 ms
Zone Setting Detection Interval: 470 ms
Zone Setting Detection Interval: 500 ms

## 2015-05-06 NOTE — Telephone Encounter (Signed)
Spoke with patient- he does not recall symptoms r/t VT episodes in March and May. He reports he has been taking his medications as directed but has been having some breathing difficulty- following up with PCP and pulmonology r/t asthma. Patient made aware of driving restrictions x 6 months. Will call back if changes to be made, if no call- continue as is. Pt verbalizes understanding.

## 2015-05-06 NOTE — Telephone Encounter (Signed)
New Message  Pt states that he received a call about his remote check//sr

## 2015-05-06 NOTE — Telephone Encounter (Signed)
Calling to review episodes on remote transmission sent 05/05/15. No VM on home or mobile #.

## 2015-05-06 NOTE — Telephone Encounter (Signed)
Called pt back- Dr. [name] would like to see him in the office first available- verbalizes understanding. Scheduler made aware and will be calling pt.

## 2015-05-08 ENCOUNTER — Ambulatory Visit (INDEPENDENT_AMBULATORY_CARE_PROVIDER_SITE_OTHER): Payer: Medicare Other | Admitting: Internal Medicine

## 2015-05-08 ENCOUNTER — Encounter: Payer: Self-pay | Admitting: Internal Medicine

## 2015-05-08 VITALS — BP 130/80 | HR 70 | Ht 67.0 in | Wt 205.0 lb

## 2015-05-08 DIAGNOSIS — Z95810 Presence of automatic (implantable) cardiac defibrillator: Secondary | ICD-10-CM

## 2015-05-08 DIAGNOSIS — I472 Ventricular tachycardia: Secondary | ICD-10-CM

## 2015-05-08 DIAGNOSIS — I480 Paroxysmal atrial fibrillation: Secondary | ICD-10-CM | POA: Diagnosis not present

## 2015-05-08 DIAGNOSIS — I428 Other cardiomyopathies: Secondary | ICD-10-CM

## 2015-05-08 DIAGNOSIS — T82110A Breakdown (mechanical) of cardiac electrode, initial encounter: Secondary | ICD-10-CM

## 2015-05-08 DIAGNOSIS — I429 Cardiomyopathy, unspecified: Secondary | ICD-10-CM

## 2015-05-08 DIAGNOSIS — I4729 Other ventricular tachycardia: Secondary | ICD-10-CM

## 2015-05-08 HISTORY — DX: Breakdown (mechanical) of cardiac electrode, initial encounter: T82.110A

## 2015-05-08 LAB — CUP PACEART INCLINIC DEVICE CHECK
Battery Remaining Longevity: 67 mo
Battery Voltage: 2.99 V
Brady Statistic AP VP Percent: 96.84 %
Brady Statistic AP VS Percent: 1.96 %
Brady Statistic AS VP Percent: 1.08 %
Brady Statistic AS VS Percent: 0.11 %
Brady Statistic RA Percent Paced: 98.8 %
Brady Statistic RV Percent Paced: 97.93 %
Date Time Interrogation Session: 20160526172256
HighPow Impedance: 285 Ohm
HighPow Impedance: 57 Ohm
HighPow Impedance: 73 Ohm
Lead Channel Impedance Value: 418 Ohm
Lead Channel Impedance Value: 646 Ohm
Lead Channel Pacing Threshold Amplitude: 0.5 V
Lead Channel Pacing Threshold Amplitude: 1 V
Lead Channel Pacing Threshold Pulse Width: 0.4 ms
Lead Channel Pacing Threshold Pulse Width: 0.4 ms
Lead Channel Sensing Intrinsic Amplitude: 2.9 mV
Lead Channel Setting Pacing Amplitude: 2 V
Lead Channel Setting Pacing Amplitude: 3 V
Lead Channel Setting Pacing Pulse Width: 0.4 ms
Lead Channel Setting Sensing Sensitivity: 0.6 mV
Zone Setting Detection Interval: 250 ms
Zone Setting Detection Interval: 300 ms
Zone Setting Detection Interval: 350 ms
Zone Setting Detection Interval: 470 ms
Zone Setting Detection Interval: 500 ms
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
## 2015-05-08 NOTE — Patient Instructions (Signed)
Medication Instructions:  Your physician recommends that you continue on your current medications as directed. Please refer to the Current Medication list given to you today.  Labwork: None ordered  Testing/Procedures: None ordered  Follow-Up: Keep scheduled follow up  Any Other Special Instructions Will Be Listed Below (If Applicable). Weekly remote device checks on 6/02, 6/09, and 6/16

## 2015-05-13 DIAGNOSIS — N3090 Cystitis, unspecified without hematuria: Secondary | ICD-10-CM | POA: Diagnosis not present

## 2015-05-13 DIAGNOSIS — C61 Malignant neoplasm of prostate: Secondary | ICD-10-CM | POA: Diagnosis not present

## 2015-05-13 DIAGNOSIS — Z7901 Long term (current) use of anticoagulants: Secondary | ICD-10-CM | POA: Diagnosis not present

## 2015-05-13 DIAGNOSIS — N3946 Mixed incontinence: Secondary | ICD-10-CM | POA: Diagnosis not present

## 2015-05-15 ENCOUNTER — Ambulatory Visit (INDEPENDENT_AMBULATORY_CARE_PROVIDER_SITE_OTHER): Payer: Medicare Other | Admitting: *Deleted

## 2015-05-15 ENCOUNTER — Telehealth: Payer: Self-pay | Admitting: Cardiology

## 2015-05-15 DIAGNOSIS — Z95810 Presence of automatic (implantable) cardiac defibrillator: Secondary | ICD-10-CM

## 2015-05-15 NOTE — Telephone Encounter (Signed)
Spoke with pt and reminded pt of remote transmission that is due today. Pt verbalized understanding.   

## 2015-05-15 NOTE — Progress Notes (Signed)
Remote ICD transmission.   

## 2015-05-17 LAB — CUP PACEART REMOTE DEVICE CHECK
Battery Remaining Longevity: 74 mo
Brady Statistic AP VP Percent: 94.29 %
Brady Statistic AP VS Percent: 3.42 %
Brady Statistic RA Percent Paced: 97.71 %
Brady Statistic RV Percent Paced: 96.26 %
HIGH POWER IMPEDANCE MEASURED VALUE: 59 Ohm
HIGH POWER IMPEDANCE MEASURED VALUE: 72 Ohm
Lead Channel Impedance Value: 418 Ohm
Lead Channel Impedance Value: 589 Ohm
Lead Channel Pacing Threshold Amplitude: 0.5 V
Lead Channel Pacing Threshold Amplitude: 1.125 V
Lead Channel Sensing Intrinsic Amplitude: 2.625 mV
Lead Channel Setting Pacing Amplitude: 2 V
Lead Channel Setting Sensing Sensitivity: 0.6 mV
MDC IDC MSMT BATTERY VOLTAGE: 2.99 V
MDC IDC MSMT LEADCHNL RA IMPEDANCE VALUE: 399 Ohm
MDC IDC MSMT LEADCHNL RA PACING THRESHOLD PULSEWIDTH: 0.4 ms
MDC IDC MSMT LEADCHNL RA SENSING INTR AMPL: 2.625 mV
MDC IDC MSMT LEADCHNL RV PACING THRESHOLD PULSEWIDTH: 0.4 ms
MDC IDC MSMT LEADCHNL RV SENSING INTR AMPL: 7.75 mV
MDC IDC MSMT LEADCHNL RV SENSING INTR AMPL: 7.75 mV
MDC IDC SESS DTM: 20160602163218
MDC IDC SET LEADCHNL RV PACING AMPLITUDE: 2.5 V
MDC IDC SET LEADCHNL RV PACING PULSEWIDTH: 0.4 ms
MDC IDC SET ZONE DETECTION INTERVAL: 300 ms
MDC IDC SET ZONE DETECTION INTERVAL: 470 ms
MDC IDC SET ZONE DETECTION INTERVAL: 500 ms
MDC IDC STAT BRADY AS VP PERCENT: 1.97 %
MDC IDC STAT BRADY AS VS PERCENT: 0.32 %
Zone Setting Detection Interval: 250 ms
Zone Setting Detection Interval: 350 ms

## 2015-05-19 ENCOUNTER — Other Ambulatory Visit: Payer: Self-pay | Admitting: Internal Medicine

## 2015-05-20 DIAGNOSIS — M9901 Segmental and somatic dysfunction of cervical region: Secondary | ICD-10-CM | POA: Diagnosis not present

## 2015-05-20 DIAGNOSIS — S134XXA Sprain of ligaments of cervical spine, initial encounter: Secondary | ICD-10-CM | POA: Diagnosis not present

## 2015-05-20 NOTE — Telephone Encounter (Signed)
Per note 5.26.16 

## 2015-05-21 DIAGNOSIS — S134XXA Sprain of ligaments of cervical spine, initial encounter: Secondary | ICD-10-CM | POA: Diagnosis not present

## 2015-05-21 DIAGNOSIS — M9901 Segmental and somatic dysfunction of cervical region: Secondary | ICD-10-CM | POA: Diagnosis not present

## 2015-05-22 ENCOUNTER — Ambulatory Visit (INDEPENDENT_AMBULATORY_CARE_PROVIDER_SITE_OTHER): Payer: Medicare Other | Admitting: *Deleted

## 2015-05-22 DIAGNOSIS — S134XXA Sprain of ligaments of cervical spine, initial encounter: Secondary | ICD-10-CM | POA: Diagnosis not present

## 2015-05-22 DIAGNOSIS — M9901 Segmental and somatic dysfunction of cervical region: Secondary | ICD-10-CM | POA: Diagnosis not present

## 2015-05-22 DIAGNOSIS — Z95810 Presence of automatic (implantable) cardiac defibrillator: Secondary | ICD-10-CM

## 2015-05-22 NOTE — Progress Notes (Signed)
Remote ICD transmission.   

## 2015-05-23 LAB — CUP PACEART REMOTE DEVICE CHECK
Battery Remaining Longevity: 67 mo
Battery Voltage: 2.99 V
Brady Statistic AP VS Percent: 4.66 %
Brady Statistic AS VP Percent: 1.74 %
Brady Statistic AS VS Percent: 0.26 %
Date Time Interrogation Session: 20160609124443
HighPow Impedance: 59 Ohm
HighPow Impedance: 70 Ohm
Lead Channel Impedance Value: 475 Ohm
Lead Channel Pacing Threshold Amplitude: 1.125 V
Lead Channel Pacing Threshold Pulse Width: 0.4 ms
Lead Channel Pacing Threshold Pulse Width: 0.4 ms
Lead Channel Sensing Intrinsic Amplitude: 2.5 mV
Lead Channel Sensing Intrinsic Amplitude: 2.5 mV
Lead Channel Setting Pacing Amplitude: 2 V
Lead Channel Setting Pacing Amplitude: 2.75 V
Lead Channel Setting Pacing Pulse Width: 0.4 ms
Lead Channel Setting Sensing Sensitivity: 0.6 mV
MDC IDC MSMT LEADCHNL RA IMPEDANCE VALUE: 399 Ohm
MDC IDC MSMT LEADCHNL RA PACING THRESHOLD AMPLITUDE: 0.5 V
MDC IDC MSMT LEADCHNL RV IMPEDANCE VALUE: 589 Ohm
MDC IDC MSMT LEADCHNL RV SENSING INTR AMPL: 9.75 mV
MDC IDC MSMT LEADCHNL RV SENSING INTR AMPL: 9.75 mV
MDC IDC SET ZONE DETECTION INTERVAL: 250 ms
MDC IDC SET ZONE DETECTION INTERVAL: 350 ms
MDC IDC SET ZONE DETECTION INTERVAL: 500 ms
MDC IDC STAT BRADY AP VP PERCENT: 93.34 %
MDC IDC STAT BRADY RA PERCENT PACED: 98 %
MDC IDC STAT BRADY RV PERCENT PACED: 95.08 %
Zone Setting Detection Interval: 300 ms
Zone Setting Detection Interval: 470 ms

## 2015-05-26 DIAGNOSIS — S134XXA Sprain of ligaments of cervical spine, initial encounter: Secondary | ICD-10-CM | POA: Diagnosis not present

## 2015-05-26 DIAGNOSIS — M9901 Segmental and somatic dysfunction of cervical region: Secondary | ICD-10-CM | POA: Diagnosis not present

## 2015-05-27 ENCOUNTER — Telehealth: Payer: Self-pay | Admitting: Internal Medicine

## 2015-05-27 DIAGNOSIS — S134XXA Sprain of ligaments of cervical spine, initial encounter: Secondary | ICD-10-CM | POA: Diagnosis not present

## 2015-05-27 DIAGNOSIS — M9901 Segmental and somatic dysfunction of cervical region: Secondary | ICD-10-CM | POA: Diagnosis not present

## 2015-05-27 NOTE — Telephone Encounter (Signed)
New message       Pt states he is having trouble with his monitor.  It is causing him to have telephone problems.  The telephone man said it is not his phone.  Pt unplugged the monitor so that he can dial out.  Please call

## 2015-05-27 NOTE — Telephone Encounter (Signed)
Informed pt that since he has received his wirex he does not need to have his home monitor contacted to his phone any more. Pt verbalized understanding.

## 2015-05-29 ENCOUNTER — Ambulatory Visit: Payer: Medicare Other | Admitting: *Deleted

## 2015-05-29 ENCOUNTER — Encounter: Payer: Self-pay | Admitting: Internal Medicine

## 2015-05-29 ENCOUNTER — Telehealth: Payer: Self-pay | Admitting: Cardiology

## 2015-05-29 DIAGNOSIS — S134XXA Sprain of ligaments of cervical spine, initial encounter: Secondary | ICD-10-CM | POA: Diagnosis not present

## 2015-05-29 DIAGNOSIS — M9901 Segmental and somatic dysfunction of cervical region: Secondary | ICD-10-CM | POA: Diagnosis not present

## 2015-05-29 NOTE — Telephone Encounter (Signed)
Spoke with pt and reminded pt of remote transmission that is due today. Pt verbalized understanding.   

## 2015-06-02 ENCOUNTER — Other Ambulatory Visit: Payer: Self-pay | Admitting: Internal Medicine

## 2015-06-02 DIAGNOSIS — S134XXA Sprain of ligaments of cervical spine, initial encounter: Secondary | ICD-10-CM | POA: Diagnosis not present

## 2015-06-02 DIAGNOSIS — M9901 Segmental and somatic dysfunction of cervical region: Secondary | ICD-10-CM | POA: Diagnosis not present

## 2015-06-02 LAB — CUP PACEART REMOTE DEVICE CHECK
Battery Remaining Longevity: 76 mo
Battery Voltage: 2.99 V
Brady Statistic AP VS Percent: 4.82 %
Brady Statistic AS VP Percent: 1.18 %
Brady Statistic AS VS Percent: 0.16 %
Brady Statistic RA Percent Paced: 98.66 %
Date Time Interrogation Session: 20160616170542
HighPow Impedance: 60 Ohm
HighPow Impedance: 75 Ohm
Lead Channel Impedance Value: 646 Ohm
Lead Channel Pacing Threshold Amplitude: 1.25 V
Lead Channel Pacing Threshold Pulse Width: 0.4 ms
Lead Channel Pacing Threshold Pulse Width: 0.4 ms
Lead Channel Sensing Intrinsic Amplitude: 2.625 mV
Lead Channel Sensing Intrinsic Amplitude: 2.625 mV
Lead Channel Sensing Intrinsic Amplitude: 8.625 mV
Lead Channel Setting Pacing Amplitude: 2 V
Lead Channel Setting Sensing Sensitivity: 0.6 mV
MDC IDC MSMT LEADCHNL RA IMPEDANCE VALUE: 418 Ohm
MDC IDC MSMT LEADCHNL RA PACING THRESHOLD AMPLITUDE: 0.5 V
MDC IDC MSMT LEADCHNL RV IMPEDANCE VALUE: 418 Ohm
MDC IDC MSMT LEADCHNL RV SENSING INTR AMPL: 8.625 mV
MDC IDC SET LEADCHNL RV PACING AMPLITUDE: 2.5 V
MDC IDC SET LEADCHNL RV PACING PULSEWIDTH: 0.4 ms
MDC IDC SET ZONE DETECTION INTERVAL: 250 ms
MDC IDC SET ZONE DETECTION INTERVAL: 350 ms
MDC IDC SET ZONE DETECTION INTERVAL: 500 ms
MDC IDC STAT BRADY AP VP PERCENT: 93.84 %
MDC IDC STAT BRADY RV PERCENT PACED: 95.02 %
Zone Setting Detection Interval: 300 ms
Zone Setting Detection Interval: 470 ms

## 2015-06-03 DIAGNOSIS — M9901 Segmental and somatic dysfunction of cervical region: Secondary | ICD-10-CM | POA: Diagnosis not present

## 2015-06-03 DIAGNOSIS — S134XXA Sprain of ligaments of cervical spine, initial encounter: Secondary | ICD-10-CM | POA: Diagnosis not present

## 2015-06-04 ENCOUNTER — Encounter: Payer: Self-pay | Admitting: Cardiology

## 2015-06-05 DIAGNOSIS — S134XXA Sprain of ligaments of cervical spine, initial encounter: Secondary | ICD-10-CM | POA: Diagnosis not present

## 2015-06-05 DIAGNOSIS — M9901 Segmental and somatic dysfunction of cervical region: Secondary | ICD-10-CM | POA: Diagnosis not present

## 2015-06-09 ENCOUNTER — Encounter: Payer: Self-pay | Admitting: Internal Medicine

## 2015-06-09 DIAGNOSIS — S134XXA Sprain of ligaments of cervical spine, initial encounter: Secondary | ICD-10-CM | POA: Diagnosis not present

## 2015-06-09 DIAGNOSIS — M9901 Segmental and somatic dysfunction of cervical region: Secondary | ICD-10-CM | POA: Diagnosis not present

## 2015-06-10 DIAGNOSIS — M9901 Segmental and somatic dysfunction of cervical region: Secondary | ICD-10-CM | POA: Diagnosis not present

## 2015-06-10 DIAGNOSIS — S134XXA Sprain of ligaments of cervical spine, initial encounter: Secondary | ICD-10-CM | POA: Diagnosis not present

## 2015-06-12 DIAGNOSIS — Z7901 Long term (current) use of anticoagulants: Secondary | ICD-10-CM | POA: Diagnosis not present

## 2015-06-12 DIAGNOSIS — M9901 Segmental and somatic dysfunction of cervical region: Secondary | ICD-10-CM | POA: Diagnosis not present

## 2015-06-12 DIAGNOSIS — S134XXA Sprain of ligaments of cervical spine, initial encounter: Secondary | ICD-10-CM | POA: Diagnosis not present

## 2015-06-17 DIAGNOSIS — S134XXA Sprain of ligaments of cervical spine, initial encounter: Secondary | ICD-10-CM | POA: Diagnosis not present

## 2015-06-17 DIAGNOSIS — M9901 Segmental and somatic dysfunction of cervical region: Secondary | ICD-10-CM | POA: Diagnosis not present

## 2015-06-19 DIAGNOSIS — S134XXA Sprain of ligaments of cervical spine, initial encounter: Secondary | ICD-10-CM | POA: Diagnosis not present

## 2015-06-19 DIAGNOSIS — M9901 Segmental and somatic dysfunction of cervical region: Secondary | ICD-10-CM | POA: Diagnosis not present

## 2015-06-20 ENCOUNTER — Encounter: Payer: Self-pay | Admitting: Cardiology

## 2015-06-23 ENCOUNTER — Encounter: Payer: Self-pay | Admitting: Internal Medicine

## 2015-06-23 DIAGNOSIS — Z7901 Long term (current) use of anticoagulants: Secondary | ICD-10-CM | POA: Diagnosis not present

## 2015-06-23 DIAGNOSIS — M9901 Segmental and somatic dysfunction of cervical region: Secondary | ICD-10-CM | POA: Diagnosis not present

## 2015-06-23 DIAGNOSIS — S134XXA Sprain of ligaments of cervical spine, initial encounter: Secondary | ICD-10-CM | POA: Diagnosis not present

## 2015-06-26 DIAGNOSIS — S134XXA Sprain of ligaments of cervical spine, initial encounter: Secondary | ICD-10-CM | POA: Diagnosis not present

## 2015-06-26 DIAGNOSIS — M9901 Segmental and somatic dysfunction of cervical region: Secondary | ICD-10-CM | POA: Diagnosis not present

## 2015-06-30 DIAGNOSIS — S134XXA Sprain of ligaments of cervical spine, initial encounter: Secondary | ICD-10-CM | POA: Diagnosis not present

## 2015-06-30 DIAGNOSIS — M9901 Segmental and somatic dysfunction of cervical region: Secondary | ICD-10-CM | POA: Diagnosis not present

## 2015-07-01 ENCOUNTER — Other Ambulatory Visit: Payer: Self-pay | Admitting: Internal Medicine

## 2015-07-03 DIAGNOSIS — S134XXA Sprain of ligaments of cervical spine, initial encounter: Secondary | ICD-10-CM | POA: Diagnosis not present

## 2015-07-03 DIAGNOSIS — M9901 Segmental and somatic dysfunction of cervical region: Secondary | ICD-10-CM | POA: Diagnosis not present

## 2015-07-10 DIAGNOSIS — S134XXA Sprain of ligaments of cervical spine, initial encounter: Secondary | ICD-10-CM | POA: Diagnosis not present

## 2015-07-10 DIAGNOSIS — M9901 Segmental and somatic dysfunction of cervical region: Secondary | ICD-10-CM | POA: Diagnosis not present

## 2015-07-15 DIAGNOSIS — S134XXA Sprain of ligaments of cervical spine, initial encounter: Secondary | ICD-10-CM | POA: Diagnosis not present

## 2015-07-15 DIAGNOSIS — M9901 Segmental and somatic dysfunction of cervical region: Secondary | ICD-10-CM | POA: Diagnosis not present

## 2015-07-17 DIAGNOSIS — M9901 Segmental and somatic dysfunction of cervical region: Secondary | ICD-10-CM | POA: Diagnosis not present

## 2015-07-17 DIAGNOSIS — S134XXA Sprain of ligaments of cervical spine, initial encounter: Secondary | ICD-10-CM | POA: Diagnosis not present

## 2015-07-22 DIAGNOSIS — Z7901 Long term (current) use of anticoagulants: Secondary | ICD-10-CM | POA: Diagnosis not present

## 2015-08-07 ENCOUNTER — Encounter: Payer: Self-pay | Admitting: Internal Medicine

## 2015-08-07 ENCOUNTER — Ambulatory Visit (INDEPENDENT_AMBULATORY_CARE_PROVIDER_SITE_OTHER): Payer: Medicare Other | Admitting: *Deleted

## 2015-08-07 DIAGNOSIS — I428 Other cardiomyopathies: Secondary | ICD-10-CM

## 2015-08-07 DIAGNOSIS — I429 Cardiomyopathy, unspecified: Secondary | ICD-10-CM | POA: Diagnosis not present

## 2015-08-07 NOTE — Progress Notes (Signed)
Remote ICD transmission.   

## 2015-08-19 DIAGNOSIS — J45901 Unspecified asthma with (acute) exacerbation: Secondary | ICD-10-CM | POA: Diagnosis not present

## 2015-08-19 LAB — CUP PACEART REMOTE DEVICE CHECK
Brady Statistic AP VP Percent: 98.34 %
Brady Statistic AP VS Percent: 1.17 %
Brady Statistic AS VP Percent: 0.48 %
Brady Statistic AS VS Percent: 0.01 %
Brady Statistic RA Percent Paced: 99.51 %
HighPow Impedance: 60 Ohm
HighPow Impedance: 76 Ohm
Lead Channel Impedance Value: 399 Ohm
Lead Channel Impedance Value: 456 Ohm
Lead Channel Impedance Value: 532 Ohm
Lead Channel Pacing Threshold Amplitude: 1.25 V
Lead Channel Pacing Threshold Pulse Width: 0.4 ms
Lead Channel Sensing Intrinsic Amplitude: 2.25 mV
Lead Channel Sensing Intrinsic Amplitude: 2.25 mV
Lead Channel Sensing Intrinsic Amplitude: 7.75 mV
Lead Channel Sensing Intrinsic Amplitude: 7.75 mV
Lead Channel Setting Pacing Amplitude: 2.5 V
Lead Channel Setting Sensing Sensitivity: 0.6 mV
MDC IDC MSMT BATTERY REMAINING LONGEVITY: 72 mo
MDC IDC MSMT BATTERY VOLTAGE: 2.99 V
MDC IDC MSMT LEADCHNL RA PACING THRESHOLD AMPLITUDE: 0.375 V
MDC IDC MSMT LEADCHNL RA PACING THRESHOLD PULSEWIDTH: 0.4 ms
MDC IDC SESS DTM: 20160825130926
MDC IDC SET LEADCHNL RA PACING AMPLITUDE: 2 V
MDC IDC SET LEADCHNL RV PACING PULSEWIDTH: 0.4 ms
MDC IDC SET ZONE DETECTION INTERVAL: 500 ms
MDC IDC STAT BRADY RV PERCENT PACED: 98.83 %
Zone Setting Detection Interval: 250 ms
Zone Setting Detection Interval: 300 ms
Zone Setting Detection Interval: 350 ms
Zone Setting Detection Interval: 470 ms

## 2015-08-22 ENCOUNTER — Encounter: Payer: Self-pay | Admitting: *Deleted

## 2015-09-01 ENCOUNTER — Other Ambulatory Visit: Payer: Self-pay | Admitting: Internal Medicine

## 2015-09-05 DIAGNOSIS — Z7901 Long term (current) use of anticoagulants: Secondary | ICD-10-CM | POA: Diagnosis not present

## 2015-09-05 DIAGNOSIS — Z23 Encounter for immunization: Secondary | ICD-10-CM | POA: Diagnosis not present

## 2015-09-19 ENCOUNTER — Other Ambulatory Visit: Payer: Self-pay

## 2015-09-19 MED ORDER — POTASSIUM CHLORIDE CRYS ER 20 MEQ PO TBCR: 20.0000 meq | EXTENDED_RELEASE_TABLET | Freq: Two times a day (BID) | ORAL | Status: DC

## 2015-09-19 NOTE — Telephone Encounter (Signed)
Deboraha Sprang, MD at 05/08/2015 4:18 PM  potassium chloride SA (K-DUR,KLOR-CON) 20 MEQ tabletTAKE 1 TABLET BY MOUTH TWICE DAILY The patient has had intercurrent ventricular tachycardia appropriately treated with ATP. We will continue current medications. Patient Instructions     Medication Instructions:  Your physician recommends that you continue on your current medications as directed. Please refer to the Current Medication list given to you today.

## 2015-09-23 DIAGNOSIS — Z7901 Long term (current) use of anticoagulants: Secondary | ICD-10-CM | POA: Diagnosis not present

## 2015-10-07 DIAGNOSIS — Z7901 Long term (current) use of anticoagulants: Secondary | ICD-10-CM | POA: Diagnosis not present

## 2015-10-30 ENCOUNTER — Telehealth: Payer: Self-pay | Admitting: Internal Medicine

## 2015-10-30 DIAGNOSIS — Z7901 Long term (current) use of anticoagulants: Secondary | ICD-10-CM | POA: Diagnosis not present

## 2015-10-30 NOTE — Telephone Encounter (Signed)
New message     Pt want to know why Dr Jackalyn Lombard name is on his presc for tikosyn.  Please call

## 2015-10-30 NOTE — Telephone Encounter (Signed)
Pt is aware that Dr. Rayann Heman is another EP Md working in the same office with Dr. [name]. Pt now states I saw on my papers before you called. Every thing is fine now.

## 2015-10-31 DIAGNOSIS — Z79899 Other long term (current) drug therapy: Secondary | ICD-10-CM | POA: Diagnosis not present

## 2015-10-31 DIAGNOSIS — J441 Chronic obstructive pulmonary disease with (acute) exacerbation: Secondary | ICD-10-CM | POA: Diagnosis not present

## 2015-10-31 DIAGNOSIS — J45901 Unspecified asthma with (acute) exacerbation: Secondary | ICD-10-CM | POA: Diagnosis not present

## 2015-11-03 DIAGNOSIS — Z7901 Long term (current) use of anticoagulants: Secondary | ICD-10-CM | POA: Diagnosis not present

## 2015-11-10 ENCOUNTER — Ambulatory Visit (INDEPENDENT_AMBULATORY_CARE_PROVIDER_SITE_OTHER): Payer: Medicare Other | Admitting: *Deleted

## 2015-11-10 ENCOUNTER — Telehealth: Payer: Self-pay | Admitting: Cardiology

## 2015-11-10 DIAGNOSIS — N3021 Other chronic cystitis with hematuria: Secondary | ICD-10-CM | POA: Diagnosis not present

## 2015-11-10 DIAGNOSIS — C61 Malignant neoplasm of prostate: Secondary | ICD-10-CM | POA: Diagnosis not present

## 2015-11-10 DIAGNOSIS — R311 Benign essential microscopic hematuria: Secondary | ICD-10-CM | POA: Diagnosis not present

## 2015-11-10 DIAGNOSIS — I428 Other cardiomyopathies: Secondary | ICD-10-CM

## 2015-11-10 DIAGNOSIS — I429 Cardiomyopathy, unspecified: Secondary | ICD-10-CM | POA: Diagnosis not present

## 2015-11-10 DIAGNOSIS — N3946 Mixed incontinence: Secondary | ICD-10-CM | POA: Diagnosis not present

## 2015-11-10 NOTE — Telephone Encounter (Signed)
LMOVM reminding pt to send remote transmission.   

## 2015-11-12 NOTE — Progress Notes (Signed)
Remote ICD transmission.   

## 2015-11-17 ENCOUNTER — Telehealth: Payer: Self-pay | Admitting: Internal Medicine

## 2015-11-17 ENCOUNTER — Other Ambulatory Visit: Payer: Self-pay | Admitting: *Deleted

## 2015-11-17 MED ORDER — MEXILETINE HCL 150 MG PO CAPS: 150.0000 mg | ORAL_CAPSULE | Freq: Two times a day (BID) | ORAL | Status: DC

## 2015-11-17 NOTE — Telephone Encounter (Signed)
LMOVM informing pt that his transmission was received on 11-10-15. We are still waiting on the results.

## 2015-11-17 NOTE — Telephone Encounter (Signed)
Rx sent in

## 2015-11-17 NOTE — Telephone Encounter (Signed)
°*  STAT* If patient is at the pharmacy, call can be transferred to refill team.   1. Which medications need to be refilled? (please list name of each medication and dose if known) Mexiletine 150 mg  2. Which pharmacy/location (including street and city if local pharmacy) is medication to be sent to? [facility] [address] in [place]  3. Do they need a 30 day or 90 day supply? 30 Day

## 2015-11-17 NOTE — Telephone Encounter (Signed)
New Message     Pt calling to find out that status of his last remote transmission dn wants to know if we received and how it was. Please call back and advise.

## 2015-11-20 LAB — CUP PACEART REMOTE DEVICE CHECK
Brady Statistic AP VS Percent: 1.76 %
Brady Statistic AS VS Percent: 0.35 %
Brady Statistic RA Percent Paced: 96.13 %
HighPow Impedance: 60 Ohm
HighPow Impedance: 76 Ohm
Implantable Lead Implant Date: 20080805
Implantable Lead Implant Date: 20080805
Implantable Lead Location: 753860
Implantable Lead Model: 4076
Implantable Lead Model: 7001
Lead Channel Impedance Value: 475 Ohm
Lead Channel Pacing Threshold Amplitude: 0.375 V
Lead Channel Pacing Threshold Amplitude: 1.25 V
Lead Channel Pacing Threshold Pulse Width: 0.4 ms
Lead Channel Pacing Threshold Pulse Width: 0.4 ms
Lead Channel Sensing Intrinsic Amplitude: 2.375 mV
Lead Channel Sensing Intrinsic Amplitude: 2.375 mV
Lead Channel Sensing Intrinsic Amplitude: 8.375 mV
Lead Channel Setting Pacing Amplitude: 2 V
Lead Channel Setting Pacing Amplitude: 2.5 V
Lead Channel Setting Sensing Sensitivity: 0.6 mV
MDC IDC LEAD LOCATION: 753859
MDC IDC MSMT BATTERY REMAINING LONGEVITY: 71 mo
MDC IDC MSMT BATTERY VOLTAGE: 2.98 V
MDC IDC MSMT LEADCHNL RA IMPEDANCE VALUE: 418 Ohm
MDC IDC MSMT LEADCHNL RV IMPEDANCE VALUE: 646 Ohm
MDC IDC MSMT LEADCHNL RV SENSING INTR AMPL: 8.375 mV
MDC IDC SESS DTM: 20161128213238
MDC IDC SET LEADCHNL RV PACING PULSEWIDTH: 0.4 ms
MDC IDC STAT BRADY AP VP PERCENT: 94.37 %
MDC IDC STAT BRADY AS VP PERCENT: 3.52 %
MDC IDC STAT BRADY RV PERCENT PACED: 97.89 %

## 2015-11-21 ENCOUNTER — Encounter: Payer: Self-pay | Admitting: Cardiology

## 2015-11-24 DIAGNOSIS — Z7901 Long term (current) use of anticoagulants: Secondary | ICD-10-CM | POA: Diagnosis not present

## 2015-12-19 DIAGNOSIS — J4521 Mild intermittent asthma with (acute) exacerbation: Secondary | ICD-10-CM | POA: Diagnosis not present

## 2015-12-19 DIAGNOSIS — J209 Acute bronchitis, unspecified: Secondary | ICD-10-CM | POA: Diagnosis not present

## 2015-12-29 DIAGNOSIS — Z7901 Long term (current) use of anticoagulants: Secondary | ICD-10-CM | POA: Diagnosis not present

## 2016-01-05 DIAGNOSIS — Z7901 Long term (current) use of anticoagulants: Secondary | ICD-10-CM | POA: Diagnosis not present

## 2016-01-06 DIAGNOSIS — Z125 Encounter for screening for malignant neoplasm of prostate: Secondary | ICD-10-CM | POA: Diagnosis not present

## 2016-01-06 DIAGNOSIS — E785 Hyperlipidemia, unspecified: Secondary | ICD-10-CM | POA: Diagnosis not present

## 2016-01-06 DIAGNOSIS — Z79899 Other long term (current) drug therapy: Secondary | ICD-10-CM | POA: Diagnosis not present

## 2016-01-06 DIAGNOSIS — K219 Gastro-esophageal reflux disease without esophagitis: Secondary | ICD-10-CM | POA: Diagnosis not present

## 2016-01-06 DIAGNOSIS — I482 Chronic atrial fibrillation: Secondary | ICD-10-CM | POA: Diagnosis not present

## 2016-01-06 DIAGNOSIS — Z0000 Encounter for general adult medical examination without abnormal findings: Secondary | ICD-10-CM | POA: Diagnosis not present

## 2016-01-06 DIAGNOSIS — E7800 Pure hypercholesterolemia, unspecified: Secondary | ICD-10-CM | POA: Diagnosis not present

## 2016-01-19 DIAGNOSIS — Z7901 Long term (current) use of anticoagulants: Secondary | ICD-10-CM | POA: Diagnosis not present

## 2016-01-20 ENCOUNTER — Ambulatory Visit (INDEPENDENT_AMBULATORY_CARE_PROVIDER_SITE_OTHER): Payer: Medicare Other | Admitting: Internal Medicine

## 2016-01-20 ENCOUNTER — Encounter: Payer: Self-pay | Admitting: Internal Medicine

## 2016-01-20 VITALS — BP 124/70 | HR 73 | Ht 68.0 in | Wt 181.6 lb

## 2016-01-20 DIAGNOSIS — I472 Ventricular tachycardia, unspecified: Secondary | ICD-10-CM

## 2016-01-20 DIAGNOSIS — I480 Paroxysmal atrial fibrillation: Secondary | ICD-10-CM

## 2016-01-20 DIAGNOSIS — I429 Cardiomyopathy, unspecified: Secondary | ICD-10-CM

## 2016-01-20 DIAGNOSIS — Z95810 Presence of automatic (implantable) cardiac defibrillator: Secondary | ICD-10-CM

## 2016-01-20 DIAGNOSIS — I428 Other cardiomyopathies: Secondary | ICD-10-CM

## 2016-01-20 DIAGNOSIS — I4891 Unspecified atrial fibrillation: Secondary | ICD-10-CM | POA: Diagnosis not present

## 2016-01-20 LAB — CUP PACEART INCLINIC DEVICE CHECK
Battery Remaining Longevity: 70 mo
Brady Statistic AP VP Percent: 93.66 %
Brady Statistic AP VS Percent: 1.96 %
Brady Statistic AS VP Percent: 4.04 %
Brady Statistic AS VS Percent: 0.35 %
Date Time Interrogation Session: 20170207144015
HighPow Impedance: 60 Ohm
HighPow Impedance: 77 Ohm
Implantable Lead Implant Date: 20080805
Implantable Lead Implant Date: 20080805
Implantable Lead Location: 753859
Implantable Lead Model: 4076
Lead Channel Impedance Value: 665 Ohm
Lead Channel Pacing Threshold Amplitude: 1.25 V
Lead Channel Pacing Threshold Pulse Width: 0.4 ms
Lead Channel Pacing Threshold Pulse Width: 0.4 ms
Lead Channel Setting Pacing Amplitude: 2 V
Lead Channel Setting Pacing Amplitude: 2.5 V
Lead Channel Setting Pacing Pulse Width: 0.4 ms
Lead Channel Setting Sensing Sensitivity: 0.6 mV
MDC IDC LEAD LOCATION: 753860
MDC IDC LEAD MODEL: 7001
MDC IDC MSMT BATTERY VOLTAGE: 2.99 V
MDC IDC MSMT LEADCHNL RA IMPEDANCE VALUE: 418 Ohm
MDC IDC MSMT LEADCHNL RA PACING THRESHOLD AMPLITUDE: 0.5 V
MDC IDC MSMT LEADCHNL RA SENSING INTR AMPL: 3.375 mV
MDC IDC MSMT LEADCHNL RV IMPEDANCE VALUE: 475 Ohm
MDC IDC STAT BRADY RA PERCENT PACED: 95.62 %
MDC IDC STAT BRADY RV PERCENT PACED: 97.7 %

## 2016-01-20 MED ORDER — CARVEDILOL 12.5 MG PO TABS: 12.5000 mg | ORAL_TABLET | Freq: Two times a day (BID) | ORAL | Status: DC

## 2016-01-20 NOTE — Patient Instructions (Signed)
Medication Instructions: 1) Re-start Coreg (carvedilol) 12.5 mg one tablet by mouth twice daily ** for the first 2-3 days take 1/2 tablet by mouth twice daily **   Labwork: - Your physician recommends that you return for lab work today: CMET/ TSH/ Magnesium   Procedures/Testing: - none  Follow-Up: - Remote monitoring is used to monitor your Pacemaker of ICD from home. This monitoring reduces the number of office visits required to check your device to one time per year. It allows [place] to keep an eye on the functioning of your device to ensure it is working properly. You are scheduled for a device check from home on 04/20/16. You may send your transmission at any time that day. If you have a wireless device, the transmission will be sent automatically. After your physician reviews your transmission, you will receive a postcard with your next transmission date.  - Your physician wants you to follow-up in: 6 months with Dr. [name]. You will receive a reminder letter in the mail two months in advance. If you don't receive a letter, please call our office to schedule the follow-up appointment.  Any Additional Special Instructions Will Be Listed Below (If Applicable).     If you need a refill on your cardiac medications before your next appointment, please call your pharmacy.
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
## 2016-01-21 LAB — COMPREHENSIVE METABOLIC PANEL
ALBUMIN: 4.1 g/dL (ref 3.6–5.1)
ALT: 14 U/L (ref 9–46)
AST: 16 U/L (ref 10–35)
Alkaline Phosphatase: 59 U/L (ref 40–115)
BILIRUBIN TOTAL: 0.7 mg/dL (ref 0.2–1.2)
BUN: 18 mg/dL (ref 7–25)
CHLORIDE: 106 mmol/L (ref 98–110)
CO2: 24 mmol/L (ref 20–31)
CREATININE: 0.87 mg/dL (ref 0.70–1.11)
Calcium: 9.2 mg/dL (ref 8.6–10.3)
GLUCOSE: 83 mg/dL (ref 65–99)
Potassium: 4.7 mmol/L (ref 3.5–5.3)
SODIUM: 140 mmol/L (ref 135–146)
Total Protein: 7 g/dL (ref 6.1–8.1)

## 2016-01-21 LAB — MAGNESIUM: MAGNESIUM: 2.2 mg/dL (ref 1.5–2.5)

## 2016-01-21 LAB — TSH: TSH: 0.01 mIU/L — ABNORMAL LOW (ref 0.40–4.50)

## 2016-01-22 ENCOUNTER — Telehealth: Payer: Self-pay | Admitting: Internal Medicine

## 2016-01-22 NOTE — Telephone Encounter (Signed)
With his EF ok and K ok no need to use aldactone

## 2016-01-22 NOTE — Telephone Encounter (Signed)
New message      Pt was seen last Tuesday.  Calling to report his medications to the nurse

## 2016-01-22 NOTE — Telephone Encounter (Signed)
I called and spoke with the patient. He gave me his updated medication list- confirming what he had at home. He did not think he was on Coreg when he was in the office on 2/7. Dr. [name] was going to start him on Coreg 12.5 mg BID. Per the patient, he is currently on Coreg 25 mg BID.  He states he has taken this for years and could not remember this in the office the other day. I have advised him to continue coreg 25 mg BID. He did have spironolactone 25 mg daily on his medication list, but he states he does not have this at home. I will review with Dr. [name] to see if he needs to be on this and call him back with recommendations. He is agreeable.

## 2016-01-22 NOTE — Telephone Encounter (Signed)
I left a message for the patient on his voice mail that he does not need to restart spironolactone.

## 2016-01-26 DIAGNOSIS — Z7901 Long term (current) use of anticoagulants: Secondary | ICD-10-CM | POA: Diagnosis not present

## 2016-01-27 DIAGNOSIS — R5381 Other malaise: Secondary | ICD-10-CM | POA: Diagnosis not present

## 2016-01-27 DIAGNOSIS — I482 Chronic atrial fibrillation: Secondary | ICD-10-CM | POA: Diagnosis not present

## 2016-01-27 DIAGNOSIS — E0590 Thyrotoxicosis, unspecified without thyrotoxic crisis or storm: Secondary | ICD-10-CM | POA: Diagnosis not present

## 2016-01-27 DIAGNOSIS — R5383 Other fatigue: Secondary | ICD-10-CM | POA: Diagnosis not present

## 2016-02-02 DIAGNOSIS — Z7901 Long term (current) use of anticoagulants: Secondary | ICD-10-CM | POA: Diagnosis not present

## 2016-02-12 DIAGNOSIS — E069 Thyroiditis, unspecified: Secondary | ICD-10-CM | POA: Diagnosis not present

## 2016-02-12 DIAGNOSIS — E0590 Thyrotoxicosis, unspecified without thyrotoxic crisis or storm: Secondary | ICD-10-CM | POA: Diagnosis not present

## 2016-02-12 DIAGNOSIS — G44209 Tension-type headache, unspecified, not intractable: Secondary | ICD-10-CM | POA: Diagnosis not present

## 2016-02-12 DIAGNOSIS — E049 Nontoxic goiter, unspecified: Secondary | ICD-10-CM | POA: Diagnosis not present

## 2016-02-17 DIAGNOSIS — S93421A Sprain of deltoid ligament of right ankle, initial encounter: Secondary | ICD-10-CM | POA: Diagnosis not present

## 2016-02-18 DIAGNOSIS — E0590 Thyrotoxicosis, unspecified without thyrotoxic crisis or storm: Secondary | ICD-10-CM | POA: Diagnosis not present

## 2016-02-24 DIAGNOSIS — E0590 Thyrotoxicosis, unspecified without thyrotoxic crisis or storm: Secondary | ICD-10-CM | POA: Diagnosis not present

## 2016-02-27 DIAGNOSIS — L723 Sebaceous cyst: Secondary | ICD-10-CM | POA: Diagnosis not present

## 2016-02-27 DIAGNOSIS — Z7901 Long term (current) use of anticoagulants: Secondary | ICD-10-CM | POA: Diagnosis not present

## 2016-02-27 DIAGNOSIS — L03113 Cellulitis of right upper limb: Secondary | ICD-10-CM | POA: Diagnosis not present

## 2016-03-04 DIAGNOSIS — Z7901 Long term (current) use of anticoagulants: Secondary | ICD-10-CM | POA: Diagnosis not present

## 2016-03-30 DIAGNOSIS — I482 Chronic atrial fibrillation: Secondary | ICD-10-CM | POA: Diagnosis not present

## 2016-03-30 DIAGNOSIS — Z7901 Long term (current) use of anticoagulants: Secondary | ICD-10-CM | POA: Diagnosis not present

## 2016-03-30 DIAGNOSIS — J45901 Unspecified asthma with (acute) exacerbation: Secondary | ICD-10-CM | POA: Diagnosis not present

## 2016-03-30 DIAGNOSIS — J441 Chronic obstructive pulmonary disease with (acute) exacerbation: Secondary | ICD-10-CM | POA: Diagnosis not present

## 2016-04-02 DIAGNOSIS — Z7901 Long term (current) use of anticoagulants: Secondary | ICD-10-CM | POA: Diagnosis not present

## 2016-04-09 DIAGNOSIS — Z7901 Long term (current) use of anticoagulants: Secondary | ICD-10-CM | POA: Diagnosis not present

## 2016-04-16 DIAGNOSIS — Z7901 Long term (current) use of anticoagulants: Secondary | ICD-10-CM | POA: Diagnosis not present

## 2016-04-20 ENCOUNTER — Ambulatory Visit (INDEPENDENT_AMBULATORY_CARE_PROVIDER_SITE_OTHER): Payer: Medicare Other | Admitting: *Deleted

## 2016-04-20 DIAGNOSIS — I429 Cardiomyopathy, unspecified: Secondary | ICD-10-CM

## 2016-04-20 DIAGNOSIS — I428 Other cardiomyopathies: Secondary | ICD-10-CM

## 2016-04-21 NOTE — Progress Notes (Signed)
Remote ICD transmission.   

## 2016-04-23 DIAGNOSIS — Z7901 Long term (current) use of anticoagulants: Secondary | ICD-10-CM | POA: Diagnosis not present

## 2016-05-06 DIAGNOSIS — Z7901 Long term (current) use of anticoagulants: Secondary | ICD-10-CM | POA: Diagnosis not present

## 2016-05-10 DIAGNOSIS — N3020 Other chronic cystitis without hematuria: Secondary | ICD-10-CM | POA: Diagnosis not present

## 2016-05-10 DIAGNOSIS — N3946 Mixed incontinence: Secondary | ICD-10-CM | POA: Diagnosis not present

## 2016-05-10 DIAGNOSIS — C61 Malignant neoplasm of prostate: Secondary | ICD-10-CM | POA: Diagnosis not present

## 2016-05-13 ENCOUNTER — Telehealth: Payer: Self-pay | Admitting: Internal Medicine

## 2016-05-13 NOTE — Telephone Encounter (Signed)
New MEssage  Pt stated he has not received results from last remote check. Please call back and discuss.

## 2016-05-13 NOTE — Telephone Encounter (Signed)
Called patient to advise him that his remote transmission was received and that his ICD function was stable.  Patient verbalizes understanding and denies additional questions or concerns at this time.  He is appreciative of call.

## 2016-05-18 ENCOUNTER — Other Ambulatory Visit: Payer: Self-pay | Admitting: Internal Medicine

## 2016-05-20 DIAGNOSIS — E0590 Thyrotoxicosis, unspecified without thyrotoxic crisis or storm: Secondary | ICD-10-CM | POA: Diagnosis not present

## 2016-05-24 ENCOUNTER — Other Ambulatory Visit: Payer: Self-pay | Admitting: Internal Medicine

## 2016-05-25 LAB — CUP PACEART REMOTE DEVICE CHECK
Battery Remaining Longevity: 59 mo
Brady Statistic AP VP Percent: 82.5 %
Brady Statistic AP VS Percent: 1.38 %
Brady Statistic AS VP Percent: 15.46 %
Brady Statistic RV Percent Paced: 97.97 %
Date Time Interrogation Session: 20170509073629
HIGH POWER IMPEDANCE MEASURED VALUE: 73 Ohm
HighPow Impedance: 60 Ohm
Implantable Lead Implant Date: 20080805
Implantable Lead Location: 753859
Implantable Lead Model: 7001
Lead Channel Impedance Value: 418 Ohm
Lead Channel Pacing Threshold Amplitude: 0.375 V
Lead Channel Pacing Threshold Amplitude: 1.375 V
Lead Channel Pacing Threshold Pulse Width: 0.4 ms
Lead Channel Sensing Intrinsic Amplitude: 2.625 mV
Lead Channel Sensing Intrinsic Amplitude: 8.125 mV
Lead Channel Sensing Intrinsic Amplitude: 8.125 mV
Lead Channel Setting Pacing Amplitude: 2 V
Lead Channel Setting Pacing Amplitude: 2.75 V
Lead Channel Setting Pacing Pulse Width: 0.4 ms
MDC IDC LEAD IMPLANT DT: 20080805
MDC IDC LEAD LOCATION: 753860
MDC IDC MSMT BATTERY VOLTAGE: 2.99 V
MDC IDC MSMT LEADCHNL RA PACING THRESHOLD PULSEWIDTH: 0.4 ms
MDC IDC MSMT LEADCHNL RA SENSING INTR AMPL: 2.625 mV
MDC IDC MSMT LEADCHNL RV IMPEDANCE VALUE: 418 Ohm
MDC IDC MSMT LEADCHNL RV IMPEDANCE VALUE: 589 Ohm
MDC IDC SET LEADCHNL RV SENSING SENSITIVITY: 0.6 mV
MDC IDC STAT BRADY AS VS PERCENT: 0.65 %
MDC IDC STAT BRADY RA PERCENT PACED: 83.89 %

## 2016-05-26 ENCOUNTER — Encounter: Payer: Self-pay | Admitting: Cardiology

## 2016-06-08 DIAGNOSIS — R05 Cough: Secondary | ICD-10-CM | POA: Diagnosis not present

## 2016-06-08 DIAGNOSIS — R1110 Vomiting, unspecified: Secondary | ICD-10-CM | POA: Diagnosis not present

## 2016-06-10 DIAGNOSIS — Z7901 Long term (current) use of anticoagulants: Secondary | ICD-10-CM | POA: Diagnosis not present

## 2016-06-16 DIAGNOSIS — E0590 Thyrotoxicosis, unspecified without thyrotoxic crisis or storm: Secondary | ICD-10-CM | POA: Diagnosis not present

## 2016-06-17 ENCOUNTER — Other Ambulatory Visit: Payer: Self-pay | Admitting: Internal Medicine

## 2016-06-18 DIAGNOSIS — Z7901 Long term (current) use of anticoagulants: Secondary | ICD-10-CM | POA: Diagnosis not present

## 2016-06-20 DIAGNOSIS — R062 Wheezing: Secondary | ICD-10-CM | POA: Diagnosis not present

## 2016-06-20 DIAGNOSIS — R0602 Shortness of breath: Secondary | ICD-10-CM | POA: Diagnosis not present

## 2016-06-20 DIAGNOSIS — R0600 Dyspnea, unspecified: Secondary | ICD-10-CM | POA: Diagnosis not present

## 2016-06-29 DIAGNOSIS — I482 Chronic atrial fibrillation: Secondary | ICD-10-CM | POA: Diagnosis not present

## 2016-06-29 DIAGNOSIS — M353 Polymyalgia rheumatica: Secondary | ICD-10-CM | POA: Diagnosis not present

## 2016-06-29 DIAGNOSIS — E0590 Thyrotoxicosis, unspecified without thyrotoxic crisis or storm: Secondary | ICD-10-CM | POA: Diagnosis not present

## 2016-06-29 DIAGNOSIS — Z7901 Long term (current) use of anticoagulants: Secondary | ICD-10-CM | POA: Diagnosis not present

## 2016-06-29 DIAGNOSIS — Z79899 Other long term (current) drug therapy: Secondary | ICD-10-CM | POA: Diagnosis not present

## 2016-07-09 DIAGNOSIS — Z7901 Long term (current) use of anticoagulants: Secondary | ICD-10-CM | POA: Diagnosis not present

## 2016-07-19 DIAGNOSIS — Z7901 Long term (current) use of anticoagulants: Secondary | ICD-10-CM | POA: Diagnosis not present

## 2016-07-21 ENCOUNTER — Ambulatory Visit (INDEPENDENT_AMBULATORY_CARE_PROVIDER_SITE_OTHER): Payer: Medicare Other | Admitting: *Deleted

## 2016-07-21 DIAGNOSIS — I429 Cardiomyopathy, unspecified: Secondary | ICD-10-CM | POA: Diagnosis not present

## 2016-07-21 DIAGNOSIS — Z95810 Presence of automatic (implantable) cardiac defibrillator: Secondary | ICD-10-CM

## 2016-07-21 DIAGNOSIS — I428 Other cardiomyopathies: Secondary | ICD-10-CM

## 2016-07-21 NOTE — Progress Notes (Signed)
Remote ICD transmission.   

## 2016-07-22 ENCOUNTER — Encounter: Payer: Self-pay | Admitting: Cardiology

## 2016-07-26 DIAGNOSIS — Z7901 Long term (current) use of anticoagulants: Secondary | ICD-10-CM | POA: Diagnosis not present

## 2016-07-26 LAB — CUP PACEART REMOTE DEVICE CHECK
Battery Remaining Longevity: 56 mo
Brady Statistic AP VP Percent: 90.87 %
Brady Statistic AS VP Percent: 7.83 %
Brady Statistic RA Percent Paced: 91.96 %
Brady Statistic RV Percent Paced: 98.7 %
Date Time Interrogation Session: 20170809063329
HIGH POWER IMPEDANCE MEASURED VALUE: 56 Ohm
HighPow Impedance: 70 Ohm
Implantable Lead Implant Date: 20080805
Implantable Lead Location: 753860
Implantable Lead Model: 4076
Implantable Lead Model: 7001
Lead Channel Impedance Value: 418 Ohm
Lead Channel Impedance Value: 418 Ohm
Lead Channel Impedance Value: 589 Ohm
Lead Channel Pacing Threshold Amplitude: 0.375 V
Lead Channel Pacing Threshold Amplitude: 1.5 V
Lead Channel Pacing Threshold Pulse Width: 0.4 ms
Lead Channel Sensing Intrinsic Amplitude: 8.125 mV
Lead Channel Sensing Intrinsic Amplitude: 8.125 mV
Lead Channel Setting Pacing Amplitude: 3 V
Lead Channel Setting Sensing Sensitivity: 0.6 mV
MDC IDC LEAD IMPLANT DT: 20080805
MDC IDC LEAD LOCATION: 753859
MDC IDC MSMT BATTERY VOLTAGE: 2.98 V
MDC IDC MSMT LEADCHNL RA SENSING INTR AMPL: 2.25 mV
MDC IDC MSMT LEADCHNL RA SENSING INTR AMPL: 2.25 mV
MDC IDC MSMT LEADCHNL RV PACING THRESHOLD PULSEWIDTH: 0.4 ms
MDC IDC SET LEADCHNL RA PACING AMPLITUDE: 2 V
MDC IDC SET LEADCHNL RV PACING PULSEWIDTH: 0.4 ms
MDC IDC STAT BRADY AP VS PERCENT: 1.09 %
MDC IDC STAT BRADY AS VS PERCENT: 0.21 %

## 2016-07-29 ENCOUNTER — Telehealth: Payer: Self-pay

## 2016-07-29 NOTE — Telephone Encounter (Addendum)
Referred to ICM clinic by Debroah Loop, device RN/Dr [name].  Spoke with patient and explained ICM program.  He agreed to monthly ICM calls.  Patient reported his PCP stopped Furosemide 40 mg and Potassium 20 mEq at least in the last 2 months because patient was complaining he was urinating too much.  He reported he is not sure if he is taking Carvedilol correctly.  He reported last prescription he received is Carvedilol 12.5 mg 1 tablet bid. He reported new medication for thyroid Methimazole 5 mg 2 tablets bid.  Reviewed 07/21/2016 remote transmission with patient.  Thoracic impedance below baseline since 06/16/2016.  He reported he is always SOB but unsure if that is related to his lungs or the heart.  He does not weigh at home.  He denied any swelling in legs, feet, hands or belly.    Advised would review medications with Dr [name] and call back with recommendations.    ICM trend: 07/21/2016

## 2016-07-29 NOTE — Telephone Encounter (Signed)
Reviewed transmission and meds with Dr [name].  He recommended patient take Furosemide 40 mg 1 tablet daily x 3 days and Potassium 20 mEq 1 tablet x 3 days.  Will evaluate the dosage after rechecking fluid levels on 08/02/2016.  Patient may be able to decrease Furosemide dosage to 20 mg daily.  Also he should check his BP at least 2 times before Monday to check how effective the current dosage of Cavedilol.    Call to patient and advised of recommendations from Dr [name].  Advised he should take Furosemide 40 mg 1 tablet daily x 3 days and Potassium 20 mEq 1 tablet x 3 days. He verbalized understanding.  He will send remote transmission.

## 2016-08-02 ENCOUNTER — Ambulatory Visit (INDEPENDENT_AMBULATORY_CARE_PROVIDER_SITE_OTHER): Payer: Medicare Other

## 2016-08-02 DIAGNOSIS — Z95810 Presence of automatic (implantable) cardiac defibrillator: Secondary | ICD-10-CM

## 2016-08-02 DIAGNOSIS — I429 Cardiomyopathy, unspecified: Secondary | ICD-10-CM

## 2016-08-02 DIAGNOSIS — I428 Other cardiomyopathies: Secondary | ICD-10-CM

## 2016-08-02 NOTE — Progress Notes (Signed)
EPIC Encounter for ICM Monitoring  Patient Name: Cody Gomez is a 82 y.o. male Date: 08/02/2016 Primary Care Physican: Cyndy Freeze, MD Primary Cardiologist: [name] Electrophysiologist: [name] Dry Weight: unknown       Heart Failure questions reviewed, pt reported hand swelling resolved and breathing has improved since taking Furosemide 40 mg x 3 days.   He reported his BP on 07/30/2016 was 140/72 and 08/01/2016 was 132/80.   MEDS:  On 8/7 Dr [name] recommended patient should take Furosemide 40 mg 1 tablet daily x 3 days and Potassium 20 mEq 1 tablet x 3 days to determine if medication should be restarted since PCP discontinued both a couple of months ago.  Dr [name] also recommended patient to record BP for a couple of days due to last pharmacy refill for Carvedilol was decreased from 25 mg bid to 12.5 mg bid and unsure reason for decrease.       After taking Furosemide 40 mg 1 tablet x 3 days and Potassium 20 mEq 1 tablet x 3 days, thoracic impedance returned to normal.  Recommendations:  Advised will discuss the medication dosages of Carvedilol, Furosemide and Potassium with Dr [name] after reviewing his BP readings and ICM fluid level recheck.  Explained will call him back with recommendations.  He is currently taking Carvedilol 12.5 mg 1 tablet bid and Furosemide 40 mg 1 tablet daily and Potassium 20 mEq 1 tablet daily.       Follow-up plan: ICM clinic phone appointment on 09/02/2016.  Copy of ICM check sent to device physician.   ICM trend: 08/02/2016       Rosalene Billings, RN 08/02/2016 8:43 AM

## 2016-08-03 MED ORDER — POTASSIUM CHLORIDE CRYS ER 20 MEQ PO TBCR: 20.0000 meq | EXTENDED_RELEASE_TABLET | ORAL | 3 refills | Status: DC

## 2016-08-03 MED ORDER — FUROSEMIDE 40 MG PO TABS: 40.0000 mg | ORAL_TABLET | ORAL | 3 refills | Status: DC

## 2016-08-03 NOTE — Progress Notes (Signed)
Reviewed with Dr [name] in the office and he recommended Furosemide 40 mg 1 tablet every other day, Potassium 20 mEq 1 tablet every other day (take on same days Furosemide is taken), and Coreg 25 mg 1 tablet bid.     Call to patient and advised of Dr Olin Pia recommendations to take Furosemide 40 mg 1 tablet every other day, Potassium 20 mEq 1 tablet every other day (take on same days Furosemide is taken), and Carvedilol 25 mg 1 tablet bid.   Patient reported he has a full bottle of Carvedilol 12.5 mg and will take 2 tablets bid until finished and then will need a new script for  Carvedilol 25 mg 1 tablet bid.  Patient does not need a new Potassium script at this time.  He verbalized understanding.   Next ICM transmission will be 09/02/2016.

## 2016-08-20 DIAGNOSIS — Z23 Encounter for immunization: Secondary | ICD-10-CM | POA: Diagnosis not present

## 2016-08-20 DIAGNOSIS — Z7901 Long term (current) use of anticoagulants: Secondary | ICD-10-CM | POA: Diagnosis not present

## 2016-08-20 DIAGNOSIS — Z0000 Encounter for general adult medical examination without abnormal findings: Secondary | ICD-10-CM | POA: Diagnosis not present

## 2016-08-30 DIAGNOSIS — Z7901 Long term (current) use of anticoagulants: Secondary | ICD-10-CM | POA: Diagnosis not present

## 2016-09-02 ENCOUNTER — Ambulatory Visit (INDEPENDENT_AMBULATORY_CARE_PROVIDER_SITE_OTHER): Payer: Medicare Other

## 2016-09-02 ENCOUNTER — Telehealth: Payer: Self-pay

## 2016-09-02 DIAGNOSIS — I428 Other cardiomyopathies: Secondary | ICD-10-CM

## 2016-09-02 DIAGNOSIS — I509 Heart failure, unspecified: Secondary | ICD-10-CM | POA: Diagnosis not present

## 2016-09-02 DIAGNOSIS — Z95810 Presence of automatic (implantable) cardiac defibrillator: Secondary | ICD-10-CM

## 2016-09-02 NOTE — Progress Notes (Signed)
Patient returned call and he stated he is feeling fine.  Advised the transmission has improved since August and he started taking Furosemide every other day.  He stated he tries to follow low salt diet but knows he is eating more than he probably should.  Weight: 202 lbs.  No changes today.  Advised to call for any fluid symptoms.

## 2016-09-02 NOTE — Telephone Encounter (Signed)
Remote ICM transmission received.  Attempted patient call and left message to return call.   

## 2016-09-02 NOTE — Progress Notes (Signed)
EPIC Encounter for ICM Monitoring  Patient Name: Cody Gomez is a 82 y.o. male Date: 09/02/2016 Primary Care Physican: Cyndy Freeze, MD Primary Cardiologist: [name] Electrophysiologist: [name] Dry Weight:  unknown       Attempted ICM call and unable to reach.  Transmission reviewed.   Thoracic impedance normal.  Follow-up plan: ICM clinic phone appointment on 10/05/2016.  Copy of ICM check sent to device physician.   ICM trend: 09/02/2016       Rosalene Billings, RN 09/02/2016 12:01 PM

## 2016-09-06 DIAGNOSIS — Z7901 Long term (current) use of anticoagulants: Secondary | ICD-10-CM | POA: Diagnosis not present

## 2016-09-10 DIAGNOSIS — N3091 Cystitis, unspecified with hematuria: Secondary | ICD-10-CM | POA: Diagnosis not present

## 2016-09-10 DIAGNOSIS — R300 Dysuria: Secondary | ICD-10-CM | POA: Diagnosis not present

## 2016-09-13 DIAGNOSIS — Z7901 Long term (current) use of anticoagulants: Secondary | ICD-10-CM | POA: Diagnosis not present

## 2016-09-18 DIAGNOSIS — J449 Chronic obstructive pulmonary disease, unspecified: Secondary | ICD-10-CM | POA: Diagnosis not present

## 2016-09-20 DIAGNOSIS — Z7901 Long term (current) use of anticoagulants: Secondary | ICD-10-CM | POA: Diagnosis not present

## 2016-09-21 DIAGNOSIS — E0590 Thyrotoxicosis, unspecified without thyrotoxic crisis or storm: Secondary | ICD-10-CM | POA: Diagnosis not present

## 2016-09-24 ENCOUNTER — Telehealth: Payer: Self-pay

## 2016-09-24 DIAGNOSIS — I5022 Chronic systolic (congestive) heart failure: Secondary | ICD-10-CM

## 2016-09-24 MED ORDER — CARVEDILOL 25 MG PO TABS: 25.0000 mg | ORAL_TABLET | Freq: Two times a day (BID) | ORAL | 6 refills | Status: DC

## 2016-09-24 NOTE — Telephone Encounter (Signed)
Returned patient call as requested.  Spoke with patient and he is confused about taking Carvedilol dosage.  Patient has been taking Carvedilol 12.5 mg 2 tablets in morning and 2 tablets in afternoon and he has run out early of the medication.  Advised would review notes to confirm he is taking correct dosage.

## 2016-09-24 NOTE — Telephone Encounter (Signed)
Call back to patient and reviewed ICM note from August 2017.  Dr [name] recommended patient take Carvedilol 25 mg bid which is correct dosage.  Advised would send in refill and he will be taking 25 mg 1 tablet twice a day.  Reminded patient he had finished a bottle of 12.5 mg tablet he previously had so he needs to understand these tablets are 25 mg tablets.  He verbalized understanding and repeated it back to me.  He stated his PCP had place Furosemide and Potassium on hold for a few days since I last spoke with him but he has resumed that now.  Sent refill to pharmacy.

## 2016-10-05 ENCOUNTER — Ambulatory Visit (INDEPENDENT_AMBULATORY_CARE_PROVIDER_SITE_OTHER): Payer: Medicare Other

## 2016-10-05 DIAGNOSIS — I5022 Chronic systolic (congestive) heart failure: Secondary | ICD-10-CM

## 2016-10-05 DIAGNOSIS — Z95810 Presence of automatic (implantable) cardiac defibrillator: Secondary | ICD-10-CM | POA: Diagnosis not present

## 2016-10-07 NOTE — Progress Notes (Signed)
EPIC Encounter for ICM Monitoring  Patient Name: Cody Gomez is a 82 y.o. male Date: 10/07/2016 Primary Care Physican: Cyndy Freeze, MD Primary [place] Electrophysiologist: [name] Dry Weight: unknown        Heart Failure questions reviewed, pt asymptomatic   Thoracic impedance returned to normal   Recommendations: No changes.  Advised to limit salt intake to 2000 mg daily.  Encouraged to call for fluid symptoms.    Follow-up plan: ICM clinic phone appointment on 11/09/2016.  Copy of ICM check sent to cardiologist/device physician.   ICM trend: 10/04/2016       Rosalene Billings, RN 10/07/2016 10:39 AM

## 2016-10-15 DIAGNOSIS — Z7901 Long term (current) use of anticoagulants: Secondary | ICD-10-CM | POA: Diagnosis not present

## 2016-11-08 DIAGNOSIS — Z7901 Long term (current) use of anticoagulants: Secondary | ICD-10-CM | POA: Diagnosis not present

## 2016-11-08 DIAGNOSIS — N3020 Other chronic cystitis without hematuria: Secondary | ICD-10-CM | POA: Diagnosis not present

## 2016-11-08 DIAGNOSIS — C61 Malignant neoplasm of prostate: Secondary | ICD-10-CM | POA: Diagnosis not present

## 2016-11-09 ENCOUNTER — Ambulatory Visit (INDEPENDENT_AMBULATORY_CARE_PROVIDER_SITE_OTHER): Payer: Medicare Other | Admitting: *Deleted

## 2016-11-09 DIAGNOSIS — I5022 Chronic systolic (congestive) heart failure: Secondary | ICD-10-CM

## 2016-11-09 DIAGNOSIS — Z95810 Presence of automatic (implantable) cardiac defibrillator: Secondary | ICD-10-CM | POA: Diagnosis not present

## 2016-11-09 DIAGNOSIS — E0590 Thyrotoxicosis, unspecified without thyrotoxic crisis or storm: Secondary | ICD-10-CM | POA: Diagnosis not present

## 2016-11-09 DIAGNOSIS — Z7901 Long term (current) use of anticoagulants: Secondary | ICD-10-CM | POA: Diagnosis not present

## 2016-11-09 DIAGNOSIS — M545 Low back pain: Secondary | ICD-10-CM | POA: Diagnosis not present

## 2016-11-09 DIAGNOSIS — I428 Other cardiomyopathies: Secondary | ICD-10-CM | POA: Diagnosis not present

## 2016-11-09 DIAGNOSIS — G8929 Other chronic pain: Secondary | ICD-10-CM | POA: Diagnosis not present

## 2016-11-10 NOTE — Progress Notes (Signed)
Remote ICD transmission.   

## 2016-11-11 ENCOUNTER — Encounter: Payer: Self-pay | Admitting: Cardiology

## 2016-11-11 NOTE — Progress Notes (Signed)
EPIC Encounter for ICM Monitoring  Patient Name: Cody Gomez is a 82 y.o. male Date: 11/11/2016 Primary Care Physican: Cyndy Freeze, MD Primary [place] Electrophysiologist: [name] Dry Weight:    unknown      Attempted ICM call and unable to reach. Left message to return call. Transmission reviewed.   Thoracic impedance abnormal suggesting fluid accumulation but is starting upward trend toward baseline.  Recommendations: NONE - Unable to reach.    Follow-up plan: ICM clinic phone appointment on 12/14/2016.  Copy of ICM check sent to device physician.   ICM trend: 11/10/2016       Rosalene Billings, RN 11/11/2016 2:31 PM

## 2016-12-08 DIAGNOSIS — J22 Unspecified acute lower respiratory infection: Secondary | ICD-10-CM | POA: Diagnosis not present

## 2016-12-08 LAB — CUP PACEART REMOTE DEVICE CHECK
Battery Remaining Longevity: 51 mo
Battery Voltage: 2.98 V
Brady Statistic AP VP Percent: 90.66 %
Brady Statistic AS VP Percent: 7.36 %
Brady Statistic AS VS Percent: 0.62 %
HighPow Impedance: 56 Ohm
HighPow Impedance: 72 Ohm
Implantable Lead Implant Date: 20080805
Implantable Lead Location: 753860
Implantable Lead Model: 4076
Implantable Lead Model: 7001
Lead Channel Impedance Value: 608 Ohm
Lead Channel Pacing Threshold Amplitude: 0.375 V
Lead Channel Pacing Threshold Amplitude: 1 V
Lead Channel Pacing Threshold Pulse Width: 0.4 ms
Lead Channel Sensing Intrinsic Amplitude: 2.25 mV
Lead Channel Sensing Intrinsic Amplitude: 2.25 mV
Lead Channel Sensing Intrinsic Amplitude: 6.75 mV
Lead Channel Sensing Intrinsic Amplitude: 6.75 mV
Lead Channel Setting Pacing Amplitude: 2 V
Lead Channel Setting Pacing Pulse Width: 0.4 ms
MDC IDC LEAD IMPLANT DT: 20080805
MDC IDC LEAD LOCATION: 753859
MDC IDC MSMT LEADCHNL RA IMPEDANCE VALUE: 456 Ohm
MDC IDC MSMT LEADCHNL RV IMPEDANCE VALUE: 475 Ohm
MDC IDC MSMT LEADCHNL RV PACING THRESHOLD PULSEWIDTH: 0.4 ms
MDC IDC PG IMPLANT DT: 20140711
MDC IDC SESS DTM: 20171128062206
MDC IDC SET LEADCHNL RV PACING AMPLITUDE: 2.75 V
MDC IDC SET LEADCHNL RV SENSING SENSITIVITY: 0.6 mV
MDC IDC STAT BRADY AP VS PERCENT: 1.36 %
MDC IDC STAT BRADY RA PERCENT PACED: 91.62 %
MDC IDC STAT BRADY RV PERCENT PACED: 97.79 %

## 2016-12-10 IMAGING — CR DG CHEST 2V
2 series · 2 of 2 positions shown · non-contrast
Comparison: 05/21/2014.

CLINICAL DATA: 83-year-old male with dizziness and weakness for 3
days. Chest pain. Initial encounter.

EXAM:
CHEST  2 VIEW

[chest pa]
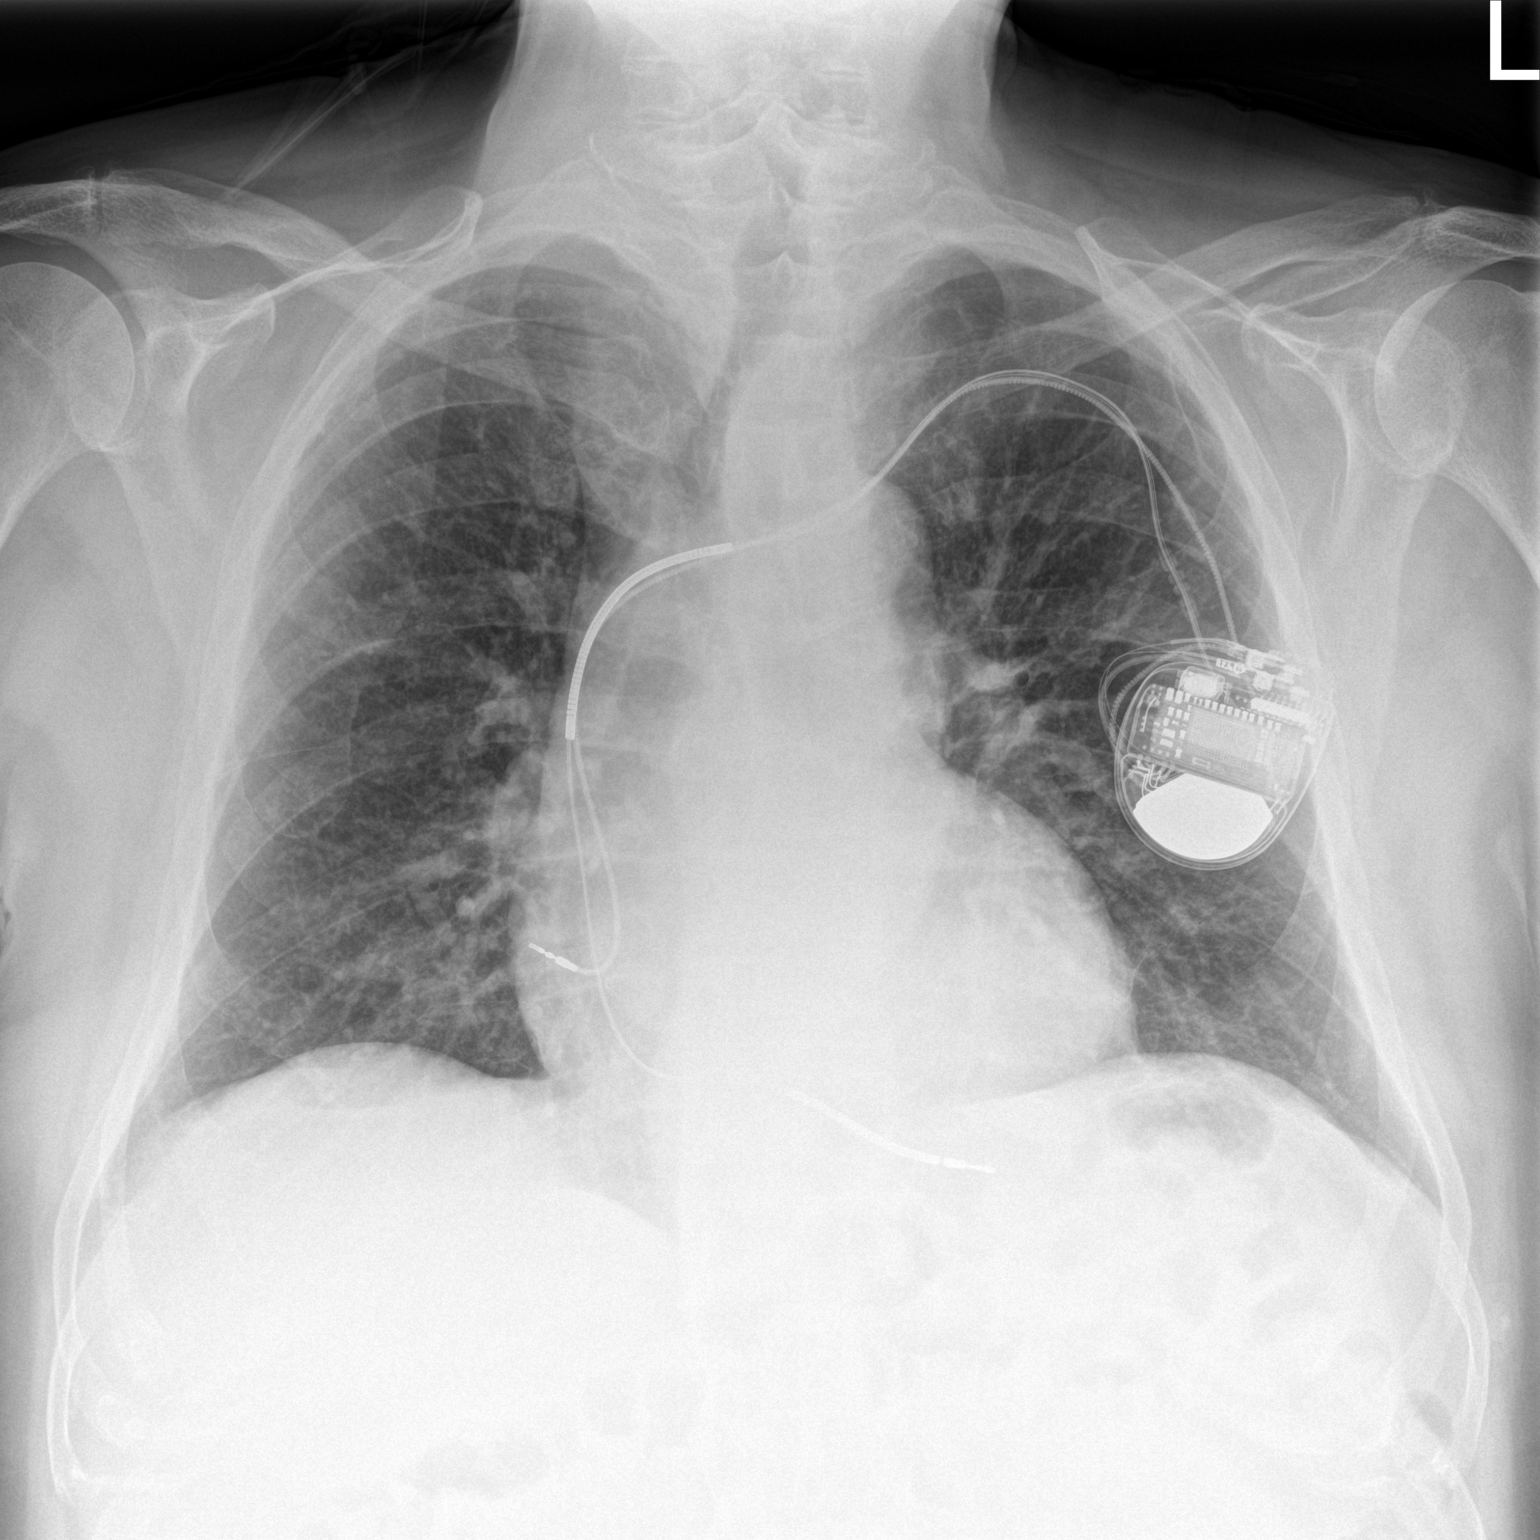

[chest lat]
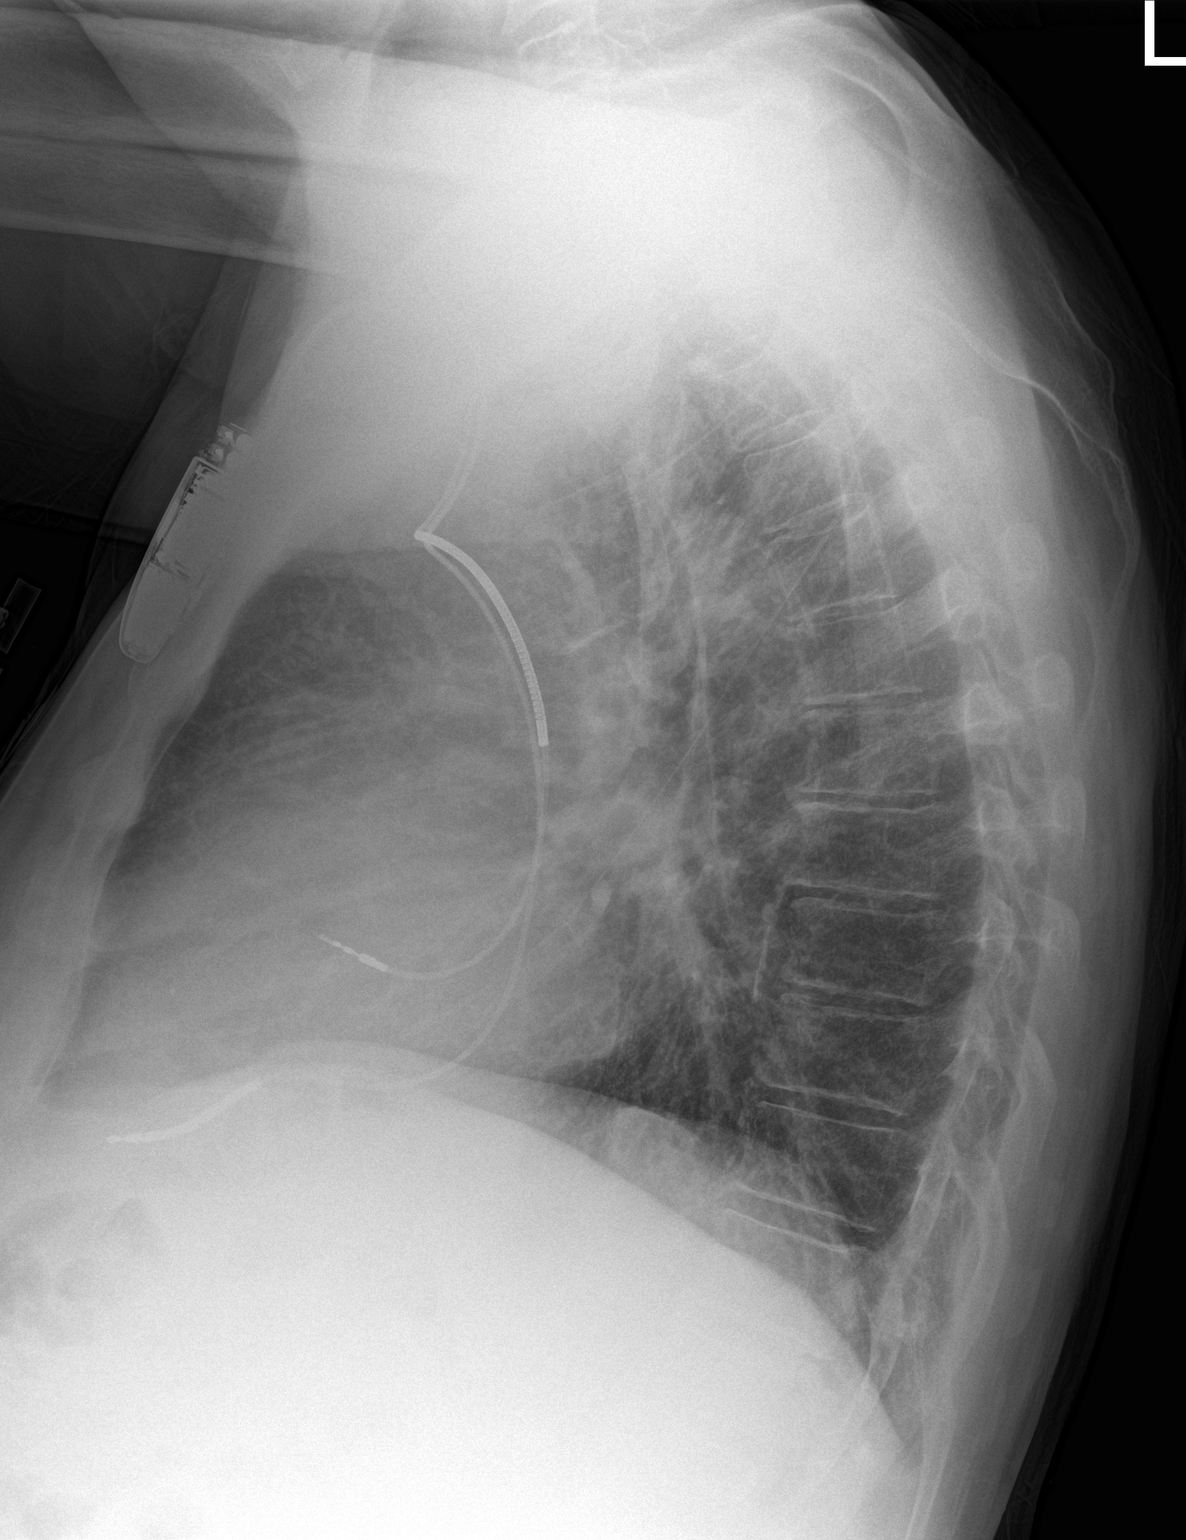

[2 of 2 positions shown; findings below may reference images not displayed]

FINDINGS: Stable cardiomegaly and mediastinal contours. Stable lung volumes.
Stable left chest cardiac AICD. No pneumothorax, pulmonary edema,
pleural effusion or acute pulmonary opacity. Calcified
atherosclerosis of the aorta. No acute osseous abnormality
identified.
IMPRESSION: No acute cardiopulmonary abnormality.

## 2016-12-14 ENCOUNTER — Telehealth: Payer: Self-pay | Admitting: Cardiology

## 2016-12-14 ENCOUNTER — Ambulatory Visit (INDEPENDENT_AMBULATORY_CARE_PROVIDER_SITE_OTHER): Payer: Medicare Other

## 2016-12-14 ENCOUNTER — Telehealth: Payer: Self-pay

## 2016-12-14 DIAGNOSIS — Z95810 Presence of automatic (implantable) cardiac defibrillator: Secondary | ICD-10-CM | POA: Diagnosis not present

## 2016-12-14 DIAGNOSIS — Z7901 Long term (current) use of anticoagulants: Secondary | ICD-10-CM | POA: Diagnosis not present

## 2016-12-14 DIAGNOSIS — I5022 Chronic systolic (congestive) heart failure: Secondary | ICD-10-CM

## 2016-12-14 NOTE — Telephone Encounter (Signed)
Remote ICM transmission received.  Attempted patient call and left message to return call.   

## 2016-12-14 NOTE — Progress Notes (Signed)
Patient returned call and he stated he was feeling fine.  He denied any fluid symptoms.  Next ICM remote transmission 02/22/2017 and encouraged to call for any fluid symptoms.

## 2016-12-14 NOTE — Telephone Encounter (Signed)
Spoke with pt and reminded pt of remote transmission that is due today. Pt verbalized understanding.   

## 2016-12-14 NOTE — Progress Notes (Signed)
EPIC Encounter for ICM Monitoring  Patient Name: Cody Gomez is a 83 y.o. male Date: 12/14/2016 Primary Care Physican: Cyndy Freeze, MD Primary [place] Electrophysiologist: Faustino Congress Weight:unknown         Attempted ICM call and unable to reach.  Left message to return call.  Transmission reviewed.   Thoracic impedance normal but was abnormal suggesting fluid accumulation from 11/06/2016 to 12/02/2016.  Recommendations:  Left direct ICM number  Follow-up plan: ICM clinic phone appointment on 02/22/2017 and office appointment with Dr [name] on 01/20/2017 for defib check.   Copy of ICM check sent to device physician.   3 month ICM trend : 12/14/2016   1 Year ICM trend:      Rosalene Billings, RN 12/14/2016 2:01 PM

## 2016-12-21 ENCOUNTER — Other Ambulatory Visit: Payer: Self-pay | Admitting: Internal Medicine

## 2016-12-22 NOTE — Telephone Encounter (Signed)
Rx refill sent to pharmacy. 

## 2016-12-24 ENCOUNTER — Other Ambulatory Visit: Payer: Self-pay | Admitting: *Deleted

## 2016-12-24 MED ORDER — MEXILETINE HCL 150 MG PO CAPS: ORAL_CAPSULE | ORAL | 0 refills | Status: DC

## 2016-12-28 DIAGNOSIS — Z7901 Long term (current) use of anticoagulants: Secondary | ICD-10-CM | POA: Diagnosis not present

## 2017-01-18 DIAGNOSIS — Z7901 Long term (current) use of anticoagulants: Secondary | ICD-10-CM | POA: Diagnosis not present

## 2017-01-20 ENCOUNTER — Ambulatory Visit (INDEPENDENT_AMBULATORY_CARE_PROVIDER_SITE_OTHER): Payer: Medicare Other | Admitting: Internal Medicine

## 2017-01-20 ENCOUNTER — Encounter (INDEPENDENT_AMBULATORY_CARE_PROVIDER_SITE_OTHER): Payer: Self-pay

## 2017-01-20 ENCOUNTER — Encounter: Payer: Self-pay | Admitting: Internal Medicine

## 2017-01-20 VITALS — BP 122/70 | HR 76 | Ht 66.0 in | Wt 206.2 lb

## 2017-01-20 DIAGNOSIS — I472 Ventricular tachycardia, unspecified: Secondary | ICD-10-CM

## 2017-01-20 DIAGNOSIS — R0600 Dyspnea, unspecified: Secondary | ICD-10-CM

## 2017-01-20 DIAGNOSIS — I480 Paroxysmal atrial fibrillation: Secondary | ICD-10-CM | POA: Diagnosis not present

## 2017-01-20 DIAGNOSIS — Z79899 Other long term (current) drug therapy: Secondary | ICD-10-CM | POA: Diagnosis not present

## 2017-01-20 DIAGNOSIS — I5022 Chronic systolic (congestive) heart failure: Secondary | ICD-10-CM | POA: Diagnosis not present

## 2017-01-20 DIAGNOSIS — Z95810 Presence of automatic (implantable) cardiac defibrillator: Secondary | ICD-10-CM

## 2017-01-20 DIAGNOSIS — I428 Other cardiomyopathies: Secondary | ICD-10-CM | POA: Diagnosis not present

## 2017-01-20 LAB — CUP PACEART INCLINIC DEVICE CHECK
Battery Voltage: 2.97 V
Brady Statistic RA Percent Paced: 89.7 %
Brady Statistic RV Percent Paced: 97.06 %
Date Time Interrogation Session: 20180208162909
HIGH POWER IMPEDANCE MEASURED VALUE: 59 Ohm
HighPow Impedance: 76 Ohm
Implantable Lead Implant Date: 20080805
Implantable Lead Location: 753859
Implantable Lead Location: 753860
Implantable Lead Model: 4076
Implantable Pulse Generator Implant Date: 20140711
Lead Channel Impedance Value: 418 Ohm
Lead Channel Impedance Value: 475 Ohm
Lead Channel Impedance Value: 608 Ohm
Lead Channel Pacing Threshold Pulse Width: 0.4 ms
Lead Channel Sensing Intrinsic Amplitude: 3 mV
Lead Channel Setting Pacing Amplitude: 2 V
MDC IDC LEAD IMPLANT DT: 20080805
MDC IDC MSMT BATTERY REMAINING LONGEVITY: 49 mo
MDC IDC MSMT LEADCHNL RA PACING THRESHOLD AMPLITUDE: 0.5 V
MDC IDC MSMT LEADCHNL RV PACING THRESHOLD AMPLITUDE: 1.25 V
MDC IDC MSMT LEADCHNL RV PACING THRESHOLD PULSEWIDTH: 0.4 ms
MDC IDC MSMT LEADCHNL RV SENSING INTR AMPL: 6.625 mV
MDC IDC SET LEADCHNL RV PACING AMPLITUDE: 2.75 V
MDC IDC SET LEADCHNL RV PACING PULSEWIDTH: 0.4 ms
MDC IDC SET LEADCHNL RV SENSING SENSITIVITY: 0.6 mV
MDC IDC STAT BRADY AP VP PERCENT: 88.28 %
MDC IDC STAT BRADY AP VS PERCENT: 1.98 %
MDC IDC STAT BRADY AS VP PERCENT: 9.21 %
MDC IDC STAT BRADY AS VS PERCENT: 0.53 %

## 2017-01-20 MED ORDER — CARVEDILOL 12.5 MG PO TABS: 12.5000 mg | ORAL_TABLET | Freq: Two times a day (BID) | ORAL | 3 refills | Status: DC

## 2017-01-20 NOTE — Patient Instructions (Signed)
Medication Instructions:    Your physician has recommended you make the following change in your medication:  1) DECREASE Carvedilol to 12.5 mg twice a day  --- If you need a refill on your cardiac medications before your next appointment, please call your pharmacy. ---  Labwork:  Today: BMET, Magnesium level and TSH  Testing/Procedures:  None ordered  Follow-Up:  Your physician wants you to follow-up in: 6 months with Tommye Standard, PA.  You will receive a reminder letter in the mail two months in advance. If you don't receive a letter, please call our office to schedule the follow-up appointment.   Thank you for choosing [facility]!!
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
## 2017-01-21 ENCOUNTER — Other Ambulatory Visit: Payer: Self-pay | Admitting: *Deleted

## 2017-01-21 LAB — BASIC METABOLIC PANEL
BUN/Creatinine Ratio: 17 (ref 10–24)
BUN: 16 mg/dL (ref 8–27)
CHLORIDE: 99 mmol/L (ref 96–106)
CO2: 23 mmol/L (ref 18–29)
Calcium: 9.7 mg/dL (ref 8.6–10.2)
Creatinine, Ser: 0.94 mg/dL (ref 0.76–1.27)
GFR calc Af Amer: 86 mL/min/{1.73_m2} (ref >59)
GFR calc non Af Amer: 75 mL/min/{1.73_m2} (ref >59)
Glucose: 99 mg/dL (ref 65–99)
Potassium: 3.8 mmol/L (ref 3.5–5.2)
Sodium: 138 mmol/L (ref 134–144)

## 2017-01-21 LAB — TSH: TSH: 6.79 u[IU]/mL — ABNORMAL HIGH (ref 0.450–4.500)

## 2017-01-21 LAB — MAGNESIUM: MAGNESIUM: 2.2 mg/dL (ref 1.6–2.3)

## 2017-01-25 ENCOUNTER — Telehealth: Payer: Self-pay | Admitting: Internal Medicine

## 2017-01-25 NOTE — Telephone Encounter (Signed)
Called pt to inform him that his Rx was already sent to his pharmacy as requested. Pt verbalized understanding.

## 2017-01-25 NOTE — Telephone Encounter (Signed)
New Message     Last week Dr [name] changed his prescription to 12.5, all he has is 25mg  and he can not cut the pills in half, he needs a new prescription called into [facility] in [place]  90 days    *STAT* If patient is at the pharmacy, call can be transferred to refill team.   1. Which medications need to be refilled? (please list name of each medication and dose if known)  carvedilol (COREG) 12.5 MG tablet Take 1 tablet (12.5 mg total) by mouth 2 (two) times daily.     2. Which pharmacy/location (including street and city if local pharmacy) is medication to be sent to? [facility] in [place]  3. Do they need a 30 day or 90 day supply? 90 days

## 2017-01-27 DIAGNOSIS — H259 Unspecified age-related cataract: Secondary | ICD-10-CM | POA: Diagnosis not present

## 2017-01-27 DIAGNOSIS — E0590 Thyrotoxicosis, unspecified without thyrotoxic crisis or storm: Secondary | ICD-10-CM | POA: Diagnosis not present

## 2017-01-27 DIAGNOSIS — S161XXA Strain of muscle, fascia and tendon at neck level, initial encounter: Secondary | ICD-10-CM | POA: Diagnosis not present

## 2017-01-27 DIAGNOSIS — J4520 Mild intermittent asthma, uncomplicated: Secondary | ICD-10-CM | POA: Diagnosis not present

## 2017-01-27 DIAGNOSIS — Z0000 Encounter for general adult medical examination without abnormal findings: Secondary | ICD-10-CM | POA: Diagnosis not present

## 2017-01-30 ENCOUNTER — Other Ambulatory Visit: Payer: Self-pay | Admitting: Internal Medicine

## 2017-02-03 ENCOUNTER — Other Ambulatory Visit: Payer: Self-pay

## 2017-02-03 ENCOUNTER — Ambulatory Visit (HOSPITAL_COMMUNITY): Payer: Medicare Other | Attending: Cardiology

## 2017-02-03 DIAGNOSIS — E785 Hyperlipidemia, unspecified: Secondary | ICD-10-CM | POA: Diagnosis not present

## 2017-02-03 DIAGNOSIS — I472 Ventricular tachycardia: Secondary | ICD-10-CM | POA: Insufficient documentation

## 2017-02-03 DIAGNOSIS — G4733 Obstructive sleep apnea (adult) (pediatric): Secondary | ICD-10-CM | POA: Diagnosis not present

## 2017-02-03 DIAGNOSIS — R0600 Dyspnea, unspecified: Secondary | ICD-10-CM | POA: Diagnosis not present

## 2017-02-03 DIAGNOSIS — E0590 Thyrotoxicosis, unspecified without thyrotoxic crisis or storm: Secondary | ICD-10-CM | POA: Insufficient documentation

## 2017-02-03 DIAGNOSIS — I352 Nonrheumatic aortic (valve) stenosis with insufficiency: Secondary | ICD-10-CM | POA: Diagnosis not present

## 2017-02-03 DIAGNOSIS — I119 Hypertensive heart disease without heart failure: Secondary | ICD-10-CM | POA: Insufficient documentation

## 2017-02-03 DIAGNOSIS — I340 Nonrheumatic mitral (valve) insufficiency: Secondary | ICD-10-CM | POA: Diagnosis not present

## 2017-02-03 DIAGNOSIS — I4891 Unspecified atrial fibrillation: Secondary | ICD-10-CM | POA: Diagnosis not present

## 2017-02-03 DIAGNOSIS — I420 Dilated cardiomyopathy: Secondary | ICD-10-CM | POA: Diagnosis not present

## 2017-02-03 DIAGNOSIS — I77810 Thoracic aortic ectasia: Secondary | ICD-10-CM | POA: Diagnosis not present

## 2017-02-03 DIAGNOSIS — I361 Nonrheumatic tricuspid (valve) insufficiency: Secondary | ICD-10-CM | POA: Diagnosis not present

## 2017-02-04 ENCOUNTER — Telehealth: Payer: Self-pay | Admitting: Internal Medicine

## 2017-02-04 NOTE — Telephone Encounter (Signed)
I spoke with the patient regarding his echo results.  

## 2017-02-04 NOTE — Telephone Encounter (Signed)
°  Follow Up   Pt is calling up on Echo results. Please call.

## 2017-02-10 ENCOUNTER — Encounter: Payer: Self-pay | Admitting: Internal Medicine

## 2017-02-16 ENCOUNTER — Encounter: Payer: Medicare Other | Admitting: Internal Medicine

## 2017-02-18 ENCOUNTER — Ambulatory Visit (INDEPENDENT_AMBULATORY_CARE_PROVIDER_SITE_OTHER): Payer: Medicare Other | Admitting: Internal Medicine

## 2017-02-18 ENCOUNTER — Encounter: Payer: Self-pay | Admitting: Internal Medicine

## 2017-02-18 VITALS — BP 140/80 | HR 82 | Ht 66.0 in | Wt 203.6 lb

## 2017-02-18 DIAGNOSIS — I428 Other cardiomyopathies: Secondary | ICD-10-CM | POA: Diagnosis not present

## 2017-02-18 DIAGNOSIS — I472 Ventricular tachycardia, unspecified: Secondary | ICD-10-CM

## 2017-02-18 DIAGNOSIS — Z79899 Other long term (current) drug therapy: Secondary | ICD-10-CM | POA: Diagnosis not present

## 2017-02-18 DIAGNOSIS — Z01812 Encounter for preprocedural laboratory examination: Secondary | ICD-10-CM

## 2017-02-18 DIAGNOSIS — I5022 Chronic systolic (congestive) heart failure: Secondary | ICD-10-CM

## 2017-02-18 DIAGNOSIS — Z95810 Presence of automatic (implantable) cardiac defibrillator: Secondary | ICD-10-CM | POA: Diagnosis not present

## 2017-02-18 LAB — CUP PACEART INCLINIC DEVICE CHECK
Battery Remaining Longevity: 47 mo
Battery Voltage: 2.97 V
Brady Statistic AP VP Percent: 82.32 %
Brady Statistic AP VS Percent: 2.36 %
Brady Statistic AS VP Percent: 14.44 %
Brady Statistic AS VS Percent: 0.88 %
HighPow Impedance: 65 Ohm
HighPow Impedance: 86 Ohm
Implantable Lead Location: 753859
Implantable Lead Location: 753860
Implantable Lead Model: 7001
Lead Channel Impedance Value: 513 Ohm
Lead Channel Impedance Value: 646 Ohm
Lead Channel Pacing Threshold Amplitude: 1.25 V
Lead Channel Pacing Threshold Pulse Width: 0.4 ms
Lead Channel Sensing Intrinsic Amplitude: 2.875 mV
Lead Channel Sensing Intrinsic Amplitude: 4.625 mV
Lead Channel Sensing Intrinsic Amplitude: 8 mV
Lead Channel Setting Pacing Pulse Width: 0.4 ms
MDC IDC LEAD IMPLANT DT: 20080805
MDC IDC LEAD IMPLANT DT: 20080805
MDC IDC MSMT LEADCHNL RA PACING THRESHOLD AMPLITUDE: 0.5 V
MDC IDC MSMT LEADCHNL RA PACING THRESHOLD PULSEWIDTH: 0.4 ms
MDC IDC MSMT LEADCHNL RV IMPEDANCE VALUE: 456 Ohm
MDC IDC MSMT LEADCHNL RV SENSING INTR AMPL: 9.125 mV
MDC IDC PG IMPLANT DT: 20140711
MDC IDC SESS DTM: 20180309172045
MDC IDC SET LEADCHNL RA PACING AMPLITUDE: 2 V
MDC IDC SET LEADCHNL RV PACING AMPLITUDE: 3 V
MDC IDC SET LEADCHNL RV SENSING SENSITIVITY: 0.6 mV
MDC IDC STAT BRADY RA PERCENT PACED: 83.18 %
MDC IDC STAT BRADY RV PERCENT PACED: 95.47 %

## 2017-02-18 NOTE — Patient Instructions (Signed)
Medication Instructions: - Your physician recommends that you continue on your current medications as directed. Please refer to the Current Medication list given to you today.  Labwork: - Your physician recommends that you have lab work today: BMP/ CBC/ INR  Procedures/Testing: - Your physician has requested that you have a cardiac catheterization. Cardiac catheterization is used to diagnose and/or treat various heart conditions. Doctors may recommend this procedure for a number of different reasons. The most common reason is to evaluate chest pain. Chest pain can be a symptom of coronary artery disease (CAD), and cardiac catheterization can show whether plaque is narrowing or blocking your heart's arteries. This procedure is also used to evaluate the valves, as well as measure the blood flow and oxygen levels in different parts of your heart. For further information please visit [email or web address].   - Scheduled for Monday 02/28/17 - arrive at [facility] at 8:30 am. Nira Conn will mail a detailed letter of instructions)   Follow-Up: Your physician recommends that you schedule a follow-up appointment in: 4-6 weeks (from 02/28/17)   Any Additional Special Instructions Will Be Listed Below (If Applicable).     If you need a refill on your cardiac medications before your next appointment, please call your pharmacy.
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
## 2017-02-19 LAB — CBC WITH DIFFERENTIAL/PLATELET
BASOS ABS: 0 10*3/uL (ref 0.0–0.2)
Basos: 1 %
EOS (ABSOLUTE): 0.1 10*3/uL (ref 0.0–0.4)
Eos: 2 %
Hematocrit: 47.2 % (ref 37.5–51.0)
Hemoglobin: 16 g/dL (ref 13.0–17.7)
IMMATURE GRANS (ABS): 0 10*3/uL (ref 0.0–0.1)
IMMATURE GRANULOCYTES: 0 %
LYMPHS: 15 %
Lymphocytes Absolute: 1.1 10*3/uL (ref 0.7–3.1)
MCH: 30.5 pg (ref 26.6–33.0)
MCHC: 33.9 g/dL (ref 31.5–35.7)
MCV: 90 fL (ref 79–97)
MONOS ABS: 0.3 10*3/uL (ref 0.1–0.9)
Monocytes: 4 %
NEUTROS ABS: 5.7 10*3/uL (ref 1.4–7.0)
NEUTROS PCT: 78 %
PLATELETS: 272 10*3/uL (ref 150–379)
RBC: 5.24 x10E6/uL (ref 4.14–5.80)
RDW: 13.5 % (ref 12.3–15.4)
WBC: 7.2 10*3/uL (ref 3.4–10.8)

## 2017-02-19 LAB — BASIC METABOLIC PANEL
BUN/Creatinine Ratio: 17 (ref 10–24)
BUN: 17 mg/dL (ref 8–27)
CALCIUM: 9.9 mg/dL (ref 8.6–10.2)
CHLORIDE: 98 mmol/L (ref 96–106)
CO2: 22 mmol/L (ref 18–29)
Creatinine, Ser: 1 mg/dL (ref 0.76–1.27)
GFR calc Af Amer: 80 mL/min/{1.73_m2} (ref >59)
GFR calc non Af Amer: 69 mL/min/{1.73_m2} (ref >59)
Glucose: 108 mg/dL — ABNORMAL HIGH (ref 65–99)
POTASSIUM: 4.5 mmol/L (ref 3.5–5.2)
SODIUM: 138 mmol/L (ref 134–144)

## 2017-02-19 LAB — PROTIME-INR
INR: 1.8 — AB (ref 0.8–1.2)
Prothrombin Time: 18.5 s — ABNORMAL HIGH (ref 9.1–12.0)

## 2017-02-22 ENCOUNTER — Telehealth: Payer: Self-pay

## 2017-02-22 ENCOUNTER — Ambulatory Visit (INDEPENDENT_AMBULATORY_CARE_PROVIDER_SITE_OTHER): Payer: Medicare Other

## 2017-02-22 DIAGNOSIS — I5022 Chronic systolic (congestive) heart failure: Secondary | ICD-10-CM

## 2017-02-22 DIAGNOSIS — Z95810 Presence of automatic (implantable) cardiac defibrillator: Secondary | ICD-10-CM

## 2017-02-22 NOTE — Telephone Encounter (Signed)
Patient is aware and agreeable to normal lab results

## 2017-02-23 ENCOUNTER — Telehealth: Payer: Self-pay | Admitting: *Deleted

## 2017-02-23 ENCOUNTER — Encounter (HOSPITAL_COMMUNITY): Payer: Self-pay | Admitting: Emergency Medicine

## 2017-02-23 ENCOUNTER — Emergency Department (HOSPITAL_COMMUNITY): Payer: Medicare Other

## 2017-02-23 DIAGNOSIS — Z79899 Other long term (current) drug therapy: Secondary | ICD-10-CM | POA: Diagnosis not present

## 2017-02-23 DIAGNOSIS — R635 Abnormal weight gain: Secondary | ICD-10-CM | POA: Diagnosis not present

## 2017-02-23 DIAGNOSIS — I509 Heart failure, unspecified: Secondary | ICD-10-CM | POA: Diagnosis not present

## 2017-02-23 DIAGNOSIS — J449 Chronic obstructive pulmonary disease, unspecified: Secondary | ICD-10-CM | POA: Diagnosis not present

## 2017-02-23 DIAGNOSIS — Z85828 Personal history of other malignant neoplasm of skin: Secondary | ICD-10-CM | POA: Insufficient documentation

## 2017-02-23 DIAGNOSIS — R600 Localized edema: Secondary | ICD-10-CM | POA: Insufficient documentation

## 2017-02-23 DIAGNOSIS — R0602 Shortness of breath: Secondary | ICD-10-CM | POA: Diagnosis not present

## 2017-02-23 DIAGNOSIS — Z8546 Personal history of malignant neoplasm of prostate: Secondary | ICD-10-CM | POA: Insufficient documentation

## 2017-02-23 DIAGNOSIS — Z7901 Long term (current) use of anticoagulants: Secondary | ICD-10-CM | POA: Diagnosis not present

## 2017-02-23 DIAGNOSIS — Z87891 Personal history of nicotine dependence: Secondary | ICD-10-CM | POA: Insufficient documentation

## 2017-02-23 DIAGNOSIS — I10 Essential (primary) hypertension: Secondary | ICD-10-CM | POA: Diagnosis not present

## 2017-02-23 DIAGNOSIS — I5040 Unspecified combined systolic (congestive) and diastolic (congestive) heart failure: Secondary | ICD-10-CM | POA: Diagnosis not present

## 2017-02-23 LAB — BASIC METABOLIC PANEL
ANION GAP: 10 (ref 5–15)
BUN: 15 mg/dL (ref 6–20)
CALCIUM: 9.4 mg/dL (ref 8.9–10.3)
CO2: 26 mmol/L (ref 22–32)
Chloride: 104 mmol/L (ref 101–111)
Creatinine, Ser: 0.91 mg/dL (ref 0.61–1.24)
Glucose, Bld: 134 mg/dL — ABNORMAL HIGH (ref 65–99)
POTASSIUM: 4 mmol/L (ref 3.5–5.1)
SODIUM: 140 mmol/L (ref 135–145)

## 2017-02-23 LAB — CBC
HEMATOCRIT: 44.7 % (ref 39.0–52.0)
HEMOGLOBIN: 14.4 g/dL (ref 13.0–17.0)
MCH: 29.7 pg (ref 26.0–34.0)
MCHC: 32.2 g/dL (ref 30.0–36.0)
MCV: 92.2 fL (ref 78.0–100.0)
Platelets: 266 10*3/uL (ref 150–400)
RBC: 4.85 MIL/uL (ref 4.22–5.81)
RDW: 13.2 % (ref 11.5–15.5)
WBC: 4.7 10*3/uL (ref 4.0–10.5)

## 2017-02-23 LAB — I-STAT TROPONIN, ED: TROPONIN I, POC: 0 ng/mL (ref 0.00–0.08)

## 2017-02-23 NOTE — Telephone Encounter (Signed)
Dr. Burt Knack scheduled to perform cath on patient next Monday. Dr. Burt Knack received call from Dr. Saul Fordyce office in [place] -- pt there in acute HF.  BNP around 1500, xray showed possible sm amt fluid around lungs, increased Lasix to everyday. Dr. Burt Knack requested we call patient to determine if needs extender appt in office tomorrow or E.D.visit. Pt very SOB/wheezy over phone.  Reports no swelling/wt gain.   Advised pt to go to E.D. to be evaluated and possible receive some IV Lasix to try and get some extra fluid off and improve breathing. (discussed w/ [place]) Pt is agreeable to plan and waiting for son to come from [place] to pick him up and bring him to [facility].  Pt thanks me for talking with him.

## 2017-02-23 NOTE — ED Triage Notes (Addendum)
Pt scheduled for cardiac cath on Monday.  Reports increased sob over the last 3 days.  Seen at [facility] in [facility] and was told he had fluid around his heart and was sent to [facility] for labs.  Pt got in contact with Dr. Cleda Mccreedy and Dr. Burt Knack and was advised to come to ED.  Denies chest pain.  Denies known weight gain.

## 2017-02-24 ENCOUNTER — Emergency Department (HOSPITAL_COMMUNITY)
Admission: EM | Admit: 2017-02-24 | Discharge: 2017-02-24 | Disposition: A | Discharge status: Home / Self Care | Payer: Medicare Other | Attending: Emergency Medicine | Admitting: Emergency Medicine

## 2017-02-24 ENCOUNTER — Encounter: Payer: Self-pay | Admitting: *Deleted

## 2017-02-24 DIAGNOSIS — R0602 Shortness of breath: Secondary | ICD-10-CM

## 2017-02-24 DIAGNOSIS — R609 Edema, unspecified: Secondary | ICD-10-CM

## 2017-02-24 LAB — PROTIME-INR
INR: 1.91
Prothrombin Time: 22.1 seconds — ABNORMAL HIGH (ref 11.4–15.2)

## 2017-02-24 LAB — BRAIN NATRIURETIC PEPTIDE: B Natriuretic Peptide: 585.6 pg/mL — ABNORMAL HIGH (ref 0.0–100.0)
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
## 2017-02-24 NOTE — Progress Notes (Signed)
EPIC Encounter for ICM Monitoring  Patient Name: Cody Gomez is a 83 y.o. male Date: 02/24/2017 Primary Care Physican: Cyndy Freeze, MD Primary [facility] Electrophysiologist: Faustino Congress Weight:unknown      Transmission reviewed.   Patient currently in ER per Epic notes.   Patient was seen in [facility] physician office on 02/23/2017 due to SOB and BNP was 1500.     Thoracic impedance normal but was abnormal suggesting fluid accumulation on 02/19/2017 and 02/20/2017.  Prescribed dosage: Furosemide 40 mg every other day  Labs: 02/23/2017 Creatinine 0.91, BUN 15, Potassium 4.0, Sodium 140, EGFR >60 02/18/2017 Creatinine 1.00, BUN 17, Potassium 4.5, Sodium 138, EGFR 69-80  01/20/2017 Creatinine 0.94, BUN 16, Potassium 3.8, Sodium 138, EGFR 75-86  01/19/2017 Creatinine 0.87, BUN 18, Potassium 4.7, Sodium 140   Recommendations:  None  Follow-up plan: ICM clinic phone appointment on 03/10/2017 to recheck fluid levels.  Copy of ICM check sent to primary cardiologist and device physician.   3 month ICM trend: 02/22/2017   1 Year ICM trend:      Rosalene Billings, RN 02/24/2017 1:58 PM

## 2017-02-24 NOTE — ED Notes (Signed)
Lab is adding on PT/INR and BNP

## 2017-02-25 ENCOUNTER — Telehealth: Payer: Self-pay | Admitting: Internal Medicine

## 2017-02-25 NOTE — Telephone Encounter (Signed)
I called and spoke with the patient and advised him of his cath instructions for Monday 3/19 with Dr. Burt Knack. Patient verbalized understanding.

## 2017-02-28 ENCOUNTER — Encounter (HOSPITAL_COMMUNITY): Payer: Self-pay | Admitting: Cardiovascular Disease

## 2017-02-28 ENCOUNTER — Ambulatory Visit (HOSPITAL_COMMUNITY)
Admission: RE | Admit: 2017-02-28 | Discharge: 2017-02-28 | Disposition: A | Discharge status: Home / Self Care | Payer: Medicare Other | Source: Ambulatory Visit | Attending: Cardiovascular Disease | Admitting: Cardiovascular Disease

## 2017-02-28 ENCOUNTER — Encounter (HOSPITAL_COMMUNITY)
Admission: RE | Disposition: A | Discharge status: Home / Self Care | Payer: Self-pay | Source: Ambulatory Visit | Attending: Cardiovascular Disease

## 2017-02-28 DIAGNOSIS — Z880 Allergy status to penicillin: Secondary | ICD-10-CM | POA: Insufficient documentation

## 2017-02-28 DIAGNOSIS — Z7951 Long term (current) use of inhaled steroids: Secondary | ICD-10-CM | POA: Insufficient documentation

## 2017-02-28 DIAGNOSIS — I5023 Acute on chronic systolic (congestive) heart failure: Secondary | ICD-10-CM | POA: Diagnosis not present

## 2017-02-28 DIAGNOSIS — J45909 Unspecified asthma, uncomplicated: Secondary | ICD-10-CM | POA: Insufficient documentation

## 2017-02-28 DIAGNOSIS — G4733 Obstructive sleep apnea (adult) (pediatric): Secondary | ICD-10-CM | POA: Insufficient documentation

## 2017-02-28 DIAGNOSIS — I442 Atrioventricular block, complete: Secondary | ICD-10-CM | POA: Insufficient documentation

## 2017-02-28 DIAGNOSIS — I480 Paroxysmal atrial fibrillation: Secondary | ICD-10-CM | POA: Insufficient documentation

## 2017-02-28 DIAGNOSIS — I2510 Atherosclerotic heart disease of native coronary artery without angina pectoris: Secondary | ICD-10-CM | POA: Diagnosis not present

## 2017-02-28 DIAGNOSIS — E785 Hyperlipidemia, unspecified: Secondary | ICD-10-CM | POA: Diagnosis not present

## 2017-02-28 DIAGNOSIS — F329 Major depressive disorder, single episode, unspecified: Secondary | ICD-10-CM | POA: Diagnosis not present

## 2017-02-28 DIAGNOSIS — Z8546 Personal history of malignant neoplasm of prostate: Secondary | ICD-10-CM | POA: Insufficient documentation

## 2017-02-28 DIAGNOSIS — Z95810 Presence of automatic (implantable) cardiac defibrillator: Secondary | ICD-10-CM | POA: Insufficient documentation

## 2017-02-28 DIAGNOSIS — Z85828 Personal history of other malignant neoplasm of skin: Secondary | ICD-10-CM | POA: Insufficient documentation

## 2017-02-28 DIAGNOSIS — I420 Dilated cardiomyopathy: Secondary | ICD-10-CM | POA: Insufficient documentation

## 2017-02-28 DIAGNOSIS — E0590 Thyrotoxicosis, unspecified without thyrotoxic crisis or storm: Secondary | ICD-10-CM | POA: Insufficient documentation

## 2017-02-28 DIAGNOSIS — K219 Gastro-esophageal reflux disease without esophagitis: Secondary | ICD-10-CM | POA: Diagnosis not present

## 2017-02-28 DIAGNOSIS — I110 Hypertensive heart disease with heart failure: Secondary | ICD-10-CM | POA: Insufficient documentation

## 2017-02-28 DIAGNOSIS — I252 Old myocardial infarction: Secondary | ICD-10-CM | POA: Insufficient documentation

## 2017-02-28 DIAGNOSIS — I472 Ventricular tachycardia, unspecified: Secondary | ICD-10-CM

## 2017-02-28 DIAGNOSIS — M545 Low back pain: Secondary | ICD-10-CM | POA: Diagnosis not present

## 2017-02-28 DIAGNOSIS — G8929 Other chronic pain: Secondary | ICD-10-CM | POA: Insufficient documentation

## 2017-02-28 DIAGNOSIS — Z79899 Other long term (current) drug therapy: Secondary | ICD-10-CM | POA: Insufficient documentation

## 2017-02-28 DIAGNOSIS — Z7901 Long term (current) use of anticoagulants: Secondary | ICD-10-CM | POA: Diagnosis not present

## 2017-02-28 HISTORY — PX: RIGHT/LEFT HEART CATH AND CORONARY ANGIOGRAPHY: CATH118266

## 2017-02-28 LAB — PROTIME-INR
INR: 1.12
Prothrombin Time: 14.5 seconds (ref 11.4–15.2)

## 2017-02-28 SURGERY — RIGHT/LEFT HEART CATH AND CORONARY ANGIOGRAPHY
Anesthesia: LOCAL

## 2017-02-28 MED ORDER — HEPARIN (PORCINE) IN NACL 2-0.9 UNIT/ML-% IJ SOLN
INTRAMUSCULAR | Status: DC | PRN
  Administered 2017-02-28: 1000 mL

## 2017-02-28 MED ORDER — LIDOCAINE HCL (PF) 1 % IJ SOLN
INTRAMUSCULAR | Status: DC | PRN
  Administered 2017-02-28 (×2): 2 mL

## 2017-02-28 MED ORDER — HEPARIN SODIUM (PORCINE) 1000 UNIT/ML IJ SOLN
INTRAMUSCULAR | Status: AC
  Filled 2017-02-28: qty 1

## 2017-02-28 MED ORDER — SODIUM CHLORIDE 0.9% FLUSH: 3.0000 mL | Freq: Two times a day (BID) | INTRAVENOUS | Status: DC

## 2017-02-28 MED ORDER — ASPIRIN 81 MG PO CHEW
81.0000 mg | CHEWABLE_TABLET | ORAL | Status: AC
  Administered 2017-02-28: 81 mg via ORAL

## 2017-02-28 MED ORDER — MIDAZOLAM HCL 2 MG/2ML IJ SOLN
INTRAMUSCULAR | Status: AC
  Filled 2017-02-28: qty 2

## 2017-02-28 MED ORDER — SODIUM CHLORIDE 0.9 % IV SOLN: 250.0000 mL | INTRAVENOUS | Status: DC | PRN

## 2017-02-28 MED ORDER — MIDAZOLAM HCL 2 MG/2ML IJ SOLN
INTRAMUSCULAR | Status: DC | PRN
  Administered 2017-02-28 (×2): 1 mg via INTRAVENOUS

## 2017-02-28 MED ORDER — IOPAMIDOL (ISOVUE-370) INJECTION 76%
INTRAVENOUS | Status: DC | PRN
  Administered 2017-02-28: 60 mL via INTRA_ARTERIAL

## 2017-02-28 MED ORDER — HEPARIN (PORCINE) IN NACL 2-0.9 UNIT/ML-% IJ SOLN
INTRAMUSCULAR | Status: AC
  Filled 2017-02-28: qty 1000

## 2017-02-28 MED ORDER — ONDANSETRON HCL 4 MG/2ML IJ SOLN: 4.0000 mg | Freq: Four times a day (QID) | INTRAMUSCULAR | Status: DC | PRN

## 2017-02-28 MED ORDER — SODIUM CHLORIDE 0.9 % IV SOLN: INTRAVENOUS | Status: DC

## 2017-02-28 MED ORDER — VERAPAMIL HCL 2.5 MG/ML IV SOLN
INTRAVENOUS | Status: AC
  Filled 2017-02-28: qty 2

## 2017-02-28 MED ORDER — ACETAMINOPHEN 325 MG PO TABS: 650.0000 mg | ORAL_TABLET | ORAL | Status: DC | PRN

## 2017-02-28 MED ORDER — HEPARIN (PORCINE) IN NACL 2-0.9 UNIT/ML-% IJ SOLN
INTRAMUSCULAR | Status: DC | PRN
  Administered 2017-02-28: 10 mL via INTRA_ARTERIAL

## 2017-02-28 MED ORDER — ASPIRIN 81 MG PO CHEW
CHEWABLE_TABLET | ORAL | Status: AC
  Administered 2017-02-28: 81 mg via ORAL
  Filled 2017-02-28: qty 1

## 2017-02-28 MED ORDER — SODIUM CHLORIDE 0.9 % WEIGHT BASED INFUSION: 1.0000 mL/kg/h | INTRAVENOUS | Status: DC

## 2017-02-28 MED ORDER — FENTANYL CITRATE (PF) 100 MCG/2ML IJ SOLN
INTRAMUSCULAR | Status: DC | PRN
  Administered 2017-02-28 (×2): 50 ug via INTRAVENOUS

## 2017-02-28 MED ORDER — SODIUM CHLORIDE 0.9% FLUSH: 3.0000 mL | INTRAVENOUS | Status: DC | PRN

## 2017-02-28 MED ORDER — LIDOCAINE HCL (PF) 1 % IJ SOLN
INTRAMUSCULAR | Status: AC
  Filled 2017-02-28: qty 30

## 2017-02-28 MED ORDER — SODIUM CHLORIDE 0.9 % WEIGHT BASED INFUSION
3.0000 mL/kg/h | INTRAVENOUS | Status: AC
  Administered 2017-02-28: 3 mL/kg/h via INTRAVENOUS

## 2017-02-28 MED ORDER — HEPARIN SODIUM (PORCINE) 1000 UNIT/ML IJ SOLN
INTRAMUSCULAR | Status: DC | PRN
  Administered 2017-02-28: 5000 [IU] via INTRAVENOUS

## 2017-02-28 MED ORDER — FENTANYL CITRATE (PF) 100 MCG/2ML IJ SOLN
INTRAMUSCULAR | Status: AC
  Filled 2017-02-28: qty 2

## 2017-02-28 SURGICAL SUPPLY — 12 items
CATH 5FR JL3.5 JR4 ANG PIG MP (CATHETERS) ×1 IMPLANT
CATH BALLN WEDGE 5F 110CM (CATHETERS) ×1 IMPLANT
DEVICE RAD COMP TR BAND LRG (VASCULAR PRODUCTS) ×1 IMPLANT
GLIDESHEATH SLEND SS 6F .021 (SHEATH) ×1 IMPLANT
GUIDEWIRE .025 260CM (WIRE) ×1 IMPLANT
GUIDEWIRE INQWIRE 1.5J.035X260 (WIRE) IMPLANT
INQWIRE 1.5J .035X260CM (WIRE) ×2
KIT HEART LEFT (KITS) ×2 IMPLANT
PACK CARDIAC CATHETERIZATION (CUSTOM PROCEDURE TRAY) ×2 IMPLANT
SHEATH FAST CATH BRACH 5F 5CM (SHEATH) ×1 IMPLANT
TRANSDUCER W/STOPCOCK (MISCELLANEOUS) ×2 IMPLANT
TUBING CIL FLEX 10 FLL-RA (TUBING) ×2 IMPLANT

## 2017-02-28 NOTE — Discharge Instructions (Signed)
Radial Site Care °Refer to this sheet in the next few weeks. These instructions provide you with information about caring for yourself after your procedure. Your health care provider may also give you more specific instructions. Your treatment has been planned according to current medical practices, but problems sometimes occur. Call your health care provider if you have any problems or questions after your procedure. °What can I expect after the procedure? °After your procedure, it is typical to have the following: °· Bruising at the radial site that usually fades within 1-2 weeks. °· Blood collecting in the tissue (hematoma) that may be painful to the touch. It should usually decrease in size and tenderness within 1-2 weeks. °Follow these instructions at home: °· Take medicines only as directed by your health care provider. °· You may shower 24-48 hours after the procedure or as directed by your health care provider. Remove the bandage (dressing) and gently wash the site with plain soap and water. Pat the area dry with a clean towel. Do not rub the site, because this may cause bleeding. °· Do not take baths, swim, or use a hot tub until your health care provider approves. °· Check your insertion site every day for redness, swelling, or drainage. °· Do not apply powder or lotion to the site. °· Do not flex or bend the affected arm for 24 hours or as directed by your health care provider. °· Do not push or pull heavy objects with the affected arm for 24 hours or as directed by your health care provider. °· Do not lift over 10 lb (4.5 kg) for 5 days after your procedure or as directed by your health care provider. °· Ask your health care provider when it is okay to: °¨ Return to work or school. °¨ Resume usual physical activities or sports. °¨ Resume sexual activity. °· Do not drive home if you are discharged the same day as the procedure. Have someone else drive you. °· You may drive 24 hours after the procedure  unless otherwise instructed by your health care provider. °· Do not operate machinery or power tools for 24 hours after the procedure. °· If your procedure was done as an outpatient procedure, which means that you went home the same day as your procedure, a responsible adult should be with you for the first 24 hours after you arrive home. °· Keep all follow-up visits as directed by your health care provider. This is important. °Contact a health care provider if: °· You have a fever. °· You have chills. °· You have increased bleeding from the radial site. Hold pressure on the site. CALL 911 °Get help right away if: °· You have unusual pain at the radial site. °· You have redness, warmth, or swelling at the radial site. °· You have drainage (other than a small amount of blood on the dressing) from the radial site. °· The radial site is bleeding, and the bleeding does not stop after 30 minutes of holding steady pressure on the site. °· Your arm or hand becomes pale, cool, tingly, or numb. °This information is not intended to replace advice given to you by your health care provider. Make sure you discuss any questions you have with your health care provider. °Document Released: 01/01/2011 Document Revised: 05/06/2016 Document Reviewed: 06/17/2014 °Elsevier Interactive Patient Education © 2017 [employer] ° ° °

## 2017-02-28 NOTE — Interval H&P Note (Signed)
History and Physical Interval Note:  02/28/2017 10:26 AM  Cody Gomez  has presented today for surgery, with the diagnosis of abnormal echo  The various methods of treatment have been discussed with the patient and family. After consideration of risks, benefits and other options for treatment, the patient has consented to  Procedure(s): Right/Left Heart Cath and Coronary Angiography (N/A) as a surgical intervention .  The patient's history has been reviewed, patient examined, no change in status, stable for surgery.  I have reviewed the patient's chart and labs.  Questions were answered to the patient's satisfaction.     [name]
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
## 2017-03-01 LAB — POCT I-STAT 3, ART BLOOD GAS (G3+)
Acid-Base Excess: 3 mmol/L — ABNORMAL HIGH (ref 0.0–2.0)
BICARBONATE: 26.8 mmol/L (ref 20.0–28.0)
O2 Saturation: 98 %
PO2 ART: 94 mmHg (ref 83.0–108.0)
TCO2: 28 mmol/L (ref 0–100)
pCO2 arterial: 37.3 mmHg (ref 32.0–48.0)
pH, Arterial: 7.465 — ABNORMAL HIGH (ref 7.350–7.450)

## 2017-03-01 LAB — POCT I-STAT 3, VENOUS BLOOD GAS (G3P V)
BICARBONATE: 25.7 mmol/L (ref 20.0–28.0)
O2 SAT: 67 %
PCO2 VEN: 43.2 mmHg — AB (ref 44.0–60.0)
PH VEN: 7.382 (ref 7.250–7.430)
TCO2: 27 mmol/L (ref 0–100)
pO2, Ven: 36 mmHg (ref 32.0–45.0)

## 2017-03-10 ENCOUNTER — Telehealth: Payer: Self-pay

## 2017-03-10 ENCOUNTER — Ambulatory Visit (INDEPENDENT_AMBULATORY_CARE_PROVIDER_SITE_OTHER): Payer: Medicare Other

## 2017-03-10 DIAGNOSIS — Z7901 Long term (current) use of anticoagulants: Secondary | ICD-10-CM | POA: Diagnosis not present

## 2017-03-10 DIAGNOSIS — I5022 Chronic systolic (congestive) heart failure: Secondary | ICD-10-CM

## 2017-03-10 DIAGNOSIS — Z95810 Presence of automatic (implantable) cardiac defibrillator: Secondary | ICD-10-CM

## 2017-03-10 DIAGNOSIS — I420 Dilated cardiomyopathy: Secondary | ICD-10-CM | POA: Diagnosis not present

## 2017-03-10 DIAGNOSIS — H1032 Unspecified acute conjunctivitis, left eye: Secondary | ICD-10-CM | POA: Diagnosis not present

## 2017-03-10 DIAGNOSIS — E0590 Thyrotoxicosis, unspecified without thyrotoxic crisis or storm: Secondary | ICD-10-CM | POA: Diagnosis not present

## 2017-03-10 DIAGNOSIS — G4733 Obstructive sleep apnea (adult) (pediatric): Secondary | ICD-10-CM | POA: Diagnosis not present

## 2017-03-10 NOTE — Telephone Encounter (Signed)
Remote ICM transmission received.  Attempted patient call and left message to return call.   

## 2017-03-10 NOTE — Progress Notes (Addendum)
EPIC Encounter for ICM Monitoring  Patient Name: Cody Gomez is a 83 y.o. male Date: 03/10/2017 Primary Care Physican: Cyndy Freeze, MD Primary [place] Electrophysiologist: Faustino Congress Weight:unknown        Clinical Status Since 22-February-2017 Treated VF 0 FVT 0 VT 0 Monitored VT (120-128 bpm) 0 VT-NS (>4 beats, >128 bpm) 309       Attempted call to patient and unable to reach.  Left message to return call.  Transmission reviewed.    Thoracic impedance normal.  Prescribed dosage: Furosemide 40 mg every other day  Labs: 02/23/2017 Creatinine 0.91, BUN 15, Potassium 4.0, Sodium 140, EGFR >60 02/18/2017 Creatinine 1.00, BUN 17, Potassium 4.5, Sodium 138, EGFR 69-80  01/20/2017 Creatinine 0.94, BUN 16, Potassium 3.8, Sodium 138, EGFR 75-86  01/19/2017 Creatinine 0.87, BUN 18, Potassium 4.7, Sodium 140   Recommendations: NONE - Unable to reach patient   Follow-up plan: ICM clinic phone appointment on 04/12/2017.  Defib office check with Dr [name] 03/28/2017  Copy of ICM check sent to device physician.   3 month ICM trend: 03/10/2017   1 Year ICM trend:      Rosalene Billings, RN 03/10/2017 8:19 AM

## 2017-03-10 NOTE — Progress Notes (Signed)
Patient returned call.  He stated he is feeling better today than he has been in the last week.  He does have continued overall weakness.  Denied any fluid symptoms.  Next ICM remote transmission 04/12/2017

## 2017-03-14 ENCOUNTER — Telehealth: Payer: Self-pay | Admitting: Internal Medicine

## 2017-03-14 DIAGNOSIS — H00026 Hordeolum internum left eye, unspecified eyelid: Secondary | ICD-10-CM | POA: Diagnosis not present

## 2017-03-14 NOTE — Telephone Encounter (Signed)
I do not find faxed lab results--I called Dr Leonette Most office and was told both Anderson Malta and Dr Cathi Roan were off today.

## 2017-03-14 NOTE — Telephone Encounter (Signed)
New message      Dr [name] took pt off his thyroid medication.  Dr Cathi Roan at [facility] is faxing most recent thyroid results to Dr [name] because it appears that his thyroid level is increasing. Please advise

## 2017-03-16 ENCOUNTER — Encounter: Payer: Self-pay | Admitting: Internal Medicine

## 2017-03-16 NOTE — Progress Notes (Signed)
Spoke wi PCP   Labs reveal recurrent hyperthyroidism off methimazole He will follow up with endocrine

## 2017-03-16 NOTE — Telephone Encounter (Signed)
Labs received. Dr. [name] called and spoke with Dr. Cathi Roan today about managing this.

## 2017-03-18 DIAGNOSIS — Z7901 Long term (current) use of anticoagulants: Secondary | ICD-10-CM | POA: Diagnosis not present

## 2017-03-24 ENCOUNTER — Telehealth: Payer: Self-pay | Admitting: *Deleted

## 2017-03-24 NOTE — Telephone Encounter (Signed)
Error

## 2017-03-25 DIAGNOSIS — Z7901 Long term (current) use of anticoagulants: Secondary | ICD-10-CM | POA: Diagnosis not present

## 2017-03-26 ENCOUNTER — Other Ambulatory Visit: Payer: Self-pay | Admitting: Internal Medicine

## 2017-03-28 ENCOUNTER — Encounter: Payer: Self-pay | Admitting: Internal Medicine

## 2017-03-28 ENCOUNTER — Encounter: Payer: Self-pay | Admitting: *Deleted

## 2017-03-28 ENCOUNTER — Ambulatory Visit (INDEPENDENT_AMBULATORY_CARE_PROVIDER_SITE_OTHER): Payer: Medicare Other | Admitting: Internal Medicine

## 2017-03-28 ENCOUNTER — Encounter (INDEPENDENT_AMBULATORY_CARE_PROVIDER_SITE_OTHER): Payer: Self-pay

## 2017-03-28 VITALS — BP 126/74 | HR 80 | Ht 67.0 in | Wt 202.0 lb

## 2017-03-28 DIAGNOSIS — Z01812 Encounter for preprocedural laboratory examination: Secondary | ICD-10-CM

## 2017-03-28 DIAGNOSIS — Z95810 Presence of automatic (implantable) cardiac defibrillator: Secondary | ICD-10-CM

## 2017-03-28 DIAGNOSIS — I472 Ventricular tachycardia, unspecified: Secondary | ICD-10-CM

## 2017-03-28 DIAGNOSIS — I480 Paroxysmal atrial fibrillation: Secondary | ICD-10-CM | POA: Diagnosis not present

## 2017-03-28 DIAGNOSIS — Z79899 Other long term (current) drug therapy: Secondary | ICD-10-CM

## 2017-03-28 DIAGNOSIS — I428 Other cardiomyopathies: Secondary | ICD-10-CM | POA: Diagnosis not present

## 2017-03-28 DIAGNOSIS — I5022 Chronic systolic (congestive) heart failure: Secondary | ICD-10-CM | POA: Diagnosis not present
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
## 2017-03-28 NOTE — Patient Instructions (Signed)
Medication Instructions: - Your physician recommends that you continue on your current medications as directed. Please refer to the Current Medication list given to you today.  Labwork: - Your physician recommends that you return for lab work :Tuesday 04/19/17  Procedures/Testing: - Your physician has recommended that you have a Bi-V defibrillator upgrade.  Follow-Up: - Your physician recommends that you schedule a follow-up appointment in: about 14 days (from 04/25/17) with the device clinic for a wound check   Any Additional Special Instructions Will Be Listed Below (If Applicable).     If you need a refill on your cardiac medications before your next appointment, please call your pharmacy.

## 2017-03-30 DIAGNOSIS — R0602 Shortness of breath: Secondary | ICD-10-CM | POA: Diagnosis not present

## 2017-03-30 DIAGNOSIS — J302 Other seasonal allergic rhinitis: Secondary | ICD-10-CM | POA: Diagnosis not present

## 2017-03-30 DIAGNOSIS — R062 Wheezing: Secondary | ICD-10-CM | POA: Diagnosis not present

## 2017-03-30 DIAGNOSIS — R0600 Dyspnea, unspecified: Secondary | ICD-10-CM | POA: Diagnosis not present

## 2017-04-02 DIAGNOSIS — J45909 Unspecified asthma, uncomplicated: Secondary | ICD-10-CM | POA: Diagnosis not present

## 2017-04-08 DIAGNOSIS — Z7901 Long term (current) use of anticoagulants: Secondary | ICD-10-CM | POA: Diagnosis not present

## 2017-04-12 ENCOUNTER — Ambulatory Visit (INDEPENDENT_AMBULATORY_CARE_PROVIDER_SITE_OTHER): Payer: Medicare Other

## 2017-04-12 DIAGNOSIS — Z95810 Presence of automatic (implantable) cardiac defibrillator: Secondary | ICD-10-CM | POA: Diagnosis not present

## 2017-04-12 DIAGNOSIS — I5022 Chronic systolic (congestive) heart failure: Secondary | ICD-10-CM | POA: Diagnosis not present

## 2017-04-14 NOTE — Progress Notes (Signed)
EPIC Encounter for ICM Monitoring  Patient Name: Cody Gomez is a 83 y.o. male Date: 04/14/2017 Primary Care Physican: Cyndy Freeze, MD Primary [place] Electrophysiologist: Faustino Congress Weight:unknown      Heart Failure questions reviewed, pt asymptomatic for fluid symptoms.  He is still feeling weak but hopes that the device change on 5/14 will help feel stronger.    Thoracic impedance normal.  Prescribed dosage: Furosemide 20 mg every day  Labs: 02/23/2017 Creatinine 0.91, BUN 15, Potassium 4.0, Sodium 140, EGFR >60 02/18/2017 Creatinine 1.00, BUN 17, Potassium 4.5, Sodium 138, EGFR 69-80  01/20/2017 Creatinine 0.94, BUN 16, Potassium 3.8, Sodium 138, EGFR 75-86  01/19/2017 Creatinine 0.87, BUN 18, Potassium 4.7, Sodium 140   Recommendations: Patient scheduled for upgrade to BiV defibrillator placement on 04/25/2017 by Dr [name].  No changes. Discussed how too much salt effects the heart.   Encouraged to call for fluid symptoms or use local emergency room for any urgent symptoms.  Follow-up plan: ICM clinic phone appointment on 06/09/2017 to give time for Optivol thoracic impedance to develop baseline.  Wound check in the office appointment scheduled on 05/11/2017 with device clinic.  Copy of ICM check sent to device physician.   3 month ICM trend: 04/12/2017   1 Year ICM trend:      Rosalene Billings, RN 04/14/2017 10:31 AM

## 2017-04-18 ENCOUNTER — Telehealth: Payer: Self-pay

## 2017-04-18 NOTE — Telephone Encounter (Signed)
Patient called requesting different appointment time for scheduled labs on 04/19/2017.  He wanted to schedule for 3pm instead of 10 am due to transportation issues.  Appointment time changed to 3pm as requested.

## 2017-04-19 ENCOUNTER — Other Ambulatory Visit: Payer: Medicare Other

## 2017-04-19 DIAGNOSIS — Z01812 Encounter for preprocedural laboratory examination: Secondary | ICD-10-CM

## 2017-04-19 DIAGNOSIS — I5022 Chronic systolic (congestive) heart failure: Secondary | ICD-10-CM | POA: Diagnosis not present

## 2017-04-19 DIAGNOSIS — I472 Ventricular tachycardia, unspecified: Secondary | ICD-10-CM

## 2017-04-19 DIAGNOSIS — I428 Other cardiomyopathies: Secondary | ICD-10-CM

## 2017-04-19 DIAGNOSIS — I480 Paroxysmal atrial fibrillation: Secondary | ICD-10-CM

## 2017-04-20 LAB — BASIC METABOLIC PANEL
BUN / CREAT RATIO: 23 (ref 10–24)
BUN: 21 mg/dL (ref 8–27)
CHLORIDE: 102 mmol/L (ref 96–106)
CO2: 24 mmol/L (ref 18–29)
CREATININE: 0.92 mg/dL (ref 0.76–1.27)
Calcium: 9.2 mg/dL (ref 8.6–10.2)
GFR calc Af Amer: 89 mL/min/{1.73_m2} (ref >59)
GFR calc non Af Amer: 77 mL/min/{1.73_m2} (ref >59)
GLUCOSE: 95 mg/dL (ref 65–99)
Potassium: 4.6 mmol/L (ref 3.5–5.2)
SODIUM: 141 mmol/L (ref 134–144)

## 2017-04-20 LAB — CBC WITH DIFFERENTIAL/PLATELET
BASOS ABS: 0 10*3/uL (ref 0.0–0.2)
Basos: 0 %
EOS (ABSOLUTE): 0 10*3/uL (ref 0.0–0.4)
EOS: 0 %
Hematocrit: 43.1 % (ref 37.5–51.0)
Hemoglobin: 14.7 g/dL (ref 13.0–17.7)
IMMATURE GRANULOCYTES: 0 %
Immature Grans (Abs): 0 10*3/uL (ref 0.0–0.1)
LYMPHS: 14 %
Lymphocytes Absolute: 1.2 10*3/uL (ref 0.7–3.1)
MCH: 29.8 pg (ref 26.6–33.0)
MCHC: 34.1 g/dL (ref 31.5–35.7)
MCV: 87 fL (ref 79–97)
MONOS ABS: 0.6 10*3/uL (ref 0.1–0.9)
Monocytes: 7 %
NEUTROS PCT: 79 %
Neutrophils Absolute: 6.6 10*3/uL (ref 1.4–7.0)
PLATELETS: 249 10*3/uL (ref 150–379)
RBC: 4.94 x10E6/uL (ref 4.14–5.80)
RDW: 14.7 % (ref 12.3–15.4)
WBC: 8.4 10*3/uL (ref 3.4–10.8)

## 2017-04-20 LAB — PROTIME-INR
INR: 2.5 — AB (ref 0.8–1.2)
PROTHROMBIN TIME: 24.9 s — AB (ref 9.1–12.0)

## 2017-04-25 ENCOUNTER — Encounter (HOSPITAL_COMMUNITY)
Admission: RE | Disposition: A | Discharge status: Home / Self Care | Payer: Self-pay | Source: Ambulatory Visit | Attending: Internal Medicine

## 2017-04-25 ENCOUNTER — Ambulatory Visit (INDEPENDENT_AMBULATORY_CARE_PROVIDER_SITE_OTHER)
Admit: 2017-04-25 | Discharge: 2017-04-25 | Disposition: A | Discharge status: Home / Self Care | Payer: Medicare Other | Attending: Internal Medicine | Admitting: Internal Medicine

## 2017-04-25 ENCOUNTER — Encounter (HOSPITAL_COMMUNITY): Payer: Self-pay | Admitting: Internal Medicine

## 2017-04-25 ENCOUNTER — Ambulatory Visit (HOSPITAL_COMMUNITY)
Admission: RE | Admit: 2017-04-25 | Discharge: 2017-04-25 | Disposition: A | Discharge status: Home / Self Care | Payer: Medicare Other | Source: Ambulatory Visit | Attending: Internal Medicine | Admitting: Internal Medicine

## 2017-04-25 DIAGNOSIS — G4733 Obstructive sleep apnea (adult) (pediatric): Secondary | ICD-10-CM | POA: Diagnosis not present

## 2017-04-25 DIAGNOSIS — Z95810 Presence of automatic (implantable) cardiac defibrillator: Secondary | ICD-10-CM | POA: Insufficient documentation

## 2017-04-25 DIAGNOSIS — I110 Hypertensive heart disease with heart failure: Secondary | ICD-10-CM | POA: Diagnosis not present

## 2017-04-25 DIAGNOSIS — E785 Hyperlipidemia, unspecified: Secondary | ICD-10-CM | POA: Diagnosis not present

## 2017-04-25 DIAGNOSIS — I472 Ventricular tachycardia, unspecified: Secondary | ICD-10-CM

## 2017-04-25 DIAGNOSIS — I5020 Unspecified systolic (congestive) heart failure: Secondary | ICD-10-CM | POA: Insufficient documentation

## 2017-04-25 DIAGNOSIS — I871 Compression of vein: Secondary | ICD-10-CM | POA: Diagnosis not present

## 2017-04-25 DIAGNOSIS — F329 Major depressive disorder, single episode, unspecified: Secondary | ICD-10-CM | POA: Diagnosis not present

## 2017-04-25 DIAGNOSIS — I82603 Acute embolism and thrombosis of unspecified veins of upper extremity, bilateral: Secondary | ICD-10-CM | POA: Diagnosis not present

## 2017-04-25 DIAGNOSIS — J45909 Unspecified asthma, uncomplicated: Secondary | ICD-10-CM | POA: Insufficient documentation

## 2017-04-25 DIAGNOSIS — Z880 Allergy status to penicillin: Secondary | ICD-10-CM | POA: Diagnosis not present

## 2017-04-25 DIAGNOSIS — I420 Dilated cardiomyopathy: Secondary | ICD-10-CM | POA: Diagnosis not present

## 2017-04-25 DIAGNOSIS — E039 Hypothyroidism, unspecified: Secondary | ICD-10-CM | POA: Insufficient documentation

## 2017-04-25 DIAGNOSIS — I442 Atrioventricular block, complete: Secondary | ICD-10-CM | POA: Diagnosis not present

## 2017-04-25 DIAGNOSIS — I252 Old myocardial infarction: Secondary | ICD-10-CM | POA: Diagnosis not present

## 2017-04-25 DIAGNOSIS — I255 Ischemic cardiomyopathy: Secondary | ICD-10-CM | POA: Insufficient documentation

## 2017-04-25 DIAGNOSIS — Z7901 Long term (current) use of anticoagulants: Secondary | ICD-10-CM | POA: Insufficient documentation

## 2017-04-25 DIAGNOSIS — I480 Paroxysmal atrial fibrillation: Secondary | ICD-10-CM | POA: Diagnosis not present

## 2017-04-25 DIAGNOSIS — K219 Gastro-esophageal reflux disease without esophagitis: Secondary | ICD-10-CM | POA: Insufficient documentation

## 2017-04-25 HISTORY — PX: BIV UPGRADE: EP1202

## 2017-04-25 LAB — SURGICAL PCR SCREEN
MRSA, PCR: NEGATIVE
Staphylococcus aureus: NEGATIVE

## 2017-04-25 LAB — PROTIME-INR
INR: 1.75
Prothrombin Time: 20.7 seconds — ABNORMAL HIGH (ref 11.4–15.2)

## 2017-04-25 SURGERY — BIV UPGRADE

## 2017-04-25 MED ORDER — GENTAMICIN SULFATE 40 MG/ML IJ SOLN
INTRAMUSCULAR | Status: AC
  Filled 2017-04-25: qty 2

## 2017-04-25 MED ORDER — VANCOMYCIN HCL IN DEXTROSE 1-5 GM/200ML-% IV SOLN: 1000.0000 mg | INTRAVENOUS | Status: DC

## 2017-04-25 MED ORDER — LIDOCAINE HCL (PF) 1 % IJ SOLN
INTRAMUSCULAR | Status: AC
Start: 2017-04-25 — End: 2017-04-25
  Filled 2017-04-25: qty 60

## 2017-04-25 MED ORDER — IOPAMIDOL (ISOVUE-370) INJECTION 76%
INTRAVENOUS | Status: AC
  Filled 2017-04-25: qty 50

## 2017-04-25 MED ORDER — SODIUM CHLORIDE 0.9 % IV SOLN
INTRAVENOUS | Status: DC
  Administered 2017-04-25: 06:00:00 via INTRAVENOUS

## 2017-04-25 MED ORDER — MUPIROCIN 2 % EX OINT
TOPICAL_OINTMENT | Freq: Once | CUTANEOUS | Status: AC
  Administered 2017-04-25: 1 via NASAL
  Filled 2017-04-25: qty 22

## 2017-04-25 MED ORDER — VANCOMYCIN HCL IN DEXTROSE 1-5 GM/200ML-% IV SOLN
INTRAVENOUS | Status: AC
  Filled 2017-04-25: qty 200

## 2017-04-25 MED ORDER — SODIUM CHLORIDE 0.9 % IR SOLN: 80.0000 mg | Status: DC

## 2017-04-25 MED ORDER — IOPAMIDOL (ISOVUE-370) INJECTION 76%
INTRAVENOUS | Status: DC | PRN
  Administered 2017-04-25 (×2): 15 mL via INTRAVENOUS

## 2017-04-25 MED ORDER — IOPAMIDOL (ISOVUE-300) INJECTION 61%
80.0000 mL | Freq: Once | INTRAVENOUS | Status: AC | PRN
  Administered 2017-04-25: 80 mL via INTRAVENOUS

## 2017-04-25 MED ORDER — MUPIROCIN 2 % EX OINT
TOPICAL_OINTMENT | CUTANEOUS | Status: AC
  Filled 2017-04-25: qty 22

## 2017-04-25 SURGICAL SUPPLY — 3 items
CABLE SURGICAL S-101-97-12 (CABLE) ×2 IMPLANT
PAD DEFIB LIFELINK (PAD) ×2 IMPLANT
TRAY PACEMAKER INSERTION (PACKS) ×2 IMPLANT

## 2017-04-25 NOTE — Progress Notes (Signed)
Reviewed results with family B UE thrombosis raises issue of intrinsic 2/2 progressive thrombosis vs extrinsic 2/2 malignancy or some such  Will do Chest CT with contrast and have reviewed the plan with radiology

## 2017-04-25 NOTE — Interval H&P Note (Signed)
History and Physical Interval Note:  04/25/2017 7:50 AM  Cody Gomez  has presented today for surgery, with the diagnosis of cm - hf  The various methods of treatment have been discussed with the patient and family. After consideration of risks, benefits and other options for treatment, the patient has consented to  Procedure(s): BiV Upgrade (N/A) as a surgical intervention .  The patient's history has been reviewed, patient examined, no change in status, stable for surgery.  I have reviewed the patient's chart and labs.  Questions were answered to the patient's satisfaction.     Virl Axe  Here for CRT upgrade for presumed pacemaker cardiomyopathy and need for addressing the failing RIATA lead. He has a plethora of veins on his chest with distended R>L neck veins making me concerned about B venous obstruction If this turns out to be the case, then the options are 2 once that issue is addressed 1- epicardial lead placement 2) inside-out access from the leg and endovascular placement.  For both of these I would need assistance and we would defer this The RIATA issue needs to be addressed and the best approach if occlueded maybe extraction and reimplantation

## 2017-04-25 NOTE — Progress Notes (Signed)
Procedure cancelled per Dr. [name].  Family in to discuss options with pt. And family.
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
## 2017-04-26 ENCOUNTER — Other Ambulatory Visit: Payer: Self-pay | Admitting: Internal Medicine

## 2017-04-26 NOTE — Telephone Encounter (Signed)
Medication Detail    Disp Refills Start End   mexiletine (MEXITIL) 150 MG capsule 60 capsule 10 03/28/2017    Sig: TAKE 1 CAPSULE(150 MG) BY MOUTH TWICE DAILY   E-Prescribing Status: Receipt confirmed by pharmacy (03/28/2017 9:55 AM EDT)   Pharmacy   [place] [id number] [place]

## 2017-04-28 ENCOUNTER — Telehealth: Payer: Self-pay

## 2017-04-28 ENCOUNTER — Encounter: Payer: Self-pay | Admitting: Internal Medicine

## 2017-04-28 NOTE — Telephone Encounter (Signed)
I called and spoke with the patient. I advised him that per Dr. [name], the CT scan he had done is showing that there is a very large goiter that is compressing the veins on the right side that could allow access for his device upgrade. He is also aware that Dr. [name] has spoken with Dr. Harlow Asa (general surgery) and Dr. Gala Lewandowsky office is to call him about a arranging an appointment to discuss options for this. I have advised him if he does not hear from Dr. Gala Lewandowsky office tomorrow to call Monday and let us know.  He voices understanding.

## 2017-04-28 NOTE — Progress Notes (Signed)
Spoke with Dr.Yamagata who recommended I discussed this with Dr. Harlow Asa which I have done. There is clear obstruction to subclavian flow. The right sided inflows are harder to determine because the contrast was given in the left arm. However, there is evidence of right facial chest wall collateralization and in conjunction with the venogram taken in the EP lab supports high-grade venous obstruction although probably not thrombosis. It turns out there is also a very large goiter which is compressing the venous structures as well as the trachea. Dr. Robyne Askew will arrange office evaluationin

## 2017-04-28 NOTE — Telephone Encounter (Signed)
Received voice mail from patient asking for return call.  He reported he is supposed to have a Chest CT Scan has not heard anything about the appointment. He asked for call back regarding the test.

## 2017-05-02 ENCOUNTER — Telehealth: Payer: Self-pay

## 2017-05-02 NOTE — Telephone Encounter (Signed)
Received call from patient today.  Attempted to call the office today to speak with Cody Gomez today but was not able to get through the phone system.  He has not received a call from Dr. Gala Lewandowsky office about an appointment to discuss his options regarding the CT scan that showed a goiter that is compressing the veins on the right side.  Heather told him to call back today if he has not received a call from that office.  Advised will forward message to [facility] but she is out of the office so it will be tomorrow before he receives a call back.  He stated that is fine it is not urgent.

## 2017-05-03 DIAGNOSIS — Z7901 Long term (current) use of anticoagulants: Secondary | ICD-10-CM | POA: Diagnosis not present

## 2017-05-03 NOTE — Telephone Encounter (Signed)
Spoke with Dr. [name], he texted Dr. Harlow Asa again today to follow up.

## 2017-05-06 ENCOUNTER — Telehealth: Payer: Self-pay | Admitting: Internal Medicine

## 2017-05-06 DIAGNOSIS — E049 Nontoxic goiter, unspecified: Secondary | ICD-10-CM | POA: Diagnosis not present

## 2017-05-06 DIAGNOSIS — E0590 Thyrotoxicosis, unspecified without thyrotoxic crisis or storm: Secondary | ICD-10-CM | POA: Diagnosis not present

## 2017-05-06 NOTE — Telephone Encounter (Signed)
Patient calling in regards to appt on 05-11-17 for a wound check. Patient states that he did not have surgery and he does not have wound. He would like to verify if he needs to keep appt. Thanks.

## 2017-05-06 NOTE — Telephone Encounter (Signed)
I spoke with the patient and advised him that he does not need to keep the wound check on 05/11/17.  He saw Dr. Harlow Asa today for his goiter and there are plans to have a procedure on this.  Per the patient Dr. Harlow Asa should touch base with Dr. [name].

## 2017-05-11 ENCOUNTER — Ambulatory Visit: Payer: Medicare Other

## 2017-05-13 ENCOUNTER — Telehealth: Payer: Self-pay | Admitting: *Deleted

## 2017-05-13 DIAGNOSIS — Z7901 Long term (current) use of anticoagulants: Secondary | ICD-10-CM | POA: Diagnosis not present

## 2017-05-13 NOTE — Telephone Encounter (Signed)
-----   Message from Leeroy Bock, [facility] sent at 05/12/2017 11:02 AM EDT ----- He should be fine to hold for 5 days without Lovenox bridge. We do not manage his Coumadin, would advise pt to call his managing MD so they are aware.  Megan  ----- Message ----- From: Emily Filbert, RN Sent: 05/12/2017   9:30 AM To: Megan E Supple, RPH  No rationale for that- just that he would probably require lovenox bridge, but I couldn't see why either. ----- Message ----- From: Leeroy Bock, [facility] Sent: 05/11/2017   8:15 AM To: Emily Filbert, RN, Erskine Emery, [facility]  Did they mention rationale for Lovenox? Upon reviewing patient's chart, I don't see any indication that he would require Lovenox. His CHADS2 score is only 3 and he has no hx of stroke or mechanical valve.   Megan  ----- Message ----- From: Emily Filbert, RN Sent: 05/10/2017   6:09 PM To: Emily Filbert, RN, Erskine Emery, [facility], #  Hey ladies,  Dr. [name] received a request for surgical clearance for this patient, which he addressed, but he was asking if you all could help with the warfarin piece of this. He is pending thyroid surgery with Dr. Harlow Asa (not scheduled yet)- they had mentioned a lovenox bridge?  Thanks for your help! Nira Conn

## 2017-05-13 NOTE — Telephone Encounter (Signed)
Clearance for thyroid surgery faxed to Dr. Harlow Asa with recommendations to hold warfarin for 5 days prior with no lovenox bridging.  Faxed to [phone]. Confirmation received.  I have notified the patient of the above and advised him to touch base with Dr. Leonette Most office regarding his coumadin- just to let them know he will be holding this for his surgery, once he has a date. The patient verbalizes understanding.

## 2017-05-18 ENCOUNTER — Ambulatory Visit: Payer: Self-pay | Admitting: Surgery

## 2017-05-18 DIAGNOSIS — E049 Nontoxic goiter, unspecified: Secondary | ICD-10-CM | POA: Diagnosis not present

## 2017-05-18 DIAGNOSIS — E0590 Thyrotoxicosis, unspecified without thyrotoxic crisis or storm: Secondary | ICD-10-CM | POA: Diagnosis not present

## 2017-05-18 DIAGNOSIS — I482 Chronic atrial fibrillation: Secondary | ICD-10-CM | POA: Diagnosis not present

## 2017-05-18 DIAGNOSIS — G4733 Obstructive sleep apnea (adult) (pediatric): Secondary | ICD-10-CM | POA: Diagnosis not present

## 2017-05-23 ENCOUNTER — Encounter (HOSPITAL_COMMUNITY): Payer: Self-pay | Admitting: Surgery

## 2017-05-23 DIAGNOSIS — E0520 Thyrotoxicosis with toxic multinodular goiter without thyrotoxic crisis or storm: Secondary | ICD-10-CM | POA: Diagnosis present

## 2017-05-23 NOTE — H&P (Signed)
General Surgery [facility]  Cody Gomez 05/06/2017 10:33 AM Location: [facility] Patient #: [id number] DOB: [birth year] Widowed / Language: Cleophus Molt / Race: White Male   History of Present Illness Cody Gomez; 05/06/2017 11:14 AM) The patient is a 83 year old male who presents with a complaint of Enlarged thyroid.  CC: thyroid goiter, hyperthyroidism  Patient is referred by Cody Gomez for evaluation of thyroid goiter with compressive symptoms and hyperthyroidism. Patient states that he has had a thyroid goiter for a number of years. It is gradually increased in size. He has developed compressive symptoms including dysphagia and dyspnea. Patient also has hyperthyroidism and is currently taking methimazole under the direction of his endocrinologist in [facility]. Patient has had no prior surgery on the head or neck. He does have a significant cardiac history and has a pacemaker and defibrillator in place. He is anticoagulated. Patient had a CT scan of the chest performed on Apr 25, 2017. This showed a very enlarged heterogeneous and nodular thyroid with tracheal narrowing. Patient also had an element of venous congestion possibly due to stricture versus compression by the thyroid. There is a family history of thyroid disease and the patient's mother who underwent surgery for thyroid goiter. There is no family history of other endocrine neoplasms.   Past Surgical History Cody Lorenzo, LPN; 8/41/6606 30:16 AM) Prostate Surgery - Removal   Diagnostic Studies History Cody Lorenzo, LPN; 0/09/9322 55:73 AM) Colonoscopy  never  Allergies Cody Lorenzo, LPN; 02/01/2541 70:62 AM) DOBUTAMINE  Penicillins  Allergies Reconciled   Medication History Cody Lorenzo, LPN; 3/76/2831 51:76 AM) Symbicort (160-4.5MCG/ACT Aerosol, Inhalation) Active. Mexiletine HCl (150MG  Capsule, Oral) Active. Carvedilol (12.5MG  Tablet, Oral)  Active. Warfarin Sodium (5MG  Tablet, Oral) Active. MethIMAzole (5MG  Tablet, Oral) Active. Dofetilide (500MCG Capsule, Oral) Active. Furosemide (40MG  Tablet, Oral) Active. Potassium Chloride ER (10MEQ Capsule ER, Oral) Active. Albuterol Sulfate ((2.5 MG/3ML)0.083% Nebulized Soln, Inhalation) Active. Medications Reconciled  Social History Cody Lorenzo, LPN; 1/60/7371 06:26 AM) Caffeine use  Coffee, Tea. No alcohol use  No drug use  Tobacco use  Former smoker.  Family History Cody Lorenzo, LPN; 9/48/5462 70:35 AM) First Degree Relatives  No pertinent family history   Other Problems Cody Lorenzo, LPN; 0/08/3817 29:93 AM) Asthma  Sleep Apnea     Review of Systems Cody Billings Dockery LPN; 06/27/9677 93:81 AM) General Not Present- Appetite Loss, Chills, Fatigue, Fever, Night Sweats, Weight Gain and Weight Loss. Skin Not Present- Change in Wart/Mole, Dryness, Hives, Jaundice, New Lesions, Non-Healing Wounds, Rash and Ulcer. HEENT Present- Nose Bleed and Seasonal Allergies. Not Present- Earache, Hearing Loss, Hoarseness, Oral Ulcers, Ringing in the Ears, Sinus Pain, Sore Throat, Visual Disturbances, Wears glasses/contact lenses and Yellow Eyes. Respiratory Present- Wheezing. Not Present- Bloody sputum, Chronic Cough, Difficulty Breathing and Snoring. Breast Not Present- Breast Mass, Breast Pain, Nipple Discharge and Skin Changes. Cardiovascular Not Present- Chest Pain, Difficulty Breathing Lying Down, Leg Cramps, Palpitations, Rapid Heart Rate, Shortness of Breath and Swelling of Extremities. Gastrointestinal Not Present- Abdominal Pain, Bloating, Bloody Stool, Change in Bowel Habits, Chronic diarrhea, Constipation, Difficulty Swallowing, Excessive gas, Gets full quickly at meals, Hemorrhoids, Indigestion, Nausea, Rectal Pain and Vomiting. Male Genitourinary Present- Urine Leakage. Not Present- Blood in Urine, Change in Urinary Stream, Frequency, Impotence, Nocturia, Painful  Urination and Urgency.  Vitals Cody Billings Dockery LPN; 0/17/5102 58:52 AM) 05/06/2017 10:34 AM Weight: 206.2 lb Height: 67in Body Surface Area: 2.05 m Body Mass Index: 32.3 kg/m  BP: 138/86 (Sitting, Left  Arm, Standard)       Physical Exam Cody Gomez; 05/06/2017 11:16 AM) The physical exam findings are as follows: Note:CONSTITUTIONAL See vital signs recorded above  GENERAL APPEARANCE Development: normal Nutritional status: normal Gross deformities: none  SKIN Rash, lesions, ulcers: none Induration, erythema: none Nodules: none palpable  EYES Conjunctiva and lids: normal Pupils: equal and reactive Iris: normal bilaterally  EARS, NOSE, MOUTH, THROAT External ears: no lesion or deformity External nose: no lesion or deformity Hearing: grossly normal Lips: no lesion or deformity Dentition: normal for age Oral mucosa: moist  NECK Symmetric: no Trachea: deviated to left Thyroid: Large multinodular thyroid goiter, more easily palpated on the right and the left, soft, nontender; voice with slight raspy quality, not true hoarseness; no stridor although respirations are audible at the conversational level  CHEST Respiratory effort: normal Retraction or accessory muscle use: no Breath sounds: normal bilaterally Rales, rhonchi, wheeze: none  CARDIOVASCULAR Auscultation: regular rhythm, normal rate Murmurs: none Pulses: carotid and radial pulse 2+ palpable Lower extremity edema: none Lower extremity varicosities: none  MUSCULOSKELETAL Station and gait: normal Digits and nails: no clubbing or cyanosis Muscle strength: grossly normal all extremities Range of motion: grossly normal all extremities Deformity: none  LYMPHATIC Cervical: none palpable Supraclavicular: none palpable Axillary: none palpable  PSYCHIATRIC Oriented to person, place, and time: yes Mood and affect: normal for situation Judgment and insight: appropriate for  situation    Assessment & Plan Cody Gomez; 05/06/2017 11:18 AM) GOITER, NODULAR (E04.9) HYPERTHYROIDISM (E05.90) Current Plans Pt Education - Cody Gomez - The Thyroid Book: discussed with patient and provided information. Patient presents on referral from his cardiologist for evaluation of large thyroid goiter with compressive symptoms and hyperthyroidism. He is accompanied by his son. I have Gomez them written literature on thyroid surgery to review at home.  We reviewed his CT scan together today in the office. We looked at the images and a significant degree of tracheal narrowing which is present. We discussed his hyperthyroidism.  Based on these findings, I have recommended proceeding with total thyroidectomy. We discussed the risk and benefits of the procedure including the potential for recurrent laryngeal nerve injury and injury to parathyroid glands. We discussed the location of the surgical incision. We discussed potential complications including bleeding and infection. We discussed the hospital stay to be anticipated. We discussed the need for lifelong thyroid hormone replacement therapy. Patient and his son understand and agree to proceed.  Patient will require cardiac clearance from the office of Cody Gomez. We will plan his surgery at [facility]. I will attempt to contact his primary care physician and his endocrinologist in [facility].  The risks and benefits of the procedure have been discussed at length with the patient. The patient understands the proposed procedure, potential alternative treatments, and the course of recovery to be expected. All of the patient's questions have been answered at this time. The patient wishes to proceed with surgery.  Cody Regal, Gomez, [facility] Office: [phone]

## 2017-05-24 ENCOUNTER — Encounter (HOSPITAL_COMMUNITY): Payer: Self-pay | Admitting: *Deleted

## 2017-05-24 NOTE — Progress Notes (Signed)
Dr. Gala Lewandowsky office called and ask to fax copy of cardiac clearance from Dr. [name].   Peri-Operative Programming Orders faxed to device Clinic.

## 2017-05-24 NOTE — Progress Notes (Signed)
Patient denies chest pain and shortness of breath. Reports that coumadin has been stopped for 2- 3 days managed by Dr. Cathi Roan at [facility]. Cardiologist is Dr. [name] recent note in chart.

## 2017-05-24 NOTE — Progress Notes (Signed)
Medtronic Rep Tomi Bamberger  Notified of pt. Name ,date, and time of surgery.

## 2017-05-25 NOTE — Progress Notes (Signed)
Anesthesia Chart Review: SAME DAY WORK-UP.  Patient is a 83 year old male scheduled for total thyroidectomy on 05/26/17 by Dr. Armandina Gemma.  History includes former smoker, nonischemic dilated cardiomyopathy, AICD (Medtronic) 10/29/10 (generator replacement 06/22/13), V-tach ablation 03/26/14, afib, GERD, HLD, asthma, prostate cancer s/p prostatectomy, OSA (on CPAP), hyperthyroidism (on methimazole), chronic back pain, skin cancer.   -  PCP is Dr. Cyndy Freeze with [facility] in [place]. - Endocrinologist is Dr. Iran [facility]; Care Everywhere). Phone message from 03/14/17 indicated patient had been off Methimazole, so it was restarted with recommendation for one month follow-up. (Last thyroid studies were done at [facility] on 03/10/17 --scanned under Media tab--and showed a normal T3 162, high normal free T4 1.50, low TSH 0.069.) - Pulmonologist is Dr. [name], last visit noted was from 10/29/14. - EP cardiologist is Dr. Virl Axe. He saw patient on 03/28/17. Cardiomyopathy was felt to be worsening (EF 20-25% 01/2017, down from 50-55% 06/2013; Cath 02/2017 showed non-obstructive CAD). Functional status remained poor. He was being considered for CRT upgrade; however a plethora of veins was noted on his chest, so venography was recommended. Chest CT done 04/25/17. On 04/28/17 he wrote, "Spoke with Dr.Yamagata who recommended I discussed this with Dr. Harlow Asa which I have done. There is clear obstruction to subclavian flow. The right sided inflows are harder to determine because the contrast was given in the left arm. However, there is evidence of right facial chest wall collateralization and in conjunction with the venogram taken in the EP lab supports high-grade venous obstruction although probably not thrombosis. It turns out there is also a very large goiter which is compressing the venous structures as well as the trachea." His office provided anticoagulation instructions for surgery: hold  warfarin for 5 days without Lovenox bridge.   Meds include albuterol, Coreg, Tikosyn, mexiletine, Lasix, Tapazole, Singulair, nitroglycerin, KCl, Symbicort, warfarin (on hold).  EKG 03/28/17: AV sequential or dual-chamber electronic pacemaker.  Cardiac cath 02/28/17: 1. Diffusely calcified coronary arteries with no evidence of obstructive CAD (40% mid LAD, minimal luminal irregularities LCx, 30% ostial-proximal CX, 30% proximal RCA) 2. Normal right heart hemodynamics with normal intracardiac pressures and preserved cardiac output Recommend: Resume warfarin tonight. FU with Dr [name] as planned.  Echo 02/03/17: Study Conclusions - Left ventricle: The cavity size was mildly dilated. Wall   thickness was increased in a pattern of mild LVH. Systolic   function was severely reduced. The estimated ejection fraction   was in the range of 20% to 25%. Diffuse hypokinesis. Doppler   parameters are consistent with abnormal left ventricular   relaxation (grade 1 diastolic dysfunction). - Aortic valve: There was mild regurgitation. - Aortic root: The aortic root was mildly dilated. - Mitral valve: There was mild regurgitation. - Left atrium: The atrium was mildly dilated. - Pulmonary arteries: Systolic pressure was mildly increased. Impressions: - Severe global reduction in LV systolic function; mild LVH; mild   LVE; grade 1 diastolic dysfunction; sclerotic aortic valve with   mild AI; mildly dilated aortic root; mild MR; mild LAE; mild TR;   mildly elevated pulmonary pressure.  He is pacer dependent. EP recommended that ICD be reprogrammed (tachy therapies disabled, asynchronous pacing during procedure) prior to surgery and return to normal programming afterwards.  Chest CT 04/25/17:  - Cardiovascular: Left subclavian/left brachiocephalic vein stenosis with associated collateral venous flow in the mediastinum and left chest wall. Indwelling pacemaker/ICD leads. SVC is patent. Atherosclerotic  calcification of the arterial  vasculature, including three-vessel involvement of the coronary arteries. Heart is at the upper limits of normal in size. No pericardial effusion. - Mediastinum/Nodes: Thyroid is very enlarged, heterogeneous and nodular, with slight tracheal narrowing. Mediastinal and hilar lymph nodes are not enlarged by CT size criteria. No axillary adenopathy. Esophagus is grossly unremarkable. - Lungs/Pleura: Lungs are clear. No pleural fluid. Upper tracheal narrowing secondary to a markedly enlarged thyroid. There may be slight nodularity along the ventral wall the trachea and mainstem bronchi. IMPRESSION: 1. SVC is patent. 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 2. Aortic atherosclerosis (ICD10-170.0). Coronary artery calcification. 3. Markedly enlarged, nodular and heterogeneous thyroid, suggestive of a goiter, with associated airway narrowing. Sonographic evaluation performed 02/24/2016. 4. Cholelithiasis.  He is for labs on arrival. (CBC, BMET, PT/INR).   Above discussed with anesthesiologist Dr. Kalman Shan. Patient's last CXR is from March with chest CT in May. Dr. Kalman Shan recommends repeat 2V CXR on arrival with special attention to tracheal deviation/compression. Anesthesiologist to review CXR, labs, and evaluate patient on the day of surgery.  George [facility]/Anesthesiology Phone [phone] 05/25/2017 1:10 PM

## 2017-05-26 ENCOUNTER — Ambulatory Visit (HOSPITAL_COMMUNITY): Payer: Medicare Other | Admitting: Vascular Surgery

## 2017-05-26 ENCOUNTER — Ambulatory Visit (HOSPITAL_COMMUNITY): Payer: Medicare Other

## 2017-05-26 ENCOUNTER — Observation Stay (HOSPITAL_COMMUNITY)
Admission: RE | Admit: 2017-05-26 | Discharge: 2017-05-27 | Disposition: A | Discharge status: Home / Self Care | Payer: Medicare Other | Source: Ambulatory Visit | Attending: Surgery | Admitting: Surgery

## 2017-05-26 ENCOUNTER — Encounter (HOSPITAL_COMMUNITY): Payer: Self-pay | Admitting: *Deleted

## 2017-05-26 ENCOUNTER — Encounter (HOSPITAL_COMMUNITY)
Admission: RE | Disposition: A | Discharge status: Home / Self Care | Payer: Self-pay | Source: Ambulatory Visit | Attending: Surgery

## 2017-05-26 DIAGNOSIS — I2510 Atherosclerotic heart disease of native coronary artery without angina pectoris: Secondary | ICD-10-CM | POA: Diagnosis not present

## 2017-05-26 DIAGNOSIS — Z01811 Encounter for preprocedural respiratory examination: Secondary | ICD-10-CM

## 2017-05-26 DIAGNOSIS — I509 Heart failure, unspecified: Secondary | ICD-10-CM | POA: Diagnosis not present

## 2017-05-26 DIAGNOSIS — K219 Gastro-esophageal reflux disease without esophagitis: Secondary | ICD-10-CM | POA: Insufficient documentation

## 2017-05-26 DIAGNOSIS — Z79899 Other long term (current) drug therapy: Secondary | ICD-10-CM | POA: Diagnosis not present

## 2017-05-26 DIAGNOSIS — I10 Essential (primary) hypertension: Secondary | ICD-10-CM | POA: Diagnosis not present

## 2017-05-26 DIAGNOSIS — Z87891 Personal history of nicotine dependence: Secondary | ICD-10-CM | POA: Insufficient documentation

## 2017-05-26 DIAGNOSIS — J45909 Unspecified asthma, uncomplicated: Secondary | ICD-10-CM | POA: Insufficient documentation

## 2017-05-26 DIAGNOSIS — E0520 Thyrotoxicosis with toxic multinodular goiter without thyrotoxic crisis or storm: Secondary | ICD-10-CM | POA: Diagnosis not present

## 2017-05-26 DIAGNOSIS — Z7901 Long term (current) use of anticoagulants: Secondary | ICD-10-CM | POA: Insufficient documentation

## 2017-05-26 DIAGNOSIS — E0590 Thyrotoxicosis, unspecified without thyrotoxic crisis or storm: Secondary | ICD-10-CM | POA: Diagnosis not present

## 2017-05-26 DIAGNOSIS — Z950 Presence of cardiac pacemaker: Secondary | ICD-10-CM | POA: Insufficient documentation

## 2017-05-26 DIAGNOSIS — I4891 Unspecified atrial fibrillation: Secondary | ICD-10-CM | POA: Diagnosis not present

## 2017-05-26 DIAGNOSIS — E049 Nontoxic goiter, unspecified: Secondary | ICD-10-CM | POA: Diagnosis not present

## 2017-05-26 HISTORY — DX: Personal history of urinary calculi: Z87.442

## 2017-05-26 HISTORY — PX: THYROIDECTOMY: SHX17

## 2017-05-26 LAB — CBC
HEMATOCRIT: 44.2 % (ref 39.0–52.0)
HEMOGLOBIN: 14.1 g/dL (ref 13.0–17.0)
MCH: 29.3 pg (ref 26.0–34.0)
MCHC: 31.9 g/dL (ref 30.0–36.0)
MCV: 91.9 fL (ref 78.0–100.0)
Platelets: 226 10*3/uL (ref 150–400)
RBC: 4.81 MIL/uL (ref 4.22–5.81)
RDW: 15.6 % — AB (ref 11.5–15.5)
WBC: 5.6 10*3/uL (ref 4.0–10.5)

## 2017-05-26 LAB — BASIC METABOLIC PANEL
ANION GAP: 7 (ref 5–15)
BUN: 18 mg/dL (ref 6–20)
CALCIUM: 9 mg/dL (ref 8.9–10.3)
CO2: 24 mmol/L (ref 22–32)
Chloride: 106 mmol/L (ref 101–111)
Creatinine, Ser: 0.86 mg/dL (ref 0.61–1.24)
GFR calc Af Amer: >60 mL/min (ref >60)
Glucose, Bld: 100 mg/dL — ABNORMAL HIGH (ref 65–99)
Potassium: 3.7 mmol/L (ref 3.5–5.1)
Sodium: 137 mmol/L (ref 135–145)

## 2017-05-26 LAB — PROTIME-INR
INR: 1.06
PROTHROMBIN TIME: 13.8 s (ref 11.4–15.2)

## 2017-05-26 LAB — MAGNESIUM: Magnesium: 1.8 mg/dL (ref 1.7–2.4)

## 2017-05-26 LAB — APTT: aPTT: 26 seconds (ref 24–36)

## 2017-05-26 SURGERY — THYROIDECTOMY
Anesthesia: General | Site: Throat

## 2017-05-26 MED ORDER — CIPROFLOXACIN IN D5W 400 MG/200ML IV SOLN
400.0000 mg | INTRAVENOUS | Status: AC
  Administered 2017-05-26: 400 mg via INTRAVENOUS
  Filled 2017-05-26: qty 200

## 2017-05-26 MED ORDER — NITROGLYCERIN 0.4 MG SL SUBL: 0.4000 mg | SUBLINGUAL_TABLET | SUBLINGUAL | Status: DC | PRN

## 2017-05-26 MED ORDER — HYDROMORPHONE HCL 1 MG/ML IJ SOLN
1.0000 mg | INTRAMUSCULAR | Status: DC | PRN
  Administered 2017-05-26: 1 mg via INTRAVENOUS
  Filled 2017-05-26: qty 1

## 2017-05-26 MED ORDER — ONDANSETRON HCL 4 MG/2ML IJ SOLN: 4.0000 mg | Freq: Four times a day (QID) | INTRAMUSCULAR | Status: DC | PRN

## 2017-05-26 MED ORDER — ONDANSETRON HCL 4 MG/2ML IJ SOLN
INTRAMUSCULAR | Status: DC | PRN
  Administered 2017-05-26: 4 mg via INTRAVENOUS

## 2017-05-26 MED ORDER — POTASSIUM CHLORIDE CRYS ER 20 MEQ PO TBCR: 20.0000 meq | EXTENDED_RELEASE_TABLET | Freq: Every day | ORAL | Status: DC

## 2017-05-26 MED ORDER — ONDANSETRON HCL 4 MG/2ML IJ SOLN
INTRAMUSCULAR | Status: AC
  Filled 2017-05-26: qty 2

## 2017-05-26 MED ORDER — MOMETASONE FURO-FORMOTEROL FUM 200-5 MCG/ACT IN AERO
2.0000 | INHALATION_SPRAY | Freq: Two times a day (BID) | RESPIRATORY_TRACT | Status: DC
  Administered 2017-05-26 – 2017-05-27 (×2): 2 via RESPIRATORY_TRACT
  Filled 2017-05-26: qty 8.8

## 2017-05-26 MED ORDER — HEMOSTATIC AGENTS (NO CHARGE) OPTIME
TOPICAL | Status: DC | PRN
  Administered 2017-05-26: 1 via TOPICAL

## 2017-05-26 MED ORDER — CHLORHEXIDINE GLUCONATE CLOTH 2 % EX PADS: 6.0000 | MEDICATED_PAD | Freq: Once | CUTANEOUS | Status: DC

## 2017-05-26 MED ORDER — MONTELUKAST SODIUM 10 MG PO TABS
10.0000 mg | ORAL_TABLET | Freq: Every day | ORAL | Status: DC
  Administered 2017-05-26: 10 mg via ORAL
  Filled 2017-05-26: qty 1

## 2017-05-26 MED ORDER — FENTANYL CITRATE (PF) 250 MCG/5ML IJ SOLN
INTRAMUSCULAR | Status: AC
  Filled 2017-05-26: qty 5

## 2017-05-26 MED ORDER — MEPERIDINE HCL 25 MG/ML IJ SOLN: 6.2500 mg | INTRAMUSCULAR | Status: DC | PRN

## 2017-05-26 MED ORDER — KCL IN DEXTROSE-NACL 20-5-0.45 MEQ/L-%-% IV SOLN
INTRAVENOUS | Status: DC
Start: 2017-05-26 — End: 2017-05-27
  Administered 2017-05-26: 16:00:00 via INTRAVENOUS
  Filled 2017-05-26: qty 1000

## 2017-05-26 MED ORDER — ETOMIDATE 2 MG/ML IV SOLN
INTRAVENOUS | Status: AC
  Filled 2017-05-26: qty 10

## 2017-05-26 MED ORDER — HYDROMORPHONE HCL 1 MG/ML IJ SOLN
INTRAMUSCULAR | Status: AC
  Administered 2017-05-26: 0.5 mg via INTRAVENOUS
  Filled 2017-05-26: qty 1

## 2017-05-26 MED ORDER — LIDOCAINE 2% (20 MG/ML) 5 ML SYRINGE
INTRAMUSCULAR | Status: AC
  Filled 2017-05-26: qty 5

## 2017-05-26 MED ORDER — POTASSIUM CHLORIDE CRYS ER 20 MEQ PO TBCR
20.0000 meq | EXTENDED_RELEASE_TABLET | Freq: Every day | ORAL | Status: DC
  Administered 2017-05-27: 20 meq via ORAL
  Filled 2017-05-26: qty 1

## 2017-05-26 MED ORDER — HYDROCODONE-ACETAMINOPHEN 5-325 MG PO TABS
1.0000 | ORAL_TABLET | ORAL | Status: DC | PRN
  Administered 2017-05-26 – 2017-05-27 (×3): 2 via ORAL
  Filled 2017-05-26 (×5): qty 2

## 2017-05-26 MED ORDER — LIDOCAINE HCL (CARDIAC) 20 MG/ML IV SOLN
INTRAVENOUS | Status: DC | PRN
  Administered 2017-05-26: 60 mg via INTRAVENOUS

## 2017-05-26 MED ORDER — ONDANSETRON 4 MG PO TBDP: 4.0000 mg | ORAL_TABLET | Freq: Four times a day (QID) | ORAL | Status: DC | PRN

## 2017-05-26 MED ORDER — ACETAMINOPHEN 650 MG RE SUPP: 650.0000 mg | Freq: Four times a day (QID) | RECTAL | Status: DC | PRN

## 2017-05-26 MED ORDER — SUCCINYLCHOLINE 20MG/ML (10ML) SYRINGE FOR MEDFUSION PUMP - OPTIME
INTRAMUSCULAR | Status: DC | PRN
  Administered 2017-05-26: 140 mg via INTRAVENOUS

## 2017-05-26 MED ORDER — FUROSEMIDE 40 MG PO TABS
40.0000 mg | ORAL_TABLET | Freq: Every day | ORAL | Status: DC
  Administered 2017-05-27: 40 mg via ORAL
  Filled 2017-05-26: qty 1

## 2017-05-26 MED ORDER — ETOMIDATE 2 MG/ML IV SOLN
INTRAVENOUS | Status: DC | PRN
  Administered 2017-05-26: 20 mg via INTRAVENOUS

## 2017-05-26 MED ORDER — PHENYLEPHRINE HCL 10 MG/ML IJ SOLN
INTRAMUSCULAR | Status: DC | PRN
  Administered 2017-05-26: 20 ug/min via INTRAVENOUS

## 2017-05-26 MED ORDER — ALBUTEROL SULFATE (2.5 MG/3ML) 0.083% IN NEBU: 2.5000 mg | INHALATION_SOLUTION | Freq: Four times a day (QID) | RESPIRATORY_TRACT | Status: DC | PRN

## 2017-05-26 MED ORDER — FENTANYL CITRATE (PF) 100 MCG/2ML IJ SOLN
INTRAMUSCULAR | Status: DC | PRN
  Administered 2017-05-26: 100 ug via INTRAVENOUS
  Administered 2017-05-26 (×3): 50 ug via INTRAVENOUS

## 2017-05-26 MED ORDER — HYDROMORPHONE HCL 1 MG/ML IJ SOLN
0.2500 mg | INTRAMUSCULAR | Status: DC | PRN
  Administered 2017-05-26 (×4): 0.5 mg via INTRAVENOUS

## 2017-05-26 MED ORDER — ONDANSETRON HCL 4 MG/2ML IJ SOLN: 4.0000 mg | Freq: Once | INTRAMUSCULAR | Status: DC | PRN

## 2017-05-26 MED ORDER — CALCIUM CARBONATE 1250 (500 CA) MG PO TABS
2.0000 | ORAL_TABLET | Freq: Three times a day (TID) | ORAL | Status: DC
  Administered 2017-05-26 – 2017-05-27 (×3): 1000 mg via ORAL
  Filled 2017-05-26 (×3): qty 1

## 2017-05-26 MED ORDER — 0.9 % SODIUM CHLORIDE (POUR BTL) OPTIME
TOPICAL | Status: DC | PRN
  Administered 2017-05-26: 1000 mL

## 2017-05-26 MED ORDER — DOFETILIDE 500 MCG PO CAPS
500.0000 ug | ORAL_CAPSULE | Freq: Two times a day (BID) | ORAL | Status: DC
  Administered 2017-05-27: 500 ug via ORAL
  Filled 2017-05-26: qty 1

## 2017-05-26 MED ORDER — ROCURONIUM BROMIDE 100 MG/10ML IV SOLN
INTRAVENOUS | Status: DC | PRN
  Administered 2017-05-26: 50 mg via INTRAVENOUS

## 2017-05-26 MED ORDER — SUGAMMADEX SODIUM 200 MG/2ML IV SOLN
INTRAVENOUS | Status: DC | PRN
Start: 2017-05-26 — End: 2017-05-26
  Administered 2017-05-26: 200 mg via INTRAVENOUS

## 2017-05-26 MED ORDER — ACETAMINOPHEN 325 MG PO TABS
650.0000 mg | ORAL_TABLET | Freq: Four times a day (QID) | ORAL | Status: DC | PRN
  Administered 2017-05-27: 650 mg via ORAL
  Filled 2017-05-26: qty 2

## 2017-05-26 MED ORDER — LACTATED RINGERS IV SOLN
INTRAVENOUS | Status: DC
  Administered 2017-05-26 (×2): via INTRAVENOUS

## 2017-05-26 MED ORDER — CARVEDILOL 12.5 MG PO TABS
12.5000 mg | ORAL_TABLET | Freq: Two times a day (BID) | ORAL | Status: DC
  Administered 2017-05-26 – 2017-05-27 (×2): 12.5 mg via ORAL
  Filled 2017-05-26 (×2): qty 1

## 2017-05-26 SURGICAL SUPPLY — 53 items
ATTRACTOMAT 16X20 MAGNETIC DRP (DRAPES) ×3 IMPLANT
BLADE SURG 10 STRL SS (BLADE) ×3 IMPLANT
BLADE SURG 15 STRL LF DISP TIS (BLADE) ×1 IMPLANT
BLADE SURG 15 STRL SS (BLADE) ×3
CANISTER SUCT 3000ML PPV (MISCELLANEOUS) ×3 IMPLANT
CHLORAPREP W/TINT 26ML (MISCELLANEOUS) ×2 IMPLANT
CLIP TI MEDIUM 24 (CLIP) ×7 IMPLANT
CLIP TI WIDE RED SMALL 24 (CLIP) ×5 IMPLANT
CLOSURE WOUND 1/2 X4 (GAUZE/BANDAGES/DRESSINGS) ×1
COVER SURGICAL LIGHT HANDLE (MISCELLANEOUS) ×3 IMPLANT
CRADLE DONUT ADULT HEAD (MISCELLANEOUS) ×3 IMPLANT
DRAPE INCISE IOBAN 66X45 STRL (DRAPES) ×2 IMPLANT
DRAPE LAPAROTOMY 100X72 PEDS (DRAPES) ×3 IMPLANT
DRAPE UTILITY XL STRL (DRAPES) ×3 IMPLANT
ELECT CAUTERY BLADE 6.4 (BLADE) ×3 IMPLANT
ELECT REM PT RETURN 9FT ADLT (ELECTROSURGICAL) ×3
ELECTRODE REM PT RTRN 9FT ADLT (ELECTROSURGICAL) ×1 IMPLANT
GAUZE SPONGE 4X4 12PLY STRL (GAUZE/BANDAGES/DRESSINGS) ×3 IMPLANT
GAUZE SPONGE 4X4 16PLY XRAY LF (GAUZE/BANDAGES/DRESSINGS) ×5 IMPLANT
GLOVE BIOGEL PI IND STRL 6 (GLOVE) IMPLANT
GLOVE BIOGEL PI IND STRL 6.5 (GLOVE) IMPLANT
GLOVE BIOGEL PI INDICATOR 6 (GLOVE) ×2
GLOVE BIOGEL PI INDICATOR 6.5 (GLOVE) ×2
GLOVE ECLIPSE 8.0 STRL XLNG CF (GLOVE) ×2 IMPLANT
GLOVE SURG ORTHO 8.0 STRL STRW (GLOVE) ×3 IMPLANT
GLOVE SURG SS PI 6.0 STRL IVOR (GLOVE) ×2 IMPLANT
GOWN STRL REUS W/ TWL LRG LVL3 (GOWN DISPOSABLE) ×1 IMPLANT
GOWN STRL REUS W/ TWL XL LVL3 (GOWN DISPOSABLE) ×1 IMPLANT
GOWN STRL REUS W/TWL LRG LVL3 (GOWN DISPOSABLE) ×3
GOWN STRL REUS W/TWL XL LVL3 (GOWN DISPOSABLE) ×3
HEMOSTAT SURGICEL 2X4 FIBR (HEMOSTASIS) ×3 IMPLANT
ILLUMINATOR WAVEGUIDE N/F (MISCELLANEOUS) ×2 IMPLANT
KIT BASIN OR (CUSTOM PROCEDURE TRAY) ×3 IMPLANT
KIT ROOM TURNOVER OR (KITS) ×3 IMPLANT
NS IRRIG 1000ML POUR BTL (IV SOLUTION) ×3 IMPLANT
PACK SURGICAL SETUP 50X90 (CUSTOM PROCEDURE TRAY) ×3 IMPLANT
PAD ARMBOARD 7.5X6 YLW CONV (MISCELLANEOUS) ×3 IMPLANT
PENCIL BUTTON HOLSTER BLD 10FT (ELECTRODE) ×3 IMPLANT
SHEARS HARMONIC 9CM CVD (BLADE) ×3 IMPLANT
SPECIMEN JAR MEDIUM (MISCELLANEOUS) ×3 IMPLANT
SPONGE INTESTINAL PEANUT (DISPOSABLE) ×3 IMPLANT
STRIP CLOSURE SKIN 1/2X4 (GAUZE/BANDAGES/DRESSINGS) ×2 IMPLANT
SUT MNCRL AB 4-0 PS2 18 (SUTURE) ×3 IMPLANT
SUT SILK 2 0 (SUTURE) ×3
SUT SILK 2-0 18XBRD TIE 12 (SUTURE) ×1 IMPLANT
SUT SILK 3 0 (SUTURE) ×3
SUT SILK 3-0 18XBRD TIE 12 (SUTURE) IMPLANT
SUT VIC AB 3-0 SH 18 (SUTURE) ×6 IMPLANT
SYR BULB 3OZ (MISCELLANEOUS) ×3 IMPLANT
TAPE CLOTH SURG 4X10 WHT LF (GAUZE/BANDAGES/DRESSINGS) ×2 IMPLANT
TOWEL OR 17X24 6PK STRL BLUE (TOWEL DISPOSABLE) ×3 IMPLANT
TUBE CONNECTING 12'X1/4 (SUCTIONS) ×1
TUBE CONNECTING 12X1/4 (SUCTIONS) ×2 IMPLANT

## 2017-05-26 NOTE — Anesthesia Procedure Notes (Signed)
Procedure Name: Intubation Date/Time: 05/26/2017 9:59 AM Performed by: Lillia Abed Pre-anesthesia Checklist: Patient identified, Emergency Drugs available, Suction available, Patient being monitored and Timeout performed Patient Re-evaluated:Patient Re-evaluated prior to inductionOxygen Delivery Method: Circle system utilized Preoxygenation: Pre-oxygenation with 100% oxygen Intubation Type: IV induction Ventilation: Oral airway inserted - appropriate to patient size and Mask ventilation without difficulty Laryngoscope Size: Mac and 4 Grade View: Grade I Tube type: Oral Tube size: 7.5 mm Number of attempts: 1 Airway Equipment and Method: Stylet and Oral airway Placement Confirmation: ETT inserted through vocal cords under direct vision,  positive ETCO2 and breath sounds checked- equal and bilateral Secured at: 19 cm Tube secured with: Tape Dental Injury: Teeth and Oropharynx as per pre-operative assessment  Comments: Inserted by Dicie Beam SRNA

## 2017-05-26 NOTE — Interval H&P Note (Signed)
History and Physical Interval Note:  05/26/2017 9:01 AM  Cody Gomez  has presented today for surgery, with the diagnosis of Thyrotoxicosis, thyroid goiter.  The various methods of treatment have been discussed with the patient and family. After consideration of risks, benefits and other options for treatment, the patient has consented to    Procedure(s): TOTAL THYROIDECTOMY (N/A) as a surgical intervention .    The patient's history has been reviewed, patient examined, no change in status, stable for surgery.  I have reviewed the patient's chart and labs.  Questions were answered to the patient's satisfaction.    Earnstine Regal, MD, [facility] Office: [place]

## 2017-05-26 NOTE — Anesthesia Postprocedure Evaluation (Signed)
Anesthesia Post Note  Patient: Talbert Cage  Procedure(s) Performed: Procedure(s) (LRB): TOTAL THYROIDECTOMY (N/A)     Patient location during evaluation: PACU Anesthesia Type: General Level of consciousness: awake and alert Pain management: pain level controlled Vital Signs Assessment: post-procedure vital signs reviewed and stable Respiratory status: spontaneous breathing, nonlabored ventilation, respiratory function stable and patient connected to nasal cannula oxygen Cardiovascular status: blood pressure returned to baseline and stable Postop Assessment: no signs of nausea or vomiting Anesthetic complications: no    Last Vitals:  Vitals:   05/26/17 1430 05/26/17 1456  BP: 129/63 128/63  Pulse: 70 70  Resp: 13 17  Temp: 36.7 C 36.4 C    Last Pain:  Vitals:   05/26/17 1456  TempSrc: Oral  PainSc:                  Rube Sanchez DAVID

## 2017-05-26 NOTE — Anesthesia Preprocedure Evaluation (Signed)
Anesthesia Evaluation  Patient identified by MRN, date of birth, ID band Patient awake    Reviewed: Allergy & Precautions, NPO status , Patient's Chart, lab work & pertinent test results  Airway Mallampati: I  TM Distance: >3 FB Neck ROM: Full    Dental   Pulmonary sleep apnea , COPD, former smoker,    Pulmonary exam normal        Cardiovascular hypertension, Pt. on medications Normal cardiovascular exam+ Cardiac Defibrillator      Neuro/Psych    GI/Hepatic GERD  Medicated and Controlled,  Endo/Other    Renal/GU      Musculoskeletal   Abdominal   Peds  Hematology   Anesthesia Other Findings   Reproductive/Obstetrics                             Anesthesia Physical Anesthesia Plan  ASA: III  Anesthesia Plan: General   Post-op Pain Management:    Induction: Intravenous  PONV Risk Score and Plan: 2 and Ondansetron and Treatment may vary due to age or medical condition  Airway Management Planned: Oral ETT  Additional Equipment:   Intra-op Plan:   Post-operative Plan: Extubation in OR  Informed Consent: I have reviewed the patients History and Physical, chart, labs and discussed the procedure including the risks, benefits and alternatives for the proposed anesthesia with the patient or authorized representative who has indicated his/her understanding and acceptance.     Plan Discussed with: CRNA and Surgeon  Anesthesia Plan Comments:         Anesthesia Quick Evaluation

## 2017-05-26 NOTE — Progress Notes (Signed)
Pt did not meet med QTC parameters. RN paged MD and received verbal order to hold tikosyn. Will continue to follow.

## 2017-05-26 NOTE — Transfer of Care (Signed)
Immediate Anesthesia Transfer of Care Note  Patient: Cody Gomez  Procedure(s) Performed: Procedure(s): TOTAL THYROIDECTOMY (N/A)  Patient Location: PACU  Anesthesia Type:General  Level of Consciousness: awake, alert  and oriented  Airway & Oxygen Therapy: Patient Spontanous Breathing and Patient connected to nasal cannula oxygen  Post-op Assessment: Report given to RN, Post -op Vital signs reviewed and stable and Patient moving all extremities X 4  Post vital signs: Reviewed and stable  Last Vitals:  Vitals:   05/26/17 0706  BP: 139/86  Pulse: 75  Resp: 18  Temp: 36.4 C    Last Pain:  Vitals:   05/26/17 0706  TempSrc: Oral      Patients Stated Pain Goal: 2 (52/17/47 1595)  Complications: No apparent anesthesia complications

## 2017-05-26 NOTE — Op Note (Signed)
Procedure Note  Pre-operative Diagnosis:  Thyroid goiter with compressive symptoms, hyperthyroidism  Post-operative Diagnosis:  same  Surgeon:  Earnstine Regal, MD, FACS  Assistant:  none   Procedure:  Total thyroidectomy  Anesthesia:  General  Estimated Blood Loss:  150 cc  Drains: none         Specimen: thyroid to pathology  Indications:  The patient is a 83 year old male who presents with a complaint of Enlarged thyroid.  Patient is referred by Dr. Virl Axe for evaluation of thyroid goiter with compressive symptoms and hyperthyroidism. Patient states that he has had a thyroid goiter for a number of years. It is gradually increased in size. He has developed compressive symptoms including dysphagia and dyspnea. Patient also has hyperthyroidism and is currently taking methimazole under the direction of his endocrinologist in [facility]. Patient has had no prior surgery on the head or neck. He does have a significant cardiac history and has a pacemaker and defibrillator in place. He is anticoagulated. Patient had a CT scan of the chest performed on Apr 25, 2017. This showed a very enlarged heterogeneous and nodular thyroid with tracheal narrowing. Patient also had an element of venous congestion possibly due to stricture versus compression by the thyroid.   Procedure Details: Procedure was done in OR #2 at the [facility].  The patient was brought to the operating room and placed in a supine position on the operating room table.  Following administration of general anesthesia, the patient was positioned and then prepped and draped in the usual aseptic fashion.  After ascertaining that an adequate level of anesthesia had been achieved, a Kocher incision was made with #15 blade.  Dissection was carried through subcutaneous tissues and platysma. Hemostasis was achieved with the electrocautery.  Skin flaps were elevated cephalad and caudad from the thyroid notch to the sternal  notch.  The Mahorner self-retaining retractor was placed for exposure.  Strap muscles were incised in the midline and dissection was begun on the left side.  Strap muscles were reflected laterally.  Left thyroid lobe was markedly enlarged with a significant inferior component extending into the posterior mediastinum.  The left lobe was gently mobilized with blunt dissection.  Superior pole vessels were dissected out and divided individually between small and medium Ligaclips with the Harmonic scalpel.  The thyroid lobe was rolled anteriorly.  Branches of the inferior thyroid artery were divided between small Ligaclips with the Harmonic scalpel.  Inferior venous tributaries were divided between Ligaclips.  Both the superior and inferior parathyroid glands were identified and preserved on their vascular pedicles.  The recurrent laryngeal nerve was identified and preserved along its course.  The ligament of Gwenlyn Found was released with the electrocautery and the gland was mobilized onto the anterior trachea. Isthmus was mobilized across the midline.  There was moderate sized pyramidal lobe present which was dissected off of the thyroid cartilage and resected with the isthmus.  Dry pack was placed in the left neck.  Next, the right thyroid lobe was gently mobilized with blunt dissection.  Right thyroid lobe was massively enlarged with the superior pole extending very high into the right neck.  Superior pole vessels were dissected out and divided between small and medium Ligaclips with the Harmonic scalpel.  Superior parathyroid was identified and preserved.  Inferior venous tributaries were divided between medium Ligaclips with the Harmonic scalpel.  The right thyroid lobe was rolled anteriorly and the branches of the inferior thyroid artery divided between  small Ligaclips.  The right recurrent laryngeal nerve was identified and preserved along its course.  The ligament of Gwenlyn Found was released with the electrocautery.  The  right thyroid lobe was mobilized onto the anterior trachea and the remainder of the thyroid was dissected off the anterior trachea and the thyroid was completely excised.  A suture was used to mark the right lobe. The entire thyroid gland was submitted to pathology for review.  The neck was irrigated with warm saline.  Fibrillar was placed throughout the operative field.  Strap muscles were reapproximated in the midline with interrupted 3-0 Vicryl sutures.  Platysma was closed with interrupted 3-0 Vicryl sutures.  Skin was closed with a running 4-0 Monocryl subcuticular suture.  Wound was washed and dried and steri-strips were applied.  Dry gauze dressing was placed.  The patient was awakened from anesthesia and brought to the recovery room.  The patient tolerated the procedure well.   Earnstine Regal, MD, [facility] Office: [phone]

## 2017-05-27 ENCOUNTER — Encounter (HOSPITAL_COMMUNITY): Payer: Self-pay | Admitting: Surgery

## 2017-05-27 DIAGNOSIS — E0520 Thyrotoxicosis with toxic multinodular goiter without thyrotoxic crisis or storm: Secondary | ICD-10-CM | POA: Diagnosis not present

## 2017-05-27 DIAGNOSIS — I10 Essential (primary) hypertension: Secondary | ICD-10-CM | POA: Diagnosis not present

## 2017-05-27 DIAGNOSIS — J45909 Unspecified asthma, uncomplicated: Secondary | ICD-10-CM | POA: Diagnosis not present

## 2017-05-27 DIAGNOSIS — K219 Gastro-esophageal reflux disease without esophagitis: Secondary | ICD-10-CM | POA: Diagnosis not present

## 2017-05-27 DIAGNOSIS — I2510 Atherosclerotic heart disease of native coronary artery without angina pectoris: Secondary | ICD-10-CM | POA: Diagnosis not present

## 2017-05-27 DIAGNOSIS — Z950 Presence of cardiac pacemaker: Secondary | ICD-10-CM | POA: Diagnosis not present

## 2017-05-27 LAB — BASIC METABOLIC PANEL
Anion gap: 8 (ref 5–15)
BUN: 13 mg/dL (ref 6–20)
CHLORIDE: 101 mmol/L (ref 101–111)
CO2: 27 mmol/L (ref 22–32)
CREATININE: 0.92 mg/dL (ref 0.61–1.24)
Calcium: 8.9 mg/dL (ref 8.9–10.3)
GFR calc Af Amer: >60 mL/min (ref >60)
GFR calc non Af Amer: >60 mL/min (ref >60)
GLUCOSE: 116 mg/dL — AB (ref 65–99)
Potassium: 3.7 mmol/L (ref 3.5–5.1)
SODIUM: 136 mmol/L (ref 135–145)

## 2017-05-27 MED ORDER — CALCIUM CARBONATE ANTACID 500 MG PO CHEW: 2.0000 | CHEWABLE_TABLET | Freq: Two times a day (BID) | ORAL | 1 refills | Status: DC

## 2017-05-27 MED ORDER — HYDROCODONE-ACETAMINOPHEN 5-325 MG PO TABS: 1.0000 | ORAL_TABLET | ORAL | 0 refills | Status: DC | PRN

## 2017-05-27 MED ORDER — LEVOTHYROXINE SODIUM 88 MCG PO TABS: 88.0000 ug | ORAL_TABLET | Freq: Every day | ORAL | 3 refills | Status: DC

## 2017-05-27 NOTE — Discharge Instructions (Signed)
[facility] ° °THYROID & PARATHYROID SURGERY:  POST-OP INSTRUCTIONS ° °Always review your discharge instruction sheet from the facility where your surgery was performed. ° °A prescription for pain medication may be given to you upon discharge.  Take your pain medication as prescribed.  If narcotic pain medicine is not needed, then you may take acetaminophen (Tylenol) or ibuprofen (Advil) as needed. ° °Take your usually prescribed medications unless otherwise directed. ° °If you need a refill on your pain medication, please contact our office during regular business hours.  Prescriptions will not be processed by our office after 5 pm or on weekends. ° °Start with a light diet upon arrival home, such as soup and crackers or toast.  Be sure to drink plenty of fluids daily.  Resume your normal diet the day after surgery. ° °Most patients will experience some swelling and bruising on the chest and neck area.  Ice packs will help.  Swelling and bruising can take several days to resolve.  ° °It is common to experience some constipation after surgery.  Increasing fluid intake and taking a stool softener (Colace) will usually help or prevent this problem.  A mild laxative (Milk of Magnesia or Miralax) should be taken according to package directions if there has been no bowel movement after 48 hours. ° °You have steri-strips and a gauze dressing over your incision.  You may remove the gauze bandage on the second day after surgery, and you may shower at that time.  Leave your steri-strips (small skin tapes) in place directly over the incision.  These strips should remain on the skin for 5-7 days and then be removed.  You may get them wet in the shower and pat them dry. ° °You may resume regular (light) daily activities beginning the next day - such as daily self-care, walking, climbing stairs - gradually increasing activities as tolerated.  You may have sexual intercourse when it is comfortable.  Refrain  from any heavy lifting or straining until approved by your doctor.  You may drive when you no longer are taking prescription pain medication, you can comfortably wear a seatbelt, and you can safely maneuver your car and apply brakes. ° °You should see your doctor in the office for a follow-up appointment approximately three weeks after your surgery.  Make sure that you call for this appointment within a day or two after you arrive home to insure a convenient appointment time. ° °WHEN TO CALL YOUR DOCTOR: °-- Fever greater than 101.5 °-- Inability to urinate °-- Nausea and/or vomiting - persistent °-- Extreme swelling or bruising °-- Continued bleeding from incision °-- Increased pain, redness, or drainage from the incision °-- Difficulty swallowing or breathing °-- Muscle cramping or spasms °-- Numbness or tingling in hands or around lips ° °The clinic staff is available to answer your questions during regular business hours.  Please don’t hesitate to call and ask to speak to one of the nurses if you have concerns. ° °Kelis Plasse M. Brion Sossamon, MD, FACS °General & Endocrine Surgery °[facility] °Office: [phone] ° °Website: [email or web address] ° ° °

## 2017-05-27 NOTE — Progress Notes (Signed)
Discussed with the patient and all questioned fully answered. He will call me if any problems arise.  IV removed. Telemetry removed, CCMD notified. Gauze applied to neck incision per Dr. Gala Lewandowsky request. Pt sent home with ice packs per orders. Pt given discharge envelope with discharge paperwork and prescriptions.   Fritz Pickerel, RN

## 2017-05-27 NOTE — Discharge Summary (Signed)
Physician Discharge Summary [facility]  Patient ID: Cody Gomez MRN: [id number] DOB/AGE: Jan 05, 1931 83 y.o.  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  Post op course was uncomplicated.  Pain was well controlled.  Tolerated diet.  Post op calcium level on morning following surgery was 8.9 mg/dl.  Patient was prepared for discharge home on POD#1.  Consults: None  Treatments: surgery: total thyroidectomy  Discharge Exam: Blood pressure 126/83, pulse 87, temperature 98.1 F (36.7 C), temperature source Oral, resp. rate 18, height 5\' 7"  (1.702 m), weight 93.4 kg (206 lb), SpO2 93 %. HEENT - clear Neck - wound dry and intact, steri-strips in place Chest - clear bilaterally Cor - RRR  Disposition: Home  Discharge Instructions    Diet - low sodium heart healthy    Complete by:  As directed    Discharge instructions    Complete by:  As directed    [facility]  THYROID & PARATHYROID SURGERY:  POST-OP INSTRUCTIONS  Always review your discharge instruction sheet from the facility where your surgery was performed.  A prescription for pain medication may be given to you upon discharge.  Take your pain medication as prescribed.  If narcotic pain medicine is not needed, then you may take acetaminophen (Tylenol) or ibuprofen (Advil) as needed.  Take your usually prescribed medications unless otherwise directed.  If you need a refill on your pain medication, please contact our office during regular business hours.  Prescriptions will not be processed by our office after 5 pm or on  weekends.  Start with a light diet upon arrival home, such as soup and crackers or toast.  Be sure to drink plenty of fluids daily.  Resume your normal diet the day after surgery.  Most patients will experience some swelling and bruising on the chest and neck area.  Ice packs will help.  Swelling and bruising can take several days to resolve.   It is common to experience some constipation after surgery.  Increasing fluid intake and taking a stool softener (Colace) will usually help or prevent this problem.  A mild laxative (Milk of Magnesia or Miralax) should be taken according to package directions if there has been no bowel movement after 48 hours.  You have steri-strips and a gauze dressing over your incision.  You may remove the gauze bandage on the second day after surgery, and you may shower at that time.  Leave your steri-strips (small skin tapes) in place directly over the incision.  These strips should remain on the skin for 5-7 days and then be removed.  You may get them wet in the shower and pat them dry.  You may resume regular (light) daily activities beginning the next day - such as daily self-care, walking, climbing stairs - gradually increasing activities as tolerated.  You may have sexual intercourse when it is comfortable.  Refrain from any heavy lifting or straining until approved by your doctor.  You may drive when you no longer are taking prescription pain medication, you can comfortably wear a seatbelt, and you can safely maneuver your car and apply brakes.  You should see your doctor in the office for a follow-up  appointment approximately three weeks after your surgery.  Make sure that you call for this appointment within a day or two after you arrive home to insure a convenient appointment time.  WHEN TO CALL YOUR DOCTOR: -- Fever greater than 101.5 -- Inability to urinate -- Nausea and/or vomiting - persistent -- Extreme swelling or bruising -- Continued bleeding from  incision -- Increased pain, redness, or drainage from the incision -- Difficulty swallowing or breathing -- Muscle cramping or spasms -- Numbness or tingling in hands or around lips  The clinic staff is available to answer your questions during regular business hours.  Please don't hesitate to call and ask to speak to one of the nurses if you have concerns.  Earnstine Regal, MD, [facility] Office: [phone]  Website: [email or web address]   Ice pack    Complete by:  As directed    Increase activity slowly    Complete by:  As directed    Remove dressing in 24 hours    Complete by:  As directed      Allergies as of 05/27/2017      Reactions   Dobutamine Other (See Comments)   ASYSTOLE   Penicillins Swelling   SWELLING AT INJECTION SITE Has patient had a PCN reaction causing immediate rash, facial/tongue/throat swelling, SOB or lightheadedness with hypotension: No Has patient had a PCN reaction causing severe rash involving mucus membranes or skin necrosis: No Has patient had a PCN reaction that required hospitalization: No Has patient had a PCN reaction occurring within the last 10 years: No If all of the above answers are "NO", then may proceed with Cephalosporin use.      Medication List    TAKE these medications   albuterol (2.5 MG/3ML) 0.083% nebulizer solution Commonly known as:  PROVENTIL Take 3 mLs (2.5 mg total) by nebulization every 6 (six) hours as needed for shortness of breath.   ALEVE 220 MG tablet Generic drug:  naproxen sodium Take 440 mg by mouth 2 (two) times daily as needed (pain or headache).   calcium carbonate 500 MG chewable tablet Commonly known as:  TUMS Chew 2 tablets (400 mg of elemental calcium total) by mouth 2 (two) times daily.   carvedilol 12.5 MG tablet Commonly known as:  COREG Take 1 tablet (12.5 mg total) by mouth 2 (two) times daily.   dofetilide 500 MCG  capsule Commonly known as:  TIKOSYN TAKE 1 CAPSULE BY MOUTH TWICE DAILY   furosemide 40 MG tablet Commonly known as:  LASIX Take 40 mg by mouth daily.   HYDROcodone-acetaminophen 5-325 MG tablet Commonly known as:  NORCO/VICODIN Take 1-2 tablets by mouth every 4 (four) hours as needed for moderate pain.   levothyroxine 88 MCG tablet Commonly known as:  SYNTHROID Take 1 tablet (88 mcg total) by mouth daily before breakfast.   mexiletine 150 MG capsule Commonly known as:  MEXITIL TAKE 1 CAPSULE(150 MG) BY MOUTH TWICE DAILY   montelukast 10 MG tablet Commonly known as:  SINGULAIR Take 10 mg by mouth at bedtime.   NITROSTAT 0.4 MG SL tablet Generic drug:  nitroGLYCERIN Place 1 tablet under the tongue every 5 (five) minutes as needed for chest pain.   potassium chloride SA 20 MEQ tablet Commonly known as:  K-DUR,KLOR-CON Take 1 tablet (20 mEq total) by mouth every other day. And as directed What changed:  when to take this  additional instructions   SYMBICORT 160-4.5 MCG/ACT inhaler Generic drug:  budesonide-formoterol Inhale 1 puff into the lungs 2 (two) times daily as needed (shortness of breath).   warfarin 5 MG tablet Commonly known as:  COUMADIN Take 2.5-5 mg by mouth at bedtime. Takes 2.5 mg (1/2 tab) on Monday, Wednesday, Friday, and Sunday. Takes 5 mg on Tuesday, Thursday, and Saturday      Follow-up Information    Armandina Gemma, MD. Schedule an appointment as soon as possible for a visit in 3 week(s).   Specialty:  General Surgery Contact information: [address] [place] [address] [phone]           Earnstine Regal, MD, [facility] Office: [phone]   Signed: Earnstine Regal 05/27/2017, 11:50 AM

## 2017-05-30 NOTE — Progress Notes (Signed)
Please contact patient and notify of benign pathology results.  Lizzie Cokley M. Alexandre Faries, MD, FACS [facility] Office: [phone]   

## 2017-06-02 ENCOUNTER — Telehealth: Payer: Self-pay

## 2017-06-02 NOTE — Telephone Encounter (Signed)
Message sent to Alvis Lemmings, Dr Olin Pia nurse regarding appointment.  She reported he does not need an appointment with Tommye Standard, PA.  She stated she will discuss with Dr [name] next week about making an appointment to reschedule CRT upgrade procedure.

## 2017-06-02 NOTE — Telephone Encounter (Signed)
Call to patient.  Advised I spoke with  Alvis Lemmings, Dr Olin Pia nurse regarding appointment.  He does not need an appointment with Tommye Standard, PA.  Nira Conn will discuss with Dr [name] next week about making an appointment to reschedule CRT upgrade procedure.  He said that would be fine.  Reminded him ICM remote transmission from home will be 06/09/2017.  He thanked me for the info.

## 2017-06-02 NOTE — Telephone Encounter (Signed)
Received voice mail message from patient asking why he has a notification to make an appointment with Tommye Standard, PA.  He would like a call back in regards to the appointment.

## 2017-06-03 DIAGNOSIS — I420 Dilated cardiomyopathy: Secondary | ICD-10-CM | POA: Diagnosis not present

## 2017-06-03 DIAGNOSIS — E890 Postprocedural hypothyroidism: Secondary | ICD-10-CM | POA: Diagnosis not present

## 2017-06-03 DIAGNOSIS — I482 Chronic atrial fibrillation: Secondary | ICD-10-CM | POA: Diagnosis not present

## 2017-06-03 DIAGNOSIS — Z7901 Long term (current) use of anticoagulants: Secondary | ICD-10-CM | POA: Diagnosis not present

## 2017-06-08 DIAGNOSIS — E0590 Thyrotoxicosis, unspecified without thyrotoxic crisis or storm: Secondary | ICD-10-CM | POA: Diagnosis not present

## 2017-06-09 ENCOUNTER — Ambulatory Visit (INDEPENDENT_AMBULATORY_CARE_PROVIDER_SITE_OTHER): Payer: Medicare Other

## 2017-06-09 ENCOUNTER — Telehealth: Payer: Self-pay

## 2017-06-09 DIAGNOSIS — I5022 Chronic systolic (congestive) heart failure: Secondary | ICD-10-CM | POA: Diagnosis not present

## 2017-06-09 DIAGNOSIS — Z7901 Long term (current) use of anticoagulants: Secondary | ICD-10-CM | POA: Diagnosis not present

## 2017-06-09 DIAGNOSIS — Z95810 Presence of automatic (implantable) cardiac defibrillator: Secondary | ICD-10-CM | POA: Diagnosis not present

## 2017-06-09 NOTE — Progress Notes (Signed)
EPIC Encounter for ICM Monitoring  Patient Name: TAEVION SIKORA is a 83 y.o. male Date: 06/09/2017 Primary Care Physican: Cyndy Freeze, MD Primary [place] Electrophysiologist: Faustino Congress Weight:unknown            Attempted call to patient and unable to reach.  Left detailed message regarding transmission.  Transmission reviewed.   Thoracic impedance normal since 06/02/2017.  Prescribed dosage: Furosemide 40 mg every day. Potassium 20 mEq every other day as directed.  Labs: 02/23/2017 Creatinine 0.91, BUN 15, Potassium 4.0, Sodium 140, EGFR >60 02/18/2017 Creatinine 1.00, BUN 17, Potassium 4.5, Sodium 138, EGFR 69-80  01/20/2017 Creatinine 0.94, BUN 16, Potassium 3.8, Sodium 138, EGFR 75-86  01/19/2017 Creatinine 0.87, BUN 18, Potassium 4.7, Sodium 140   Recommendations: Left voice mail with ICM number and encouraged to call for fluid symptoms.  Follow-up plan: ICM clinic phone appointment on 07/12/2017.    Copy of ICM check sent to device physician.   3 month ICM trend: 06/09/2017    1 Year ICM trend:      Rosalene Billings, RN 06/09/2017 10:39 AM

## 2017-06-09 NOTE — Telephone Encounter (Signed)
Remote ICM transmission received.  Attempted patient call and left detailed message regarding transmission and next ICM scheduled for 07/12/2017.  Advised to return call for any fluid symptoms or questions.

## 2017-06-17 DIAGNOSIS — E0590 Thyrotoxicosis, unspecified without thyrotoxic crisis or storm: Secondary | ICD-10-CM | POA: Diagnosis not present

## 2017-06-20 DIAGNOSIS — R0602 Shortness of breath: Secondary | ICD-10-CM | POA: Diagnosis not present

## 2017-06-24 ENCOUNTER — Encounter: Payer: Self-pay | Admitting: Internal Medicine

## 2017-06-24 ENCOUNTER — Ambulatory Visit (INDEPENDENT_AMBULATORY_CARE_PROVIDER_SITE_OTHER): Payer: Medicare Other | Admitting: Internal Medicine

## 2017-06-24 VITALS — BP 130/68 | HR 84 | Ht 67.0 in | Wt 200.0 lb

## 2017-06-24 DIAGNOSIS — I428 Other cardiomyopathies: Secondary | ICD-10-CM

## 2017-06-24 DIAGNOSIS — I5022 Chronic systolic (congestive) heart failure: Secondary | ICD-10-CM | POA: Diagnosis not present

## 2017-06-24 DIAGNOSIS — Z95810 Presence of automatic (implantable) cardiac defibrillator: Secondary | ICD-10-CM | POA: Diagnosis not present

## 2017-06-24 DIAGNOSIS — Z79899 Other long term (current) drug therapy: Secondary | ICD-10-CM | POA: Diagnosis not present

## 2017-06-24 DIAGNOSIS — I472 Ventricular tachycardia, unspecified: Secondary | ICD-10-CM

## 2017-06-24 DIAGNOSIS — I480 Paroxysmal atrial fibrillation: Secondary | ICD-10-CM | POA: Diagnosis not present

## 2017-06-24 MED ORDER — SACUBITRIL-VALSARTAN 24-26 MG PO TABS: 1.0000 | ORAL_TABLET | Freq: Two times a day (BID) | ORAL | 0 refills | Status: DC

## 2017-06-24 MED ORDER — SACUBITRIL-VALSARTAN 24-26 MG PO TABS: 1.0000 | ORAL_TABLET | Freq: Two times a day (BID) | ORAL | Status: DC

## 2017-06-24 MED ORDER — POTASSIUM CHLORIDE CRYS ER 20 MEQ PO TBCR: EXTENDED_RELEASE_TABLET | ORAL | 3 refills | Status: DC

## 2017-06-24 NOTE — Patient Instructions (Addendum)
Medication Instructions: - Your physician has recommended you make the following change in your medication:  1) Start entresto 24/26 mg- take one tablet by mouth twice daily 2) Increase potassium 20 meq- take 2 tablets (40 meq) on Mondays, Wednesdays, and Fridays and 1 tablet (20 meq) all other days of the week  Labwork: - none ordered  Procedures/Testing: - Your physician recommends that you have a right upper extremity CT scan. Nira Conn, RN will call you to arrange this  Follow-Up: - pending results of the CT scan  Any Additional Special Instructions Will Be Listed Below (If Applicable).     If you need a refill on your cardiac medications before your next appointment, please call your pharmacy.
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
## 2017-06-29 DIAGNOSIS — Z7901 Long term (current) use of anticoagulants: Secondary | ICD-10-CM | POA: Diagnosis not present

## 2017-07-04 DIAGNOSIS — J189 Pneumonia, unspecified organism: Secondary | ICD-10-CM | POA: Diagnosis not present

## 2017-07-12 ENCOUNTER — Telehealth: Payer: Self-pay | Admitting: Cardiology

## 2017-07-12 ENCOUNTER — Ambulatory Visit (INDEPENDENT_AMBULATORY_CARE_PROVIDER_SITE_OTHER): Payer: Medicare Other

## 2017-07-12 DIAGNOSIS — Z95810 Presence of automatic (implantable) cardiac defibrillator: Secondary | ICD-10-CM | POA: Diagnosis not present

## 2017-07-12 DIAGNOSIS — I5022 Chronic systolic (congestive) heart failure: Secondary | ICD-10-CM

## 2017-07-12 NOTE — Telephone Encounter (Signed)
LMOVM reminding pt to send remote transmission.   

## 2017-07-14 ENCOUNTER — Telehealth: Payer: Self-pay | Admitting: Internal Medicine

## 2017-07-14 ENCOUNTER — Telehealth: Payer: Self-pay

## 2017-07-14 ENCOUNTER — Encounter: Payer: Self-pay | Admitting: *Deleted

## 2017-07-14 DIAGNOSIS — I428 Other cardiomyopathies: Secondary | ICD-10-CM

## 2017-07-14 DIAGNOSIS — Z01812 Encounter for preprocedural laboratory examination: Secondary | ICD-10-CM

## 2017-07-14 LAB — CUP PACEART INCLINIC DEVICE CHECK
Battery Remaining Longevity: 38 mo
Battery Voltage: 2.96 V
Brady Statistic AP VP Percent: 88.97 %
Brady Statistic AP VS Percent: 2.23 %
Brady Statistic AS VP Percent: 8.4 %
Brady Statistic AS VS Percent: 0.4 %
Brady Statistic RA Percent Paced: 91.01 %
Brady Statistic RV Percent Paced: 97.22 %
Date Time Interrogation Session: 20180713143350
HighPow Impedance: 63 Ohm
HighPow Impedance: 83 Ohm
Implantable Lead Implant Date: 20080805
Implantable Lead Implant Date: 20080805
Implantable Lead Location: 753859
Implantable Lead Location: 753860
Implantable Lead Model: 4076
Implantable Lead Model: 7001
Implantable Pulse Generator Implant Date: 20140711
Lead Channel Impedance Value: 456 Ohm
Lead Channel Impedance Value: 475 Ohm
Lead Channel Impedance Value: 589 Ohm
Lead Channel Pacing Threshold Amplitude: 0.375 V
Lead Channel Pacing Threshold Amplitude: 1.5 V
Lead Channel Pacing Threshold Pulse Width: 0.4 ms
Lead Channel Pacing Threshold Pulse Width: 0.4 ms
Lead Channel Sensing Intrinsic Amplitude: 10.625 mV
Lead Channel Sensing Intrinsic Amplitude: 10.625 mV
Lead Channel Sensing Intrinsic Amplitude: 2.375 mV
Lead Channel Sensing Intrinsic Amplitude: 3.625 mV
Lead Channel Setting Pacing Amplitude: 2 V
Lead Channel Setting Pacing Amplitude: 3 V
Lead Channel Setting Pacing Pulse Width: 0.4 ms
Lead Channel Setting Sensing Sensitivity: 1.2 mV

## 2017-07-14 NOTE — Telephone Encounter (Signed)
Returned call to patient as requested by voice mail.  He has not been able to send remote transmission and was told the landline would no longer work. He is now using Wirex adapter but still unable to send.  His monitor was not plugged in and he fixed that during conversation.  He will try to send transmission now.  Provided tech number and encouraged him to call if he could not send a transmission.

## 2017-07-14 NOTE — Telephone Encounter (Signed)
Further reviewed procedure with Dr. [name] as I was unsure of the order to place for this procedure. Per Dr. [name], he called back to interventional radiology and was told that the recommendation would be just to proceed with a venogram.  Order received from Dr. [name] to proceed with scheduling the patient for his CRT-D upgrade and he will do a venogram in the lab vs sending the patient through radiology.  I have spoken with the patient and he is aware of the above. He would like to have his procedure on 07/28/17 with Dr. [name]- I advised I would schedule for this date- he will come to the office on 07/22/17 for pre-procedure labs.   He is aware that a detailed letter of instructions will be mailed to him. The patient is agreeable.

## 2017-07-14 NOTE — Telephone Encounter (Signed)
Spoke with patient regarding ATP episode on 7/13. Patient reports taking all medication as prescribed. Patient aware of driving restrictions and asked several questions about CRTD upgrade as well as a CT scan. Will message [facility] nurse for follow up.

## 2017-07-14 NOTE — Telephone Encounter (Signed)
Per Dr. Olin Pia discussion with radiology- schedule patient for a CTA venous phase to assess for right upper extremity venous access post thyroid surgery.
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
## 2017-07-22 ENCOUNTER — Other Ambulatory Visit: Payer: Medicare Other | Admitting: *Deleted

## 2017-07-22 DIAGNOSIS — Z01812 Encounter for preprocedural laboratory examination: Secondary | ICD-10-CM | POA: Diagnosis not present

## 2017-07-22 DIAGNOSIS — I428 Other cardiomyopathies: Secondary | ICD-10-CM

## 2017-07-22 LAB — CBC WITH DIFFERENTIAL/PLATELET
Basophils Absolute: 0 10*3/uL (ref 0.0–0.2)
Basos: 1 %
EOS (ABSOLUTE): 0.3 10*3/uL (ref 0.0–0.4)
Eos: 3 %
HEMATOCRIT: 42.4 % (ref 37.5–51.0)
Hemoglobin: 14.1 g/dL (ref 13.0–17.7)
IMMATURE GRANS (ABS): 0 10*3/uL (ref 0.0–0.1)
Immature Granulocytes: 0 %
LYMPHS ABS: 1.8 10*3/uL (ref 0.7–3.1)
LYMPHS: 24 %
MCH: 30.1 pg (ref 26.6–33.0)
MCHC: 33.3 g/dL (ref 31.5–35.7)
MCV: 91 fL (ref 79–97)
MONOCYTES: 11 %
Monocytes Absolute: 0.8 10*3/uL (ref 0.1–0.9)
NEUTROS ABS: 4.5 10*3/uL (ref 1.4–7.0)
Neutrophils: 61 %
Platelets: 217 10*3/uL (ref 150–379)
RBC: 4.68 x10E6/uL (ref 4.14–5.80)
RDW: 14.7 % (ref 12.3–15.4)
WBC: 7.3 10*3/uL (ref 3.4–10.8)

## 2017-07-22 LAB — BASIC METABOLIC PANEL
BUN / CREAT RATIO: 21 (ref 10–24)
BUN: 21 mg/dL (ref 8–27)
CO2: 22 mmol/L (ref 20–29)
Calcium: 9.2 mg/dL (ref 8.6–10.2)
Chloride: 103 mmol/L (ref 96–106)
Creatinine, Ser: 1 mg/dL (ref 0.76–1.27)
GFR calc Af Amer: 80 mL/min/{1.73_m2} (ref >59)
GFR calc non Af Amer: 69 mL/min/{1.73_m2} (ref >59)
GLUCOSE: 85 mg/dL (ref 65–99)
Potassium: 4.1 mmol/L (ref 3.5–5.2)
SODIUM: 141 mmol/L (ref 134–144)

## 2017-07-22 LAB — PROTIME-INR
INR: 2 — AB (ref 0.8–1.2)
PROTHROMBIN TIME: 20.4 s — AB (ref 9.1–12.0)

## 2017-07-26 ENCOUNTER — Telehealth: Payer: Self-pay | Admitting: Internal Medicine

## 2017-07-26 ENCOUNTER — Other Ambulatory Visit: Payer: Self-pay | Admitting: Internal Medicine

## 2017-07-26 NOTE — Telephone Encounter (Signed)
Called the patient with his pre-procedure INR results. He is aware per Dr. [name] that he does not need to hold warfarin prior to procedure on 07/28/17.

## 2017-07-28 ENCOUNTER — Encounter (HOSPITAL_COMMUNITY)
Admission: RE | Disposition: A | Discharge status: Home / Self Care | Payer: Self-pay | Source: Ambulatory Visit | Attending: Internal Medicine

## 2017-07-28 ENCOUNTER — Encounter (HOSPITAL_COMMUNITY): Payer: Self-pay | Admitting: Internal Medicine

## 2017-07-28 ENCOUNTER — Ambulatory Visit (HOSPITAL_COMMUNITY)
Admission: RE | Admit: 2017-07-28 | Discharge: 2017-07-29 | Disposition: A | Discharge status: Home / Self Care | Payer: Medicare Other | Source: Ambulatory Visit | Attending: Internal Medicine | Admitting: Internal Medicine

## 2017-07-28 DIAGNOSIS — I429 Cardiomyopathy, unspecified: Secondary | ICD-10-CM | POA: Insufficient documentation

## 2017-07-28 DIAGNOSIS — Z7951 Long term (current) use of inhaled steroids: Secondary | ICD-10-CM | POA: Insufficient documentation

## 2017-07-28 DIAGNOSIS — Z8546 Personal history of malignant neoplasm of prostate: Secondary | ICD-10-CM | POA: Insufficient documentation

## 2017-07-28 DIAGNOSIS — I472 Ventricular tachycardia, unspecified: Secondary | ICD-10-CM

## 2017-07-28 DIAGNOSIS — I255 Ischemic cardiomyopathy: Secondary | ICD-10-CM | POA: Diagnosis not present

## 2017-07-28 DIAGNOSIS — I2510 Atherosclerotic heart disease of native coronary artery without angina pectoris: Secondary | ICD-10-CM | POA: Insufficient documentation

## 2017-07-28 DIAGNOSIS — Z7901 Long term (current) use of anticoagulants: Secondary | ICD-10-CM | POA: Insufficient documentation

## 2017-07-28 DIAGNOSIS — I5022 Chronic systolic (congestive) heart failure: Secondary | ICD-10-CM | POA: Insufficient documentation

## 2017-07-28 DIAGNOSIS — I871 Compression of vein: Secondary | ICD-10-CM | POA: Diagnosis present

## 2017-07-28 DIAGNOSIS — I4891 Unspecified atrial fibrillation: Secondary | ICD-10-CM | POA: Insufficient documentation

## 2017-07-28 DIAGNOSIS — I110 Hypertensive heart disease with heart failure: Secondary | ICD-10-CM | POA: Insufficient documentation

## 2017-07-28 DIAGNOSIS — E785 Hyperlipidemia, unspecified: Secondary | ICD-10-CM | POA: Diagnosis not present

## 2017-07-28 DIAGNOSIS — I252 Old myocardial infarction: Secondary | ICD-10-CM | POA: Diagnosis not present

## 2017-07-28 DIAGNOSIS — G4733 Obstructive sleep apnea (adult) (pediatric): Secondary | ICD-10-CM | POA: Diagnosis not present

## 2017-07-28 DIAGNOSIS — I509 Heart failure, unspecified: Secondary | ICD-10-CM

## 2017-07-28 DIAGNOSIS — Z87891 Personal history of nicotine dependence: Secondary | ICD-10-CM | POA: Diagnosis not present

## 2017-07-28 DIAGNOSIS — Z85828 Personal history of other malignant neoplasm of skin: Secondary | ICD-10-CM | POA: Insufficient documentation

## 2017-07-28 DIAGNOSIS — I480 Paroxysmal atrial fibrillation: Secondary | ICD-10-CM | POA: Diagnosis not present

## 2017-07-28 DIAGNOSIS — I442 Atrioventricular block, complete: Secondary | ICD-10-CM | POA: Insufficient documentation

## 2017-07-28 DIAGNOSIS — Z880 Allergy status to penicillin: Secondary | ICD-10-CM | POA: Diagnosis not present

## 2017-07-28 DIAGNOSIS — Z959 Presence of cardiac and vascular implant and graft, unspecified: Secondary | ICD-10-CM

## 2017-07-28 DIAGNOSIS — Z95810 Presence of automatic (implantable) cardiac defibrillator: Secondary | ICD-10-CM | POA: Diagnosis not present

## 2017-07-28 DIAGNOSIS — I428 Other cardiomyopathies: Secondary | ICD-10-CM | POA: Diagnosis not present

## 2017-07-28 DIAGNOSIS — E039 Hypothyroidism, unspecified: Secondary | ICD-10-CM | POA: Insufficient documentation

## 2017-07-28 DIAGNOSIS — Z79899 Other long term (current) drug therapy: Secondary | ICD-10-CM | POA: Insufficient documentation

## 2017-07-28 HISTORY — DX: Acute myocardial infarction, unspecified: I21.9

## 2017-07-28 HISTORY — DX: Hypothyroidism, unspecified: E03.9

## 2017-07-28 HISTORY — DX: Personal history of peptic ulcer disease: Z87.11

## 2017-07-28 HISTORY — DX: Heart failure, unspecified: I50.9

## 2017-07-28 HISTORY — DX: Personal history of other diseases of the digestive system: Z87.19

## 2017-07-28 HISTORY — PX: BIV UPGRADE: EP1202

## 2017-07-28 LAB — SURGICAL PCR SCREEN
MRSA, PCR: NEGATIVE
STAPHYLOCOCCUS AUREUS: NEGATIVE

## 2017-07-28 LAB — TSH: TSH: 13.694 u[IU]/mL — ABNORMAL HIGH (ref 0.350–4.500)

## 2017-07-28 LAB — PROTIME-INR
INR: 1.77
Prothrombin Time: 20.8 seconds — ABNORMAL HIGH (ref 11.4–15.2)

## 2017-07-28 SURGERY — BIV UPGRADE

## 2017-07-28 MED ORDER — FENTANYL CITRATE (PF) 100 MCG/2ML IJ SOLN
INTRAMUSCULAR | Status: DC | PRN
  Administered 2017-07-28 (×2): 25 ug via INTRAVENOUS

## 2017-07-28 MED ORDER — ACETAMINOPHEN 325 MG PO TABS
325.0000 mg | ORAL_TABLET | ORAL | Status: DC | PRN
  Administered 2017-07-28: 650 mg via ORAL
  Filled 2017-07-28: qty 2

## 2017-07-28 MED ORDER — LIDOCAINE HCL (PF) 1 % IJ SOLN
INTRAMUSCULAR | Status: AC
  Filled 2017-07-28: qty 60

## 2017-07-28 MED ORDER — IOPAMIDOL (ISOVUE-370) INJECTION 76%
INTRAVENOUS | Status: AC
  Filled 2017-07-28: qty 50

## 2017-07-28 MED ORDER — GUAIFENESIN ER 600 MG PO TB12: 1200.0000 mg | ORAL_TABLET | Freq: Every day | ORAL | Status: DC | PRN

## 2017-07-28 MED ORDER — HYDROCORTISONE 1 % EX CREA
TOPICAL_CREAM | CUTANEOUS | Status: DC | PRN
  Administered 2017-07-28: 23:00:00 via TOPICAL
  Filled 2017-07-28: qty 28

## 2017-07-28 MED ORDER — SODIUM CHLORIDE 0.9 % IR SOLN
Status: DC | PRN
  Administered 2017-07-28: 12:00:00

## 2017-07-28 MED ORDER — WARFARIN SODIUM 5 MG PO TABS: 2.5000 mg | ORAL_TABLET | ORAL | Status: DC

## 2017-07-28 MED ORDER — ONDANSETRON HCL 4 MG/2ML IJ SOLN: 4.0000 mg | Freq: Four times a day (QID) | INTRAMUSCULAR | Status: DC | PRN

## 2017-07-28 MED ORDER — IPRATROPIUM-ALBUTEROL 0.5-2.5 (3) MG/3ML IN SOLN: 3.0000 mL | RESPIRATORY_TRACT | Status: DC | PRN

## 2017-07-28 MED ORDER — GENTAMICIN SULFATE 40 MG/ML IJ SOLN
80.0000 mg | INTRAMUSCULAR | Status: AC
  Administered 2017-07-28: 80 mg
  Filled 2017-07-28: qty 2

## 2017-07-28 MED ORDER — MOMETASONE FURO-FORMOTEROL FUM 200-5 MCG/ACT IN AERO
2.0000 | INHALATION_SPRAY | Freq: Two times a day (BID) | RESPIRATORY_TRACT | Status: DC
  Administered 2017-07-29: 2 via RESPIRATORY_TRACT
  Filled 2017-07-28: qty 8.8

## 2017-07-28 MED ORDER — FENTANYL CITRATE (PF) 100 MCG/2ML IJ SOLN
INTRAMUSCULAR | Status: AC
  Filled 2017-07-28: qty 2

## 2017-07-28 MED ORDER — SODIUM CHLORIDE 0.9 % IV SOLN: INTRAVENOUS | Status: AC

## 2017-07-28 MED ORDER — DOFETILIDE 500 MCG PO CAPS
500.0000 ug | ORAL_CAPSULE | Freq: Two times a day (BID) | ORAL | Status: DC
  Administered 2017-07-28 – 2017-07-29 (×2): 500 ug via ORAL
  Filled 2017-07-28 (×2): qty 1

## 2017-07-28 MED ORDER — LIDOCAINE HCL (PF) 1 % IJ SOLN
INTRAMUSCULAR | Status: DC | PRN
  Administered 2017-07-28 (×2): 50 mL via INTRADERMAL

## 2017-07-28 MED ORDER — FUROSEMIDE 40 MG PO TABS
40.0000 mg | ORAL_TABLET | Freq: Every day | ORAL | Status: DC
Start: 2017-07-28 — End: 2017-07-29
  Administered 2017-07-28: 14:00:00 40 mg via ORAL
  Filled 2017-07-28 (×2): qty 1

## 2017-07-28 MED ORDER — VANCOMYCIN HCL IN DEXTROSE 1-5 GM/200ML-% IV SOLN
1000.0000 mg | Freq: Two times a day (BID) | INTRAVENOUS | Status: AC
  Administered 2017-07-28: 21:00:00 1000 mg via INTRAVENOUS
  Filled 2017-07-28: qty 200

## 2017-07-28 MED ORDER — WARFARIN - PHYSICIAN DOSING INPATIENT
Freq: Every day | Status: DC
Start: 2017-07-28 — End: 2017-07-29

## 2017-07-28 MED ORDER — MUPIROCIN 2 % EX OINT
TOPICAL_OINTMENT | Freq: Once | CUTANEOUS | Status: AC
  Administered 2017-07-28: 1 via NASAL
  Filled 2017-07-28: qty 22

## 2017-07-28 MED ORDER — MIDAZOLAM HCL 5 MG/5ML IJ SOLN
INTRAMUSCULAR | Status: AC
  Filled 2017-07-28: qty 5

## 2017-07-28 MED ORDER — SODIUM CHLORIDE 0.9 % IV SOLN
INTRAVENOUS | Status: DC
  Administered 2017-07-28: 08:00:00 via INTRAVENOUS

## 2017-07-28 MED ORDER — MEXILETINE HCL 150 MG PO CAPS
150.0000 mg | ORAL_CAPSULE | Freq: Three times a day (TID) | ORAL | Status: DC
  Administered 2017-07-28: 14:00:00 150 mg via ORAL
  Filled 2017-07-28: qty 1

## 2017-07-28 MED ORDER — LIDOCAINE HCL (PF) 1 % IJ SOLN
INTRAMUSCULAR | Status: AC
  Filled 2017-07-28: qty 30

## 2017-07-28 MED ORDER — HEPARIN (PORCINE) IN NACL 2-0.9 UNIT/ML-% IJ SOLN
INTRAMUSCULAR | Status: AC | PRN
  Administered 2017-07-28: 500 mL

## 2017-07-28 MED ORDER — NITROGLYCERIN 0.4 MG SL SUBL: 0.4000 mg | SUBLINGUAL_TABLET | SUBLINGUAL | Status: DC | PRN

## 2017-07-28 MED ORDER — IOPAMIDOL (ISOVUE-370) INJECTION 76%
INTRAVENOUS | Status: DC | PRN
Start: 2017-07-28 — End: 2017-07-28
  Administered 2017-07-28: 3 mL via INTRAVENOUS
  Administered 2017-07-28: 15 mL via INTRAVENOUS

## 2017-07-28 MED ORDER — NAPROXEN SODIUM 275 MG PO TABS
275.0000 mg | ORAL_TABLET | Freq: Two times a day (BID) | ORAL | Status: DC | PRN
Start: 2017-07-28 — End: 2017-07-29
  Filled 2017-07-28: qty 1

## 2017-07-28 MED ORDER — LEVOTHYROXINE SODIUM 88 MCG PO TABS
88.0000 ug | ORAL_TABLET | Freq: Every day | ORAL | Status: DC
  Administered 2017-07-29: 88 ug via ORAL
  Filled 2017-07-28: qty 1

## 2017-07-28 MED ORDER — VANCOMYCIN HCL IN DEXTROSE 1-5 GM/200ML-% IV SOLN
1000.0000 mg | INTRAVENOUS | Status: AC
  Administered 2017-07-28: 1000 mg via INTRAVENOUS
  Filled 2017-07-28: qty 200

## 2017-07-28 MED ORDER — CARVEDILOL 12.5 MG PO TABS
12.5000 mg | ORAL_TABLET | Freq: Two times a day (BID) | ORAL | Status: DC
  Administered 2017-07-28 – 2017-07-29 (×3): 12.5 mg via ORAL
  Filled 2017-07-28 (×3): qty 1

## 2017-07-28 MED ORDER — YOU HAVE A PACEMAKER BOOK
Freq: Once | Status: AC
  Administered 2017-07-28: 14:00:00
  Filled 2017-07-28: qty 1

## 2017-07-28 MED ORDER — MEXILETINE HCL 200 MG PO CAPS: 200.0000 mg | ORAL_CAPSULE | Freq: Three times a day (TID) | ORAL | Status: DC

## 2017-07-28 MED ORDER — VANCOMYCIN HCL IN DEXTROSE 1-5 GM/200ML-% IV SOLN
INTRAVENOUS | Status: AC
  Filled 2017-07-28: qty 200

## 2017-07-28 MED ORDER — CHLORHEXIDINE GLUCONATE 4 % EX LIQD
60.0000 mL | Freq: Once | CUTANEOUS | Status: DC
  Filled 2017-07-28: qty 60

## 2017-07-28 MED ORDER — SODIUM CHLORIDE 0.9 % IR SOLN
Status: AC
  Filled 2017-07-28 (×2): qty 2

## 2017-07-28 MED ORDER — MUPIROCIN 2 % EX OINT
TOPICAL_OINTMENT | CUTANEOUS | Status: AC
  Filled 2017-07-28: qty 22

## 2017-07-28 MED ORDER — MEXILETINE HCL 150 MG PO CAPS
150.0000 mg | ORAL_CAPSULE | Freq: Two times a day (BID) | ORAL | Status: DC
  Administered 2017-07-28 – 2017-07-29 (×2): 150 mg via ORAL
  Filled 2017-07-28 (×2): qty 1

## 2017-07-28 MED ORDER — WARFARIN SODIUM 5 MG PO TABS
5.0000 mg | ORAL_TABLET | ORAL | Status: DC
  Administered 2017-07-28: 5 mg via ORAL
  Filled 2017-07-28: qty 1

## 2017-07-28 MED ORDER — MIDAZOLAM HCL 5 MG/5ML IJ SOLN
INTRAMUSCULAR | Status: DC | PRN
  Administered 2017-07-28 (×5): 1 mg via INTRAVENOUS

## 2017-07-28 SURGICAL SUPPLY — 14 items
ADAPTER SEALING SSA-EW-09 (MISCELLANEOUS) ×2 IMPLANT
ADPR INTRO LNG 9FR SL XTD WNG (MISCELLANEOUS) ×1
CABLE SURGICAL S-101-97-12 (CABLE) ×4 IMPLANT
CATH ATTAIN COM SURV 6250V-MB2 (CATHETERS) ×2 IMPLANT
HEMOSTAT SURGICEL 2X4 FIBR (HEMOSTASIS) ×2 IMPLANT
ICD CLARIA MRI DTMA1Q1 (ICD Generator) ×2 IMPLANT
KIT ESSENTIALS PG (KITS) ×2 IMPLANT
LEAD QUARTET 1458Q-86CM (Lead) ×2 IMPLANT
PAD DEFIB LIFELINK (PAD) ×2 IMPLANT
SHEATH CLASSIC 9.5F (SHEATH) ×2 IMPLANT
SLITTER 6232ADJ (MISCELLANEOUS) ×2 IMPLANT
TRAY PACEMAKER INSERTION (PACKS) ×4 IMPLANT
WIRE ACUITY WHISPER EDS 4648 (WIRE) ×2 IMPLANT
WIRE HI TORQ VERSACORE-J 145CM (WIRE) ×2 IMPLANT

## 2017-07-28 NOTE — Discharge Summary (Signed)
ELECTROPHYSIOLOGY PROCEDURE DISCHARGE SUMMARY    Patient ID: Cody Gomez,  MRN: [id number], DOB/AGE: 20-May-1931 83 y.o.  Admit date: 07/28/2017 Discharge date: 07/29/2017  Primary Care Physician: Cyndy Freeze, MD  Primary Cardiologist/Electrophysiologist: Dr. [name]  Primary Discharge Diagnosis:  1. CM (mixed, ischemic/nonischemic)  Secondary Discharge Diagnosis:  1. ASthma 2. PAFib     CHA2DS2Vasc is at least 5, on warfarin, monitored and managed with his PMD 3. HTN 4. CAD 5. Hx VT  Allergies  Allergen Reactions  . Dobutamine Other (See Comments)    ASYSTOLE  . Penicillins Swelling    SWELLING AT INJECTION SITE  Has patient had a PCN reaction causing immediate rash, facial/tongue/throat swelling, SOB or lightheadedness with hypotension: No Has patient had a PCN reaction causing severe rash involving mucus membranes or skin necrosis: No Has patient had a PCN reaction that required hospitalization: No Has patient had a PCN reaction occurring within the last 10 years: No If all of the above answers are "NO", then may proceed with Cephalosporin use.      Procedures This Admission:  1.  Implantation/upgrade of a dual chamber ICD to CRT-D on 07/28/17 by Dr [name].  The patient received a [employer] Jude 1458-lead serial number [id number] LV lead (from Right sided approach), and a [employer] C LARIA serial number [id number] H ICD. DFT's were deferred at time of implant There were no immediate post procedure complications. 2.  CXR on 07/29/17 demonstrated no pneumothorax status post device implantation.   Brief HPI: Cody Gomez is a 83 y.o. male is followed out patient with ongoing CHF issues, worsening CM, decided to pursue upgrade of his ICD to a CRT-D.  Past medical history is as above.  Risks, benefits, and alternatives to ICD implantation were reviewed with the patient who wished to proceed.   Hospital Course:  The patient was admitted and underwent implantation of a LV  lead and upgrade of his dual chamber ICD to CRT-D with details as outlined above. He was monitored on telemetry overnight which demonstrated SR w/V pacing.  Left and right chest wounds are without hematoma or ecchymosis.  The device was interrogated and found to be functioning normally.  CXR was obtained and demonstrated no pneumothorax status post device implantation.  The patient feels well, no CP, reports SOB at his baseline with his asthma/CM, he reports "always with a slight wheeze". Wound care, arm mobility, and restrictions were reviewed with the patient.  The patient was examined by Dr. [name] and considered stable for discharge to home. The patient will call his PCP office to arrange INR check next week., his TSH was elevated, his synthroid increased to 154mcg daily, with instructions to f/u with his PMD about this as well.  The patient's discharge medications include an ARB (Entresto) and beta blocker (carvedilol).   Physical Exam: Vitals:   07/29/17 0743 07/29/17 0851 07/29/17 0921 07/29/17 1117  BP: (!) 148/78 139/68  136/67  Pulse: 71 76  70  Resp:    17  Temp: 97.9 F (36.6 C)   97.8 F (36.6 C)  TempSrc: Oral   Oral  SpO2: 96% 98% 96% 99%  Weight:      Height:        GEN- The patient is well appearing, alert and oriented x 3 today.   HEENT: normocephalic, atraumatic; sclera clear, conjunctiva pink; hearing intact; oropharynx clear Lungs- CTA b/l, normal work of breathing.  Very slight insp wheeze left, no rales, rhonchi Heart- RRR, 1/6  SM, no rubs or gallops GI- soft, non-tender, non-distended Extremities- no clubbing, cyanosis, or edema MS- no significant deformity, age appropriate atrophy Skin- warm and dry, no rash or lesion, left and right chest sites are without hematoma/ecchymosis Psych- euthymic mood, full affect Neuro- no gross defecits  Labs:   Lab Results  Component Value Date   WBC 7.3 07/22/2017   HGB 14.1 07/22/2017   HCT 42.4 07/22/2017   MCV 91  07/22/2017   PLT 217 07/22/2017   No results for input(s): NA, K, CL, CO2, BUN, CREATININE, CALCIUM, PROT, BILITOT, ALKPHOS, ALT, AST, GLUCOSE in the last 168 hours.  Invalid input(s): LABALBU  Discharge Medications:  Allergies as of 07/29/2017      Reactions   Dobutamine Other (See Comments)   ASYSTOLE   Penicillins Swelling   SWELLING AT INJECTION SITE Has patient had a PCN reaction causing immediate rash, facial/tongue/throat swelling, SOB or lightheadedness with hypotension: No Has patient had a PCN reaction causing severe rash involving mucus membranes or skin necrosis: No Has patient had a PCN reaction that required hospitalization: No Has patient had a PCN reaction occurring within the last 10 years: No If all of the above answers are "NO", then may proceed with Cephalosporin use.      Medication List    TAKE these medications   ALEVE 220 MG tablet Generic drug:  naproxen sodium Take 440 mg by mouth 2 (two) times daily as needed (pain or headache).   calcium carbonate 500 MG chewable tablet Commonly known as:  TUMS Chew 2 tablets (400 mg of elemental calcium total) by mouth 2 (two) times daily.   carvedilol 12.5 MG tablet Commonly known as:  COREG Take 1 tablet (12.5 mg total) by mouth 2 (two) times daily.   dofetilide 500 MCG capsule Commonly known as:  TIKOSYN TAKE 1 CAPSULE BY MOUTH TWICE DAILY   furosemide 40 MG tablet Commonly known as:  LASIX Take 40 mg by mouth daily.   guaiFENesin 600 MG 12 hr tablet Commonly known as:  MUCINEX Take 1,200 mg by mouth daily as needed (congestion).   HYDROcodone-acetaminophen 5-325 MG tablet Commonly known as:  NORCO/VICODIN Take 1-2 tablets by mouth every 4 (four) hours as needed for moderate pain.   ipratropium-albuterol 0.5-2.5 (3) MG/3ML Soln Commonly known as:  DUONEB Inhale 3 mLs into the lungs every 4 (four) hours as needed for shortness of breath or wheezing.   levothyroxine 100 MCG tablet Commonly known as:   SYNTHROID Take 1 tablet (100 mcg total) by mouth daily. What changed:  medication strength  how much to take  when to take this   mexiletine 150 MG capsule Commonly known as:  MEXITIL TAKE 1 CAPSULE(150 MG) BY MOUTH TWICE DAILY   NITROSTAT 0.4 MG SL tablet Generic drug:  nitroGLYCERIN Place 1 tablet under the tongue every 5 (five) minutes as needed for chest pain.   potassium chloride SA 20 MEQ tablet Commonly known as:  K-DUR,KLOR-CON Take 2 tablets (40 meq) by mouth once daily on Mondays, Wednesdays, and Fridays, and 1 tablet (20 meq) once daily all other days of the week   sacubitril-valsartan 24-26 MG Commonly known as:  ENTRESTO Take 1 tablet by mouth 2 (two) times daily.   SYMBICORT 160-4.5 MCG/ACT inhaler Generic drug:  budesonide-formoterol Inhale 1 puff into the lungs 2 (two) times daily as needed (shortness of breath).   warfarin 5 MG tablet Commonly known as:  COUMADIN Take 2.5-5 mg by mouth at bedtime. Takes 2.5  mg (1/2 tab) on Monday, Wednesday, Friday, and Sunday. Takes 5 mg on Tuesday, Thursday, and Saturday       Disposition: Home Discharge Instructions    Diet - low sodium heart healthy    Complete by:  As directed    Increase activity slowly    Complete by:  As directed      Follow-up Information    [facility] Follow up on 08/08/2017.   Specialty:  Cardiology Why:  11:00AM, wound check visit Contact information: [address] [place]       Deboraha Sprang, MD Follow up on 10/31/2017.   Specialty:  Cardiology Why:  11:15AM Contact information: [address] [place] [address] [phone]        Cyndy Freeze, MD Follow up in 1 week(s).   Specialty:  Family Medicine Why:  Please call to make appointment for warfarin/coumain lab/check for next week and follow up on thyroid management Contact information: [place]  [address] [phone]           Duration of Discharge Encounter: Greater than 30 minutes including physician time.  Venetia Night, PA-C 07/29/2017 12:22 PM  Seen and reivewed  Instructions given  Home today

## 2017-07-28 NOTE — Interval H&P Note (Signed)
History and Physical Interval Note:  07/28/2017 12:22 PM  Cody Gomez  has presented today for surgery, with the diagnosis of cm - vt CHF  The various methods of treatment have been discussed with the patient and family. After consideration of risks, benefits and other options for treatment, the patient has consented to  Procedure(s): BiVI Upgrade (N/A) as a surgical intervention .  The patient's history has been reviewed, patient examined, no change in status, stable for surgery.  I have reviewed the patient's chart and labs.  Questions were answered to the patient's satisfaction.     Virl Axe  Not on entresto 2/2 diozziness

## 2017-07-28 NOTE — Progress Notes (Signed)
Placed pt on cpap with a pressure of 10(states that is what his is at home). Pt tolerating comfortably.

## 2017-07-28 NOTE — Interval H&P Note (Signed)
ICD Criteria  Current LVEF:20%. Within 12 months prior to implant: Yes   Heart failure history: Yes, Class III  Cardiomyopathy history: Yes, Ischemic Cardiomyopathy - Prior MI.  Atrial Fibrillation/Atrial Flutter: Yes, Persistent (> 7 Days).  Ventricular tachycardia history: Yes, Hemodynamic instability present. VT Type: Sustained Ventricular Tachycardia - Monomorphic and Polymorphic.  Cardiac arrest history: No.  History of syndromes with risk of sudden death: No.  Previous ICD: Yes, Reason for ICD:  Primary prevention.  Current ICD indication: Secondary  PPM indication: Yes. Pacing type: Ventricular. Greater than 40% RV pacing requirement anticipated. Indication: Complete Heart Block   Class I or II Bradycardia indication present: Yes  Beta Blocker therapy for 3 or more months: Yes, prescribed.   Ace Inhibitor/ARB therapy for 3 or more months: Yes, prescribed.   History and Physical Interval Note:  07/28/2017 12:20 PM  Cody Gomez  has presented today for surgery, with the diagnosis of cm - vt CHF  The various methods of treatment have been discussed with the patient and family. After consideration of risks, benefits and other options for treatment, the patient has consented to  Procedure(s): BiVI Upgrade (N/A) as a surgical intervention .  The patient's history has been reviewed, patient examined, no change in status, stable for surgery.  I have reviewed the patient's chart and labs.  Questions were answered to the patient's satisfaction.     Virl Axe

## 2017-07-28 NOTE — Progress Notes (Signed)
Called to bedside by RN. Pt stating that pressure is too much. Turned down pressure to 8. Pt states that it feels better.

## 2017-07-28 NOTE — Discharge Instructions (Addendum)
Supplemental Discharge Instructions for  Pacemaker/Defibrillator Patients  Activity No heavy lifting or vigorous activity with your right arm for 6 to 8 weeks.  Do not raise your right arm above your head for one week.  Gradually raise your affected arm as drawn below.             08/01/17                     08/02/17                    08/03/17                   08/04/17 __  NO DRIVING for  1 week   ; you may begin driving on   6/56/81  .  WOUND CARE - Keep the wound area clean and dry.  Do not get this area wet, no showers for 24 hours; you may shower on  07/29/17 evening  . - The tape/steri-strips on your wound will fall off; do not pull them off.  No bandage is needed on the site.  DO  NOT apply any creams, oils, or ointments to the wound area. - If you notice any drainage or discharge from the wound, any swelling or bruising at the site, or you develop a fever > 101? F after you are discharged home, call the office at once.  Special Instructions - You are still able to use cellular telephones; use the ear opposite the side where you have your pacemaker/defibrillator.  Avoid carrying your cellular phone near your device. - When traveling through airports, show security personnel your identification card to avoid being screened in the metal detectors.  Ask the security personnel to use the hand wand. - Avoid arc welding equipment, MRI testing (magnetic resonance imaging), TENS units (transcutaneous nerve stimulators).  Call the office for questions about other devices. - Avoid electrical appliances that are in poor condition or are not properly grounded. - Microwave ovens are safe to be near or to operate.  Additional information for defibrillator patients should your device go off: - If your device goes off ONCE and you feel fine afterward, notify the device clinic nurses. - If your device goes off ONCE and you do not feel well afterward, call 911. - If your device goes off TWICE,  call 911. - If your device goes off THREE times in one day, call 911.  DO NOT DRIVE YOURSELF OR A FAMILY MEMBER WITH A DEFIBRILLATOR TO THE HOSPITAL--CALL 911.   Information on my medicine - Coumadin   (Warfarin)  This medication education was reviewed with me or my healthcare representative as part of my discharge preparation.  The pharmacist that spoke with me during my hospital stay was:  Saundra Shelling, [facility]  Why was Coumadin prescribed for you? Coumadin was prescribed for you because you have a blood clot or a medical condition that can cause an increased risk of forming blood clots. Blood clots can cause serious health problems by blocking the flow of blood to the heart, lung, or brain. Coumadin can prevent harmful blood clots from forming. As a reminder your indication for Coumadin is:   Stroke Prevention Because Of Atrial Fibrillation  What test will check on my response to Coumadin? While on Coumadin (warfarin) you will need to have an INR test regularly to ensure that your dose is keeping you in the desired range. The INR (international normalized ratio)  number is calculated from the result of the laboratory test called prothrombin time (PT).  If an INR APPOINTMENT HAS NOT ALREADY BEEN MADE FOR YOU please schedule an appointment to have this lab work done by your health care provider within 7 days. Your INR goal is usually a number between:  2 to 3 or your provider may give you a more narrow range like 2-2.5.  Ask your health care provider during an office visit what your goal INR is.  What  do you need to  know  About  COUMADIN? Take Coumadin (warfarin) exactly as prescribed by your healthcare provider about the same time each day.  DO NOT stop taking without talking to the doctor who prescribed the medication.  Stopping without other blood clot prevention medication to take the place of Coumadin may increase your risk of developing a new clot or stroke.  Get refills before you run  out.  What do you do if you miss a dose? If you miss a dose, take it as soon as you remember on the same day then continue your regularly scheduled regimen the next day.  Do not take two doses of Coumadin at the same time.  Important Safety Information A possible side effect of Coumadin (Warfarin) is an increased risk of bleeding. You should call your healthcare provider right away if you experience any of the following: ? Bleeding from an injury or your nose that does not stop. ? Unusual colored urine (red or dark brown) or unusual colored stools (red or black). ? Unusual bruising for unknown reasons. ? A serious fall or if you hit your head (even if there is no bleeding).  Some foods or medicines interact with Coumadin (warfarin) and might alter your response to warfarin. To help avoid this: ? Eat a balanced diet, maintaining a consistent amount of Vitamin K. ? Notify your provider about major diet changes you plan to make. ? Avoid alcohol or limit your intake to 1 drink for women and 2 drinks for men per day. (1 drink is 5 oz. wine, 12 oz. beer, or 1.5 oz. liquor.)  Make sure that ANY health care provider who prescribes medication for you knows that you are taking Coumadin (warfarin).  Also make sure the healthcare provider who is monitoring your Coumadin knows when you have started a new medication including herbals and non-prescription products.  Coumadin (Warfarin)  Major Drug Interactions  Increased Warfarin Effect Decreased Warfarin Effect  Alcohol (large quantities) Antibiotics (esp. Septra/Bactrim, Flagyl, Cipro) Amiodarone (Cordarone) Aspirin (ASA) Cimetidine (Tagamet) Megestrol (Megace) NSAIDs (ibuprofen, naproxen, etc.) Piroxicam (Feldene) Propafenone (Rythmol SR) Propranolol (Inderal) Isoniazid (INH) Posaconazole (Noxafil) Barbiturates (Phenobarbital) Carbamazepine (Tegretol) Chlordiazepoxide (Librium) Cholestyramine (Questran) Griseofulvin Oral  Contraceptives Rifampin Sucralfate (Carafate) Vitamin K   Coumadin (Warfarin) Major Herbal Interactions  Increased Warfarin Effect Decreased Warfarin Effect  Garlic Ginseng Ginkgo biloba Coenzyme Q10 Green tea St. Johns wort    Coumadin (Warfarin) FOOD Interactions  Eat a consistent number of servings per week of foods HIGH in Vitamin K (1 serving =  cup)  Collards (cooked, or boiled & drained) Kale (cooked, or boiled & drained) Mustard greens (cooked, or boiled & drained) Parsley *serving size only =  cup Spinach (cooked, or boiled & drained) Swiss chard (cooked, or boiled & drained) Turnip greens (cooked, or boiled & drained)  Eat a consistent number of servings per week of foods MEDIUM-HIGH in Vitamin K (1 serving = 1 cup)  Asparagus (cooked, or boiled & drained) Broccoli (cooked,  boiled & drained, or raw & chopped) Brussel sprouts (cooked, or boiled & drained) *serving size only =  cup Lettuce, raw (green leaf, endive, romaine) Spinach, raw Turnip greens, raw & chopped   These websites have more information on Coumadin (warfarin):  FailFactory.se; VeganReport.com.au;
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
## 2017-07-28 NOTE — Care Management Note (Signed)
Case Management Note  Patient Details  Name: ANTIONNE ENRIQUE MRN: [id number] Date of Birth: [birth year]  Subjective/Objective:  From home, s/p BiVI Upgrade.                   Action/Plan:  NCM wll follow for dc needs.   Expected Discharge Date:                  Expected Discharge Plan:  Home/Self Care  In-House Referral:     Discharge planning Services  CM Consult  Post Acute Care Choice:    Choice offered to:     DME Arranged:    DME Agency:     HH Arranged:    HH Agency:     Status of Service:  In process, will continue to follow  If discussed at Long Length of Stay Meetings, dates discussed:    Additional Comments:  Zenon Mayo, RN 07/28/2017, 2:58 PM

## 2017-07-29 ENCOUNTER — Ambulatory Visit (HOSPITAL_COMMUNITY): Payer: Medicare Other

## 2017-07-29 ENCOUNTER — Other Ambulatory Visit: Payer: Self-pay | Admitting: Physician Assistant

## 2017-07-29 DIAGNOSIS — Z87891 Personal history of nicotine dependence: Secondary | ICD-10-CM | POA: Diagnosis not present

## 2017-07-29 DIAGNOSIS — I255 Ischemic cardiomyopathy: Secondary | ICD-10-CM | POA: Diagnosis not present

## 2017-07-29 DIAGNOSIS — Z880 Allergy status to penicillin: Secondary | ICD-10-CM | POA: Diagnosis not present

## 2017-07-29 DIAGNOSIS — I429 Cardiomyopathy, unspecified: Secondary | ICD-10-CM | POA: Diagnosis not present

## 2017-07-29 DIAGNOSIS — Z95810 Presence of automatic (implantable) cardiac defibrillator: Secondary | ICD-10-CM | POA: Diagnosis not present

## 2017-07-29 DIAGNOSIS — I428 Other cardiomyopathies: Secondary | ICD-10-CM | POA: Diagnosis not present

## 2017-07-29 DIAGNOSIS — I4891 Unspecified atrial fibrillation: Secondary | ICD-10-CM | POA: Diagnosis not present

## 2017-07-29 DIAGNOSIS — Z8546 Personal history of malignant neoplasm of prostate: Secondary | ICD-10-CM | POA: Diagnosis not present

## 2017-07-29 DIAGNOSIS — E785 Hyperlipidemia, unspecified: Secondary | ICD-10-CM | POA: Diagnosis not present

## 2017-07-29 DIAGNOSIS — G4733 Obstructive sleep apnea (adult) (pediatric): Secondary | ICD-10-CM | POA: Diagnosis not present

## 2017-07-29 DIAGNOSIS — Z7951 Long term (current) use of inhaled steroids: Secondary | ICD-10-CM | POA: Diagnosis not present

## 2017-07-29 DIAGNOSIS — I472 Ventricular tachycardia: Secondary | ICD-10-CM | POA: Diagnosis not present

## 2017-07-29 DIAGNOSIS — Z85828 Personal history of other malignant neoplasm of skin: Secondary | ICD-10-CM | POA: Diagnosis not present

## 2017-07-29 DIAGNOSIS — Z7901 Long term (current) use of anticoagulants: Secondary | ICD-10-CM | POA: Diagnosis not present

## 2017-07-29 DIAGNOSIS — I509 Heart failure, unspecified: Secondary | ICD-10-CM | POA: Diagnosis not present

## 2017-07-29 LAB — PROTIME-INR
INR: 1.78
PROTHROMBIN TIME: 20.9 s — AB (ref 11.4–15.2)

## 2017-07-29 MED ORDER — LEVOTHYROXINE SODIUM 100 MCG PO TABS: 100.0000 ug | ORAL_TABLET | Freq: Every day | ORAL | 0 refills | Status: DC

## 2017-07-29 NOTE — Telephone Encounter (Signed)
Renee filled for #30 day supply today- but all future refills will need to come from his PCP- please forward request to them for #90 day supply.   Thanks!

## 2017-08-01 ENCOUNTER — Telehealth: Payer: Self-pay

## 2017-08-01 NOTE — Telephone Encounter (Signed)
Returned patient as requested by voice mail message.  He stated he has a new monitor for his new defibrillator and wanted to know if should send remote transmission.  Advised he can send the transmission by following commands on the screen. He said he will try to send one later today.

## 2017-08-08 ENCOUNTER — Ambulatory Visit: Payer: Medicare Other

## 2017-08-08 ENCOUNTER — Telehealth: Payer: Self-pay

## 2017-08-08 NOTE — Telephone Encounter (Signed)
Appointment was rescheduled to 08/10/17 at 2:00pm per notes.

## 2017-08-08 NOTE — Telephone Encounter (Signed)
Patient left voice mail message requesting to cancel his 11:00 am wound check today because he is really sick.  He said he would need to reschedule.  Message sent to device triage.

## 2017-08-10 ENCOUNTER — Ambulatory Visit (INDEPENDENT_AMBULATORY_CARE_PROVIDER_SITE_OTHER): Payer: Medicare Other | Admitting: *Deleted

## 2017-08-10 DIAGNOSIS — I428 Other cardiomyopathies: Secondary | ICD-10-CM | POA: Diagnosis not present

## 2017-08-10 DIAGNOSIS — Z95810 Presence of automatic (implantable) cardiac defibrillator: Secondary | ICD-10-CM

## 2017-08-10 DIAGNOSIS — I5022 Chronic systolic (congestive) heart failure: Secondary | ICD-10-CM | POA: Diagnosis not present

## 2017-08-10 LAB — CUP PACEART INCLINIC DEVICE CHECK
Battery Remaining Longevity: 68 mo
Brady Statistic RA Percent Paced: 86.58 %
Brady Statistic RV Percent Paced: 99.38 %
HIGH POWER IMPEDANCE MEASURED VALUE: 59 Ohm
HighPow Impedance: 80 Ohm
Implantable Lead Implant Date: 20080805
Implantable Lead Implant Date: 20080805
Implantable Lead Location: 753858
Implantable Lead Location: 753860
Implantable Lead Model: 7001
Implantable Pulse Generator Implant Date: 20180816
Lead Channel Impedance Value: 237.5 Ohm
Lead Channel Impedance Value: 246.635
Lead Channel Impedance Value: 246.635
Lead Channel Impedance Value: 246.635
Lead Channel Impedance Value: 456 Ohm
Lead Channel Impedance Value: 475 Ohm
Lead Channel Impedance Value: 722 Ohm
Lead Channel Impedance Value: 817 Ohm
Lead Channel Impedance Value: 874 Ohm
Lead Channel Impedance Value: 893 Ohm
Lead Channel Pacing Threshold Amplitude: 0.5 V
Lead Channel Pacing Threshold Amplitude: 1.25 V
Lead Channel Pacing Threshold Pulse Width: 0.4 ms
Lead Channel Sensing Intrinsic Amplitude: 3.375 mV
Lead Channel Setting Pacing Amplitude: 2.75 V
Lead Channel Setting Sensing Sensitivity: 1.2 mV
MDC IDC LEAD IMPLANT DT: 20180816
MDC IDC LEAD LOCATION: 753859
MDC IDC MSMT BATTERY VOLTAGE: 3.12 V
MDC IDC MSMT LEADCHNL LV IMPEDANCE VALUE: 237.5 Ohm
MDC IDC MSMT LEADCHNL LV IMPEDANCE VALUE: 475 Ohm
MDC IDC MSMT LEADCHNL LV IMPEDANCE VALUE: 475 Ohm
MDC IDC MSMT LEADCHNL LV IMPEDANCE VALUE: 513 Ohm
MDC IDC MSMT LEADCHNL LV IMPEDANCE VALUE: 817 Ohm
MDC IDC MSMT LEADCHNL LV IMPEDANCE VALUE: 836 Ohm
MDC IDC MSMT LEADCHNL LV PACING THRESHOLD AMPLITUDE: 2 V
MDC IDC MSMT LEADCHNL LV PACING THRESHOLD AMPLITUDE: 3 V
MDC IDC MSMT LEADCHNL LV PACING THRESHOLD PULSEWIDTH: 0.4 ms
MDC IDC MSMT LEADCHNL LV PACING THRESHOLD PULSEWIDTH: 0.8 ms
MDC IDC MSMT LEADCHNL RV IMPEDANCE VALUE: 361 Ohm
MDC IDC MSMT LEADCHNL RV IMPEDANCE VALUE: 551 Ohm
MDC IDC MSMT LEADCHNL RV PACING THRESHOLD PULSEWIDTH: 0.4 ms
MDC IDC SESS DTM: 20180829151357
MDC IDC SET LEADCHNL LV PACING AMPLITUDE: 3.5 V
MDC IDC SET LEADCHNL LV PACING PULSEWIDTH: 0.8 ms
MDC IDC SET LEADCHNL RA PACING AMPLITUDE: 2 V
MDC IDC SET LEADCHNL RV PACING PULSEWIDTH: 0.4 ms
MDC IDC STAT BRADY AP VP PERCENT: 86.57 %
MDC IDC STAT BRADY AP VS PERCENT: 0.29 %
MDC IDC STAT BRADY AS VP PERCENT: 13.05 %
MDC IDC STAT BRADY AS VS PERCENT: 0.09 %

## 2017-08-10 NOTE — Progress Notes (Signed)
Wound check appointment. Dermabond removed from R and L chest incisions. Wounds without redness, slight edema noted over L chest site, soft to palpation. Yellowish, healing ecchymosis noted across L chest, improved per patient. Incision edges approximated, wound well healed. Patient and son verbalize understanding of instructions to monitor for any signs/symptoms of infection or bleeding. Normal device function. LV threshold elevated at 3.25V @ 0.60ms (1.5V @ 0.90ms at implant), threshold is 2.0V @ 0.52ms. Thresholds, sensing, and impedances otherwise consistent with implant measurements. Device programmed on monitor at 3.5V @ 0.70ms (LV only) for extra safety margin until 3 month visit, RA/RV outputs at chronic settings. Histogram distribution appropriate for patient and level of activity. No mode switches or ventricular arrhythmias noted, +warfarin and mexilitine. Patient educated about wound care, arm mobility, lifting restrictions, shock plan, and Carelink monitor. ROV with SK on 10/31/17.

## 2017-08-11 ENCOUNTER — Telehealth: Payer: Self-pay | Admitting: *Deleted

## 2017-08-11 NOTE — Telephone Encounter (Addendum)
Spoke with patient, advised that his Carelink monitor info was successfully updated in the system and his monitor should work automatically.  Patient is appreciative and denies additional questions or concerns at this time.

## 2017-08-16 ENCOUNTER — Other Ambulatory Visit: Payer: Self-pay | Admitting: Internal Medicine

## 2017-08-16 DIAGNOSIS — E890 Postprocedural hypothyroidism: Secondary | ICD-10-CM | POA: Diagnosis not present

## 2017-08-16 DIAGNOSIS — I420 Dilated cardiomyopathy: Secondary | ICD-10-CM | POA: Diagnosis not present

## 2017-08-16 DIAGNOSIS — Z7901 Long term (current) use of anticoagulants: Secondary | ICD-10-CM | POA: Diagnosis not present

## 2017-08-16 DIAGNOSIS — G4733 Obstructive sleep apnea (adult) (pediatric): Secondary | ICD-10-CM | POA: Diagnosis not present

## 2017-08-16 DIAGNOSIS — I428 Other cardiomyopathies: Secondary | ICD-10-CM

## 2017-08-16 DIAGNOSIS — Z9989 Dependence on other enabling machines and devices: Secondary | ICD-10-CM | POA: Diagnosis not present

## 2017-08-25 DIAGNOSIS — R0600 Dyspnea, unspecified: Secondary | ICD-10-CM | POA: Diagnosis not present

## 2017-08-25 DIAGNOSIS — R0602 Shortness of breath: Secondary | ICD-10-CM | POA: Diagnosis not present

## 2017-09-15 DIAGNOSIS — J449 Chronic obstructive pulmonary disease, unspecified: Secondary | ICD-10-CM | POA: Diagnosis not present

## 2017-09-15 DIAGNOSIS — R0602 Shortness of breath: Secondary | ICD-10-CM | POA: Diagnosis not present

## 2017-09-16 DIAGNOSIS — Z7901 Long term (current) use of anticoagulants: Secondary | ICD-10-CM | POA: Diagnosis not present

## 2017-09-22 ENCOUNTER — Ambulatory Visit (INDEPENDENT_AMBULATORY_CARE_PROVIDER_SITE_OTHER): Payer: Medicare Other

## 2017-09-22 DIAGNOSIS — I5022 Chronic systolic (congestive) heart failure: Secondary | ICD-10-CM

## 2017-09-22 DIAGNOSIS — Z95810 Presence of automatic (implantable) cardiac defibrillator: Secondary | ICD-10-CM

## 2017-09-22 NOTE — Progress Notes (Signed)
EPIC Encounter for ICM Monitoring  Patient Name: Cody Gomez is a 83 y.o. male Date: 09/22/2017 Primary Care Physican: Cyndy Freeze, MD Primary [place] Electrophysiologist: [name] Dry Weight:202 lbs  Since 10-Aug-2017 VT-NS (>4 beats, >128 bpm) 4       Heart Failure questions reviewed, pt asymptomatic.   Thoracic impedance normal.  Prescribed dosage: Furosemide 40 mg every day. Potassium 20 mEq Take 2 tablets (40 meq) by mouth once daily on Mondays, Wednesdays, and Fridays, and 1 tablet (20 meq) once daily all other days of the week.  Labs: 07/22/2017 Creatinine 1.00, BUN 21, Potassium 4.1, Sodium 141, EGFR 69-80 05/27/2017 Creatinine 0.92, BUN 13, Potassium 3.7, Sodium 136, EGFR >60  05/26/2017 Creatinine 0.86, BUN 18, Potassium 3.7, Sodium 137, EGFR >60  04/19/2017 Creatinine 0.92, BUN 21, Potassium 4.6, Sodium 141, EGFR 77-89  02/23/2017 Creatinine 0.91, BUN 15, Potassium 4.0, Sodium 140, EGFR >60 02/18/2017 Creatinine 1.00, BUN 17, Potassium 4.5, Sodium 138, EGFR 69-80  01/20/2017 Creatinine 0.94, BUN 16, Potassium 3.8, Sodium 138, EGFR 75-86  01/19/2017 Creatinine 0.87, BUN 18, Potassium 4.7, Sodium 140   Recommendations: No changes.   Encouraged to call for fluid symptoms.  Follow-up plan: ICM clinic phone appointment on 10/24/2017.  Office appointment scheduled 10/31/2017 with Dr. [name].  Copy of ICM check sent to Dr. [name].   3 month ICM trend: 09/22/2017   1 Year ICM trend:      Rosalene Billings, RN 09/22/2017 2:01 PM

## 2017-09-29 DIAGNOSIS — Z6831 Body mass index (BMI) 31.0-31.9, adult: Secondary | ICD-10-CM | POA: Diagnosis not present

## 2017-09-29 DIAGNOSIS — J189 Pneumonia, unspecified organism: Secondary | ICD-10-CM | POA: Diagnosis not present

## 2017-10-04 DIAGNOSIS — J45909 Unspecified asthma, uncomplicated: Secondary | ICD-10-CM | POA: Diagnosis not present

## 2017-10-17 DIAGNOSIS — Z23 Encounter for immunization: Secondary | ICD-10-CM | POA: Diagnosis not present

## 2017-10-24 ENCOUNTER — Ambulatory Visit (INDEPENDENT_AMBULATORY_CARE_PROVIDER_SITE_OTHER): Payer: Medicare Other

## 2017-10-24 DIAGNOSIS — Z95810 Presence of automatic (implantable) cardiac defibrillator: Secondary | ICD-10-CM | POA: Diagnosis not present

## 2017-10-24 DIAGNOSIS — I5022 Chronic systolic (congestive) heart failure: Secondary | ICD-10-CM | POA: Diagnosis not present

## 2017-10-25 ENCOUNTER — Telehealth: Payer: Self-pay

## 2017-10-25 NOTE — Telephone Encounter (Signed)
Remote ICM transmission received.  Attempted call to patient and left message to return call. 

## 2017-10-25 NOTE — Progress Notes (Signed)
EPIC Encounter for ICM Monitoring  Patient Name: Cody Gomez is a 83 y.o. male Date: 10/25/2017 Primary Care Physican: Cyndy Freeze, MD Primary [place] Electrophysiologist: [name] Dry Weight: Previous weight 202 lbs       Attempted call to patient and unable to reach.  Left message to return call.  Transmission reviewed.    Thoracic impedance abnormal suggesting fluid accumulation since 10/21/2017.  Prescribed dosage: Furosemide 40 mg every day. Potassium 20 mEq Take 2 tablets (40 meq) by mouth once daily on Mondays, Wednesdays, and Fridays, and 1 tablet (20 meq) once daily all other days of the week.  Labs: 07/22/2017 Creatinine 1.00, BUN 21, Potassium 4.1, Sodium 141, EGFR 69-80 05/27/2017 Creatinine 0.92, BUN 13, Potassium 3.7, Sodium 136, EGFR >60  05/26/2017 Creatinine 0.86, BUN 18, Potassium 3.7, Sodium 137, EGFR >60  04/19/2017 Creatinine 0.92, BUN 21, Potassium 4.6, Sodium 141, EGFR 77-89  02/23/2017 Creatinine 0.91, BUN 15, Potassium 4.0, Sodium 140, EGFR >60 02/18/2017 Creatinine 1.00, BUN 17, Potassium 4.5, Sodium 138, EGFR 69-80  01/20/2017 Creatinine 0.94, BUN 16, Potassium 3.8, Sodium 138, EGFR 75-86  01/19/2017 Creatinine 0.87, BUN 18, Potassium 4.7, Sodium 140   Recommendations:NONE - Unable to reach.  Follow-up plan: ICM clinic phone appointment on 12/01/2017 since patient has defib office appointment scheduled 10/31/2017 with Dr. [name].  Copy of ICM check sent to Dr. [name].   3 month ICM trend: 10/24/2017    1 Year ICM trend:       Rosalene Billings, RN 10/25/2017 2:05 PM

## 2017-10-28 NOTE — Progress Notes (Addendum)
Returned patient call as requested by voice mail message.  He said he is feeling fine.  He has been eating salty foods such as saltine crackers.  Advised to limit salt intake to 2000 mg daily.  He has an appointment with Dr [name] 10/31/2017 and explained Dr [name] will make any recommendations if needed.

## 2017-10-31 ENCOUNTER — Ambulatory Visit (INDEPENDENT_AMBULATORY_CARE_PROVIDER_SITE_OTHER): Payer: Medicare Other | Admitting: Internal Medicine

## 2017-10-31 ENCOUNTER — Other Ambulatory Visit: Payer: Self-pay

## 2017-10-31 VITALS — BP 152/74 | HR 56 | Ht 67.0 in | Wt 209.6 lb

## 2017-10-31 DIAGNOSIS — I472 Ventricular tachycardia, unspecified: Secondary | ICD-10-CM

## 2017-10-31 DIAGNOSIS — I5022 Chronic systolic (congestive) heart failure: Secondary | ICD-10-CM

## 2017-10-31 DIAGNOSIS — I428 Other cardiomyopathies: Secondary | ICD-10-CM

## 2017-10-31 DIAGNOSIS — Z95810 Presence of automatic (implantable) cardiac defibrillator: Secondary | ICD-10-CM

## 2017-10-31 DIAGNOSIS — I480 Paroxysmal atrial fibrillation: Secondary | ICD-10-CM | POA: Diagnosis not present

## 2017-10-31 DIAGNOSIS — Z79899 Other long term (current) drug therapy: Secondary | ICD-10-CM

## 2017-10-31 LAB — CUP PACEART INCLINIC DEVICE CHECK
Brady Statistic AP VP Percent: 91.69 %
Brady Statistic AP VS Percent: 0.37 %
Brady Statistic AS VP Percent: 7.86 %
Brady Statistic RA Percent Paced: 91.86 %
Brady Statistic RV Percent Paced: 99.39 %
Date Time Interrogation Session: 20181119142259
HIGH POWER IMPEDANCE MEASURED VALUE: 68 Ohm
HIGH POWER IMPEDANCE MEASURED VALUE: 85 Ohm
Implantable Lead Implant Date: 20080805
Implantable Lead Implant Date: 20180816
Implantable Lead Location: 753859
Implantable Lead Model: 7001
Implantable Pulse Generator Implant Date: 20180816
Lead Channel Impedance Value: 1026 Ohm
Lead Channel Impedance Value: 1083 Ohm
Lead Channel Impedance Value: 1121 Ohm
Lead Channel Impedance Value: 273.729
Lead Channel Impedance Value: 291.742
Lead Channel Impedance Value: 291.742
Lead Channel Impedance Value: 475 Ohm
Lead Channel Impedance Value: 608 Ohm
Lead Channel Impedance Value: 893 Ohm
Lead Channel Impedance Value: 931 Ohm
Lead Channel Pacing Threshold Amplitude: 0.375 V
Lead Channel Pacing Threshold Pulse Width: 0.4 ms
Lead Channel Sensing Intrinsic Amplitude: 9.375 mV
Lead Channel Setting Pacing Amplitude: 3.5 V
Lead Channel Setting Pacing Pulse Width: 0.4 ms
Lead Channel Setting Pacing Pulse Width: 0.8 ms
MDC IDC LEAD IMPLANT DT: 20080805
MDC IDC LEAD LOCATION: 753858
MDC IDC LEAD LOCATION: 753860
MDC IDC MSMT BATTERY REMAINING LONGEVITY: 65 mo
MDC IDC MSMT BATTERY VOLTAGE: 3.02 V
MDC IDC MSMT LEADCHNL LV IMPEDANCE VALUE: 250.943
MDC IDC MSMT LEADCHNL LV IMPEDANCE VALUE: 273.729
MDC IDC MSMT LEADCHNL LV IMPEDANCE VALUE: 475 Ohm
MDC IDC MSMT LEADCHNL LV IMPEDANCE VALUE: 532 Ohm
MDC IDC MSMT LEADCHNL LV IMPEDANCE VALUE: 646 Ohm
MDC IDC MSMT LEADCHNL LV IMPEDANCE VALUE: 646 Ohm
MDC IDC MSMT LEADCHNL LV IMPEDANCE VALUE: 874 Ohm
MDC IDC MSMT LEADCHNL LV PACING THRESHOLD AMPLITUDE: 2.375 V
MDC IDC MSMT LEADCHNL LV PACING THRESHOLD PULSEWIDTH: 0.8 ms
MDC IDC MSMT LEADCHNL RA IMPEDANCE VALUE: 418 Ohm
MDC IDC MSMT LEADCHNL RA PACING THRESHOLD PULSEWIDTH: 0.4 ms
MDC IDC MSMT LEADCHNL RA SENSING INTR AMPL: 3.125 mV
MDC IDC MSMT LEADCHNL RA SENSING INTR AMPL: 3.375 mV
MDC IDC MSMT LEADCHNL RV PACING THRESHOLD AMPLITUDE: 1.75 V
MDC IDC SET LEADCHNL LV PACING AMPLITUDE: 3.5 V
MDC IDC SET LEADCHNL RA PACING AMPLITUDE: 2 V
MDC IDC SET LEADCHNL RV SENSING SENSITIVITY: 1.2 mV
MDC IDC STAT BRADY AS VS PERCENT: 0.08 %
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
## 2017-10-31 NOTE — Patient Instructions (Addendum)
Medication Instructions:  Your physician recommends that you continue on your current medications as directed. Please refer to the Current Medication list given to you today.  * If you need a refill on your cardiac medications before your next appointment, please call your pharmacy. *  Labwork: TODAY:  BMET/TSH/MAGNESIUM   Testing/Procedures: Your physician has requested that you have an echocardiogram in 3 months and needs to be scheduled prior to seeing Amber. Echocardiography is a painless test that uses sound waves to create images of your heart. It provides your doctor with information about the size and shape of your heart and how well your heart's chambers and valves are working. This procedure takes approximately one hour. There are no restrictions for this procedure.    Follow-Up:  Your physician recommends that you schedule a follow-up appointment in: 3 months with Ocie Bob NP.  Remote monitoring is used to monitor your  ICD from home. This monitoring reduces the number of office visits required to check your device to one time per year. It allows [place] to keep an eye on the functioning of your device to ensure it is working properly. You are scheduled for a device check from home on 12/01/17. You may send your transmission at any time that day. If you have a wireless device, the transmission will be sent automatically. After your physician reviews your transmission, you will receive a postcard with your next transmission date.  Your physician wants you to follow-up in: 6 months with Dr. [name].  You will receive a reminder letter in the mail two months in advance. If you don't receive a letter, please call our office to schedule the follow-up appointment.  Thank you for choosing [facility]!!    [phone]  Any Other Special Instructions Will Be Listed Below (If Applicable).

## 2017-11-01 LAB — THYROID PANEL WITH TSH
FREE THYROXINE INDEX: 2.8 (ref 1.2–4.9)
T3 Uptake Ratio: 27 % (ref 24–39)
T4, Total: 10.5 ug/dL (ref 4.5–12.0)
TSH: 2.89 u[IU]/mL (ref 0.450–4.500)

## 2017-11-01 LAB — BASIC METABOLIC PANEL
BUN / CREAT RATIO: 18 (ref 10–24)
BUN: 16 mg/dL (ref 8–27)
CO2: 24 mmol/L (ref 20–29)
CREATININE: 0.89 mg/dL (ref 0.76–1.27)
Calcium: 9.6 mg/dL (ref 8.6–10.2)
Chloride: 104 mmol/L (ref 96–106)
GFR calc Af Amer: 91 mL/min/{1.73_m2} (ref >59)
GFR calc non Af Amer: 79 mL/min/{1.73_m2} (ref >59)
GLUCOSE: 111 mg/dL — AB (ref 65–99)
POTASSIUM: 4.4 mmol/L (ref 3.5–5.2)
SODIUM: 141 mmol/L (ref 134–144)

## 2017-11-01 LAB — MAGNESIUM: Magnesium: 2.3 mg/dL (ref 1.6–2.3)

## 2017-11-01 NOTE — Addendum Note (Signed)
Addended by: Campbell Riches on: 11/01/2017 09:24 AM   Modules accepted: Orders

## 2017-11-02 ENCOUNTER — Telehealth: Payer: Self-pay | Admitting: Internal Medicine

## 2017-11-02 NOTE — Telephone Encounter (Signed)
Informed pt of lab results. Pt verbalized understanding. 

## 2017-11-02 NOTE — Telephone Encounter (Signed)
New message   Patient calling back for lab results. Please call

## 2017-11-04 DIAGNOSIS — J441 Chronic obstructive pulmonary disease with (acute) exacerbation: Secondary | ICD-10-CM | POA: Diagnosis not present

## 2017-11-09 ENCOUNTER — Ambulatory Visit (INDEPENDENT_AMBULATORY_CARE_PROVIDER_SITE_OTHER): Payer: Medicare Other | Admitting: Internal Medicine

## 2017-11-09 ENCOUNTER — Other Ambulatory Visit (INDEPENDENT_AMBULATORY_CARE_PROVIDER_SITE_OTHER): Payer: Medicare Other

## 2017-11-09 ENCOUNTER — Encounter: Payer: Self-pay | Admitting: Internal Medicine

## 2017-11-09 VITALS — BP 136/82 | HR 87 | Ht 67.0 in | Wt 212.0 lb

## 2017-11-09 DIAGNOSIS — Z8709 Personal history of other diseases of the respiratory system: Secondary | ICD-10-CM | POA: Diagnosis not present

## 2017-11-09 DIAGNOSIS — Z87891 Personal history of nicotine dependence: Secondary | ICD-10-CM | POA: Diagnosis not present

## 2017-11-09 LAB — CBC WITH DIFFERENTIAL/PLATELET
BASOS PCT: 1.3 % (ref 0.0–3.0)
Basophils Absolute: 0.1 10*3/uL (ref 0.0–0.1)
EOS ABS: 0.3 10*3/uL (ref 0.0–0.7)
Eosinophils Relative: 3.3 % (ref 0.0–5.0)
HCT: 46.3 % (ref 39.0–52.0)
Hemoglobin: 15.3 g/dL (ref 13.0–17.0)
Lymphocytes Relative: 30.5 % (ref 12.0–46.0)
Lymphs Abs: 2.7 10*3/uL (ref 0.7–4.0)
MCHC: 33 g/dL (ref 30.0–36.0)
MCV: 92.1 fl (ref 78.0–100.0)
MONO ABS: 1 10*3/uL (ref 0.1–1.0)
Monocytes Relative: 11.7 % (ref 3.0–12.0)
NEUTROS ABS: 4.7 10*3/uL (ref 1.4–7.7)
Neutrophils Relative %: 53.2 % (ref 43.0–77.0)
PLATELETS: 288 10*3/uL (ref 150.0–400.0)
RBC: 5.03 Mil/uL (ref 4.22–5.81)
RDW: 14.9 % (ref 11.5–15.5)
WBC: 8.8 10*3/uL (ref 4.0–10.5)

## 2017-11-09 MED ORDER — TIOTROPIUM BROMIDE MONOHYDRATE 2.5 MCG/ACT IN AERS: 2.0000 | INHALATION_SPRAY | Freq: Every day | RESPIRATORY_TRACT | 0 refills | Status: DC

## 2017-11-09 NOTE — Progress Notes (Signed)
Subjective:    Patient ID: Cody Gomez, male    DOB: [birth year], 83 y.o.   MRN: [id number]  PCP Cyndy Freeze, MD   HPI  IOV 11/09/2017  Chief Complaint  Patient presents with  . Advice Only    Former [name] pt last seen 10/29/14 with dx copd.  C/o cough with white mucus and SOB if he does anything for a long time period. Denies any CP.      83 year old male establishing care for COPD with a history of recurrent COPD exacerbation.  History is gained from talking to him, his son and review of the old chart.  He is to be seen by Dr. [name] up on 01/2014 and then because he lives in [place] he decided to just seek local care.  He understands state that approximately 20 years ago he was exposed to some sort of a chemical and since then he has a history of asthma.  He also has a greater than 70 pack smoking history but he is quit.  And since then he gets frequent respiratory exacerbations.  Just in the last few years he has had prednisone at least 5 or 10 times.  He tells me that every time he gets an exacerbation prednisone and steroids are the only thing that helps him.  He says local doctors are positive by this and are getting increasingly reluctant to treat him.  Occasionally exacerbations are treated by antibiotics.  Most recent prednisone was 2 days ago.  In between exacerbations he is maintained on Symbicort he takes albuterol and duo nebs as needed.  Currently has baseline COPD Score is 20 which is his baseline symptomatology.  This gets worse with exacerbations.  He thinks he might have spring and pollen allergy.  Of note, in May 2018 he had a CT scan of the chest that showed an enlarged goiter compressing his airway and causing venous occlusion.  In June 2018 this was excised.  He does not think his respiratory issues are related to the goiter despite airway compromise.  However he does describe a lot of his respiratory symptoms by pointing to the upper  airway.  At this point in time he feels it is too soon to know if thyroidectomy is going to improve his respiratory status but he remains pessimistic.  May 2018 CT scan of the chest did not show any lung cancer.  I personally visualized the CT scan.  His alpha-1 status is not known   CAT COPD Symptom & Quality of Life Score (GSK trademark) 0 is no burden. 5 is highest burden 11/09/2017   Never Cough -> Cough all the time 2  No phlegm in chest -> Chest is full of phlegm 3  No chest tightness -> Chest feels very tight 2  No dyspnea for 1 flight stairs/hill -> Very dyspneic for 1 flight of stairs 4  No limitations for ADL at home -> Very limited with ADL at home 0  Confident leaving home -> Not at all confident leaving home 2  Sleep soundly -> Do not sleep soundly because of lung condition 4  Lots of Energy -> No energy at all 3  TOTAL Score (max 40)  20    IMPRESSION: ct chest may 2018 1. SVC is patent. 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 2. Aortic atherosclerosis (ICD10-170.0). Coronary artery calcification. 3. Markedly enlarged, nodular and heterogeneous thyroid, suggestive of a goiter, with  associated airway narrowing. Sonographic evaluation performed 02/24/2016. 4. Cholelithiasis.   Electronically Signed   By: Lorin Picket M.D.   On: 04/25/2017 10:39    has a past medical history of Asthma, Atrial fibrillation ([place]), Automatic implantable cardioverter-defibrillator in situ (10/29/10), CHF (congestive heart failure) (HCC), Chronic lower back pain, GERD (gastroesophageal reflux disease), History of kidney stones, History of stomach ulcers, HLD (hyperlipidemia), HTN (hypertension), Hyperthyroidism, Hypothyroidism, ICD (implantable cardioverter-defibrillator) lead failure over sensing with inhibition of pacing (05/08/2015), Myocardial infarction [facility]), Nonischemic dilated cardiomyopathy ([place]),  OSA on CPAP, Pneumonia, Prostate cancer ([place]), Skin cancer, and Ventricular tachycardia ([place]).   reports that he quit smoking about 34 years ago. His smoking use included cigarettes, pipe, and cigars. He has a 76.00 pack-year smoking history. He quit smokeless tobacco use about 33 years ago. His smokeless tobacco use included chew.  Past Surgical History:  Procedure Laterality Date  . ABLATION  03/26/2014   EPS/RFCA by Dr Rayann Heman - 3 out of 4 VT's successfully ablated  . APPENDECTOMY    . BIV UPGRADE N/A 04/25/2017   Procedure: BiV Upgrade;  Surgeon: Deboraha Sprang, MD;  Location: [place] [facility];  Service: Cardiovascular;  Laterality: N/A;  . BIV UPGRADE N/A 07/28/2017   Procedure: BiVI Upgrade;  Surgeon: Deboraha Sprang, MD;  Location: [place] [facility];  Service: Cardiovascular;  Laterality: N/A;  . CARDIAC DEFIBRILLATOR PLACEMENT  07/18/2007   MDT ICD implanted with a SJM [id number] RV leald  . IMPLANTABLE CARDIOVERTER DEFIBRILLATOR GENERATOR CHANGE  06/22/13   medtronic Evera XT DR generator change peformed by Dr [name]  . INGUINAL HERNIA REPAIR Right   . LEAD REVISION N/A 06/22/2013   Procedure: LEAD REVISION;  Surgeon: Deboraha Sprang, MD;  Location: [facility] C[facility];  Service: Cardiovascular;  Laterality: N/A;  . PROSTATECTOMY    . RIGHT/LEFT HEART CATH AND CORONARY ANGIOGRAPHY N/A 02/28/2017   Procedure: Right/Left Heart Cath and Coronary Angiography;  Surgeon: [name], MD;  Location: [place] [facility];  Service: Cardiovascular;  Laterality: N/A;  . SKIN CANCER EXCISION     "back; chin"  . THYROIDECTOMY  05/26/2017  . THYROIDECTOMY N/A 05/26/2017   Procedure: TOTAL THYROIDECTOMY;  Surgeon: Armandina Gemma, MD;  Location: [place];  Service: General;  Laterality: N/A;  . V-TACH ABLATION N/A 03/26/2014   Procedure: V-TACH ABLATION;  Surgeon: Coralyn Mark, MD;  Location: [place] CATH LAB;  Service: Cardiovascular;  Laterality: N/A;    Allergies  Allergen Reactions  . Dobutamine Other (See  Comments)    ASYSTOLE  . Penicillins Swelling    SWELLING AT INJECTION SITE  Has patient had a PCN reaction causing immediate rash, facial/tongue/throat swelling, SOB or lightheadedness with hypotension: No Has patient had a PCN reaction causing severe rash involving mucus membranes or skin necrosis: No Has patient had a PCN reaction that required hospitalization: No Has patient had a PCN reaction occurring within the last 10 years: No If all of the above answers are "NO", then may proceed with Cephalosporin use.     Immunization History  Administered Date(s) Administered  . Influenza Split 09/12/2012, 08/13/2013  . Influenza Whole 09/12/2010, 10/14/2011  . Influenza, High Dose Seasonal PF 10/19/2017  . Influenza-Unspecified 09/12/2014  . Pneumococcal Conjugate-13 06/18/2014  . Pneumococcal Polysaccharide-23 09/12/2009  . Zoster 07/19/2012    Family History  Problem Relation Age of Onset  . Heart disease Mother   . Cancer Father        bladder  Current Outpatient Medications:  .  carvedilol (COREG) 12.5 MG tablet, TK 1 T PO BID, Disp: , Rfl: 0 .  dofetilide (TIKOSYN) 500 MCG capsule, TAKE 1 CAPSULE BY MOUTH TWICE DAILY, Disp: 60 capsule, Rfl: 11 .  furosemide (LASIX) 40 MG tablet, Take 40 mg by mouth daily., Disp: , Rfl:  .  guaiFENesin (MUCINEX) 600 MG 12 hr tablet, Take 1,200 mg by mouth daily as needed (congestion)., Disp: , Rfl:  .  ipratropium-albuterol (DUONEB) 0.5-2.5 (3) MG/3ML SOLN, Inhale 3 mLs into the lungs every 4 (four) hours as needed for shortness of breath or wheezing., Disp: , Rfl: 4 .  levothyroxine (SYNTHROID) 100 MCG tablet, Take 1 tablet (100 mcg total) by mouth daily., Disp: 30 tablet, Rfl: 0 .  mexiletine (MEXITIL) 150 MG capsule, TAKE 1 CAPSULE(150 MG) BY MOUTH TWICE DAILY, Disp: 60 capsule, Rfl: 10 .  naproxen sodium (ALEVE) 220 MG tablet, Take 440 mg by mouth 2 (two) times daily as needed (pain or headache). , Disp: , Rfl:  .  NITROSTAT 0.4 MG  SL tablet, Place 1 tablet under the tongue every 5 (five) minutes as needed for chest pain. , Disp: , Rfl:  .  potassium chloride SA (K-DUR,KLOR-CON) 20 MEQ tablet, Take 2 tablets (40 meq) by mouth once daily on Mondays, Wednesdays, and Fridays, and 1 tablet (20 meq) once daily all other days of the week, Disp: 60 tablet, Rfl: 3 .  SYMBICORT 160-4.5 MCG/ACT inhaler, Inhale 1 puff into the lungs 2 (two) times daily as needed (shortness of breath). , Disp: , Rfl: 0 .  warfarin (COUMADIN) 5 MG tablet, Take 2.5-5 mg by mouth at bedtime. Takes 2.5 mg (1/2 tab) on Monday, Wednesday, Friday, and Sunday. Takes 5 mg on Tuesday, Thursday, and Saturday, Disp: , Rfl:  .  carvedilol (COREG) 12.5 MG tablet, Take 1 tablet (12.5 mg total) by mouth 2 (two) times daily., Disp: 180 tablet, Rfl: 3 .  VENTOLIN HFA 108 (90 Base) MCG/ACT inhaler, INHALE 2 PUFFS Q 4-6 H, Disp: , Rfl: 0    Review of Systems  Constitutional: Negative for fever and unexpected weight change.  HENT: Positive for congestion, nosebleeds and trouble swallowing. Negative for dental problem, ear pain, postnasal drip, rhinorrhea, sinus pressure, sneezing and sore throat.   Eyes: Negative for redness and itching.  Respiratory: Positive for cough, shortness of breath and wheezing. Negative for chest tightness.   Cardiovascular: Positive for leg swelling. Negative for palpitations.  Gastrointestinal: Negative for nausea and vomiting.  Genitourinary: Negative for dysuria.  Musculoskeletal: Negative for joint swelling.  Skin: Negative for rash.  Allergic/Immunologic: Negative.  Negative for environmental allergies, food allergies and immunocompromised state.  Neurological: Positive for headaches.  Hematological: Bruises/bleeds easily.  Psychiatric/Behavioral: Negative for dysphoric mood. The patient is not nervous/anxious.        Objective:   Physical Exam  Constitutional: He is oriented to person, place, and time. He appears well-developed and  well-nourished. No distress.  HENT:  Head: Normocephalic and atraumatic.  Right Ear: External ear normal.  Left Ear: External ear normal.  Mouth/Throat: Oropharynx is clear and moist. No oropharyngeal exudate.  Eyes: Conjunctivae and EOM are normal. Pupils are equal, round, and reactive to light. Right eye exhibits no discharge. Left eye exhibits no discharge. No scleral icterus.  Neck: Normal range of motion. Neck supple. No JVD present. No tracheal deviation present. No thyromegaly present.  Cardiovascular: Normal rate, regular rhythm and intact distal pulses. Exam reveals no gallop and no friction  rub.  No murmur heard. Pulmonary/Chest: Effort normal and breath sounds normal. No respiratory distress. He has no wheezes. He has no rales. He exhibits no tenderness.  Abdominal: Soft. Bowel sounds are normal. He exhibits no distension and no mass. There is no tenderness. There is no rebound and no guarding.  Obese abd   Musculoskeletal: Normal range of motion. He exhibits no edema or tenderness.  Lymphadenopathy:    He has no cervical adenopathy.  Neurological: He is alert and oriented to person, place, and time. He has normal reflexes. No cranial nerve deficit. Coordination normal.  Skin: Skin is warm and dry. No rash noted. He is not diaphoretic. No erythema. No pallor.  Psychiatric: He has a normal mood and affect. His behavior is normal. Judgment and thought content normal.  Nursing note and vitals reviewed.   Vitals:   11/09/17 1104  BP: 136/82  Pulse: 87  SpO2: 94%  Weight: 212 lb (96.2 kg)  Height: 5\' 7"  (1.702 m)    Estimated body mass index is 33.2 kg/m as calculated from the following:   Height as of this encounter: 5\' 7"  (1.702 m).   Weight as of this encounter: 212 lb (96.2 kg).       Assessment & Plan:     ICD-10-CM   1. History of asthma Z87.09   2. History of smoking 30 or more pack years Z87.891   3. History of COPD Z87.09     Check blood alpha 1  phenotype, IgE, cbc with diff, and blood allergy panel In future if needed will get sinus CT Continue symbicort STart spiriva respimat Do full PFT in 4-6 weeks  followup - return to see APP in 4-6 weeks to discuss above results    Dr. Brand Males, M.D., [facility].C.P Pulmonary and Critical Care Medicine Staff Physician, [place] Director - Interstitial Lung Disease  Program  Pulmonary [place] [facility] [place], [place], [id number]  Pager: [phone], If no answer or between  15:00h - 7:00h: call [phone] Telephone: [phone]

## 2017-11-09 NOTE — Patient Instructions (Signed)
ICD-10-CM   1. History of asthma Z87.09   2. History of smoking 30 or more pack years Z87.891   3. History of COPD Z87.09     Check blood alpha 1 phenotype, IgE, cbc with diff, and blood allergy panel In future if needed will get sinus CT Continue symbicort STart spiriva respimat Do full PFT in 4-6 weeks  followup - return to see APP in 4-6 weeks to discuss above results

## 2017-11-11 DIAGNOSIS — Z7901 Long term (current) use of anticoagulants: Secondary | ICD-10-CM | POA: Diagnosis not present

## 2017-11-11 LAB — RESPIRATORY ALLERGY PROFILE REGION II ~~LOC~~
Allergen, A. alternata, m6: <0.1 kU/L
Allergen, Cedar tree, t12: <0.1 kU/L
Allergen, Comm Silver Birch, t9: <0.1 kU/L
Allergen, Cottonwood, t14: <0.1 kU/L
Allergen, D pternoyssinus,d7: <0.1 kU/L
Allergen, P. notatum, m1: <0.1 kU/L
Aspergillus fumigatus, m3: <0.1 kU/L
CLASS: 0
CLASS: 0
CLASS: 0
CLASS: 0
CLASS: 0
CLASS: 0
CLASS: 0
CLASS: 0
CLASS: 0
COMMON RAGWEED (SHORT) (W1) IGE: <0.1 kU/L
Cat Dander: <0.1 kU/L
Class: 0
Class: 0
Class: 0
Class: 0
Class: 0
Class: 0
Class: 0
Class: 0
Class: 0
Class: 0
Class: 0
Class: 0
Class: 0
Class: 0
Class: 0
D. farinae: <0.1 kU/L
Dog Dander: <0.1 kU/L
IGE (IMMUNOGLOBULIN E), SERUM: 109 kU/L (ref <=114)
Pecan/Hickory Tree IgE: <0.1 kU/L
Timothy Grass: <0.1 kU/L

## 2017-11-11 LAB — INTERPRETATION:

## 2017-11-15 ENCOUNTER — Telehealth: Payer: Self-pay | Admitting: Internal Medicine

## 2017-11-15 NOTE — Telephone Encounter (Signed)
Cardiology does not (normally) prescribe Dulera as it is an inhaler used to control and prevent symptoms of asthma in people age 81 years and older.  Also, Ruthe Mannan is not listed in the pts history or current medications list.  This more than likely needs to go to the pts PCP.

## 2017-11-15 NOTE — Telephone Encounter (Signed)
[employer] calling in reference to a prior-authorization for Dulera 200 microgram-5 microgram inhaler.   Reference # [id number]

## 2017-11-17 NOTE — Telephone Encounter (Signed)
Follow up    Ref [id number]   [facility] prior-authorization , please call

## 2017-11-18 DIAGNOSIS — Z7901 Long term (current) use of anticoagulants: Secondary | ICD-10-CM | POA: Diagnosis not present

## 2017-11-23 DIAGNOSIS — H2513 Age-related nuclear cataract, bilateral: Secondary | ICD-10-CM | POA: Diagnosis not present

## 2017-11-23 NOTE — Telephone Encounter (Addendum)
Cody Gomez is an inhaler that is not listed in the pts chart so Dr [name] did not prescribe. Please refer this call to the pts PCP.

## 2017-11-24 ENCOUNTER — Other Ambulatory Visit: Payer: Self-pay | Admitting: Internal Medicine

## 2017-11-24 ENCOUNTER — Telehealth: Payer: Self-pay | Admitting: *Deleted

## 2017-11-24 NOTE — Telephone Encounter (Signed)
Prior approval completed for MEXILETINE and faxed to [facility].

## 2017-11-29 DIAGNOSIS — H2511 Age-related nuclear cataract, right eye: Secondary | ICD-10-CM | POA: Diagnosis not present

## 2017-11-30 DIAGNOSIS — Z6832 Body mass index (BMI) 32.0-32.9, adult: Secondary | ICD-10-CM | POA: Diagnosis not present

## 2017-11-30 DIAGNOSIS — Z7901 Long term (current) use of anticoagulants: Secondary | ICD-10-CM | POA: Diagnosis not present

## 2017-11-30 DIAGNOSIS — J189 Pneumonia, unspecified organism: Secondary | ICD-10-CM | POA: Diagnosis not present

## 2017-12-01 ENCOUNTER — Ambulatory Visit (INDEPENDENT_AMBULATORY_CARE_PROVIDER_SITE_OTHER): Payer: Medicare Other

## 2017-12-01 DIAGNOSIS — I5022 Chronic systolic (congestive) heart failure: Secondary | ICD-10-CM | POA: Diagnosis not present

## 2017-12-01 DIAGNOSIS — Z95810 Presence of automatic (implantable) cardiac defibrillator: Secondary | ICD-10-CM

## 2017-12-02 ENCOUNTER — Telehealth: Payer: Self-pay | Admitting: Internal Medicine

## 2017-12-02 NOTE — Progress Notes (Signed)
EPIC Encounter for ICM Monitoring  Patient Name: Cody Gomez is a 83 y.o. male Date: 12/02/2017 Primary Care Physican: Cyndy Freeze, MD Primary [place] Electrophysiologist: [name] Dry Weight: 212 lbs       Heart Failure questions reviewed, pt symptomatic with weight gain of 4-5 pounds in the last 3 weeks.  Patient has respiratory infection, he is taking antibiotics, steroids, and using inhalers.  Patient had audible wheezing over the phone but he said it has improved a lot.     Thoracic impedance abnormal suggesting fluid accumulation since 11/24/2017.  Prescribed dosage: Furosemide 40 mg every day. Potassium 20 mEqtake 1 tablet by mouth twice daily.   Labs: 10/31/2017 Creatinine 0.89, BUN 16, Potassium 4.4, Sodium 141, EGFR 79-91 07/22/2017 Creatinine1.00, BUN21, Potassium4.1, Sodium141, [id number] 05/27/2017 Creatinine0.92, BUN13, Potassium3.7, Sodium136, EGFR>60  05/26/2017 Creatinine0.86, BUN18, Potassium3.7, Sodium137, EGFR>60  04/19/2017 Creatinine0.92, BUN21, Potassium4.6, [id number], [id number] 02/23/2017 Creatinine 0.91, BUN 15, Potassium 4.0, Sodium 140, EGFR >60 02/18/2017 Creatinine 1.00, BUN 17, Potassium 4.5, Sodium 138, EGFR 69-80  01/20/2017 Creatinine 0.94, BUN 16, Potassium 3.8, Sodium 138, EGFR 75-86  01/19/2017 Creatinine 0.87, BUN 18, Potassium 4.7, Sodium 140  Recommendations:  Advised to take Furosemide 40 mg 1 tablet twice a day x 2 days and take 1 additional Potassium tablet x 2 days. He repeated back instructions correctly.  Patient has been eating country ham and bacon in the last couple of weeks.  Advised to limit salt intake.    Follow-up plan: ICM clinic phone appointment on 12/15/2017 to recheck fluid levels.    Copy of ICM check sent to Dr. [name].   3 month ICM trend: 12/01/2017    1 Year ICM trend:       Rosalene Billings, RN 12/02/2017 3:33 PM

## 2017-12-02 NOTE — Telephone Encounter (Signed)
Ok thank you. Noted.

## 2017-12-02 NOTE — Telephone Encounter (Signed)
Spoke with pt, he states he saw his PCP Dr. Cathi Roan and he was given kenalog, Cefidnir  and prednisone. He states he is doing better and just wanted MR to know.

## 2017-12-14 DIAGNOSIS — Z7901 Long term (current) use of anticoagulants: Secondary | ICD-10-CM | POA: Diagnosis not present

## 2017-12-15 ENCOUNTER — Ambulatory Visit (INDEPENDENT_AMBULATORY_CARE_PROVIDER_SITE_OTHER): Payer: Self-pay

## 2017-12-15 DIAGNOSIS — I5022 Chronic systolic (congestive) heart failure: Secondary | ICD-10-CM

## 2017-12-15 DIAGNOSIS — Z95810 Presence of automatic (implantable) cardiac defibrillator: Secondary | ICD-10-CM

## 2017-12-15 NOTE — Progress Notes (Signed)
EPIC Encounter for ICM Monitoring  Patient Name: Cody Gomez is a 84 y.o. male Date: 12/15/2017 Primary Care Physican: Cyndy Freeze, MD Primary [place] Electrophysiologist: [name] Dry Weight: Last known weight 212 lbs       Heart Failure questions reviewed, pt said he does not have shortness of breath when bending over.  Has not weighed.    Thoracic impedance returned to normal after taking extra Furosemide and advising to limit salt intake.  Prescribed dosage: Furosemide 40 mg every day. Potassium 20 mEqtake 1 tablet by mouth twice daily.   Labs: 10/31/2017 Creatinine 0.89, BUN 16, Potassium 4.4, Sodium 141, EGFR 79-91 07/22/2017 Creatinine1.00, BUN21, Potassium4.1, Sodium141, [id number] 05/27/2017 Creatinine0.92, BUN13, Potassium3.7, Sodium136, EGFR>60  05/26/2017 Creatinine0.86, BUN18, Potassium3.7, Sodium137, EGFR>60  04/19/2017 Creatinine0.92, BUN21, Potassium4.6, [id number], [id number] 02/23/2017 Creatinine 0.91, BUN 15, Potassium 4.0, Sodium 140, EGFR >60 02/18/2017 Creatinine 1.00, BUN 17, Potassium 4.5, Sodium 138, EGFR 69-80  01/20/2017 Creatinine 0.94, BUN 16, Potassium 3.8, Sodium 138, EGFR 75-86  01/19/2017 Creatinine 0.87, BUN 18, Potassium 4.7, Sodium 140  Recommendations: No changes.   Encouraged to call for fluid symptoms.  Follow-up plan: ICM clinic phone appointment on 01/03/2018.    Copy of ICM check sent to Dr. [name].   3 month ICM trend: 12/15/2017    1 Year ICM trend:       Rosalene Billings, RN 12/15/2017 5:19 PM

## 2018-01-02 ENCOUNTER — Ambulatory Visit (INDEPENDENT_AMBULATORY_CARE_PROVIDER_SITE_OTHER): Payer: PPO | Admitting: Internal Medicine

## 2018-01-02 ENCOUNTER — Ambulatory Visit: Payer: PPO | Admitting: Internal Medicine

## 2018-01-02 ENCOUNTER — Encounter: Payer: Self-pay | Admitting: Internal Medicine

## 2018-01-02 VITALS — BP 134/78 | HR 71 | Ht 67.0 in | Wt 213.8 lb

## 2018-01-02 DIAGNOSIS — J683 Other acute and subacute respiratory conditions due to chemicals, gases, fumes and vapors: Secondary | ICD-10-CM | POA: Diagnosis not present

## 2018-01-02 DIAGNOSIS — Z77098 Contact with and (suspected) exposure to other hazardous, chiefly nonmedicinal, chemicals: Secondary | ICD-10-CM

## 2018-01-02 DIAGNOSIS — Z8709 Personal history of other diseases of the respiratory system: Secondary | ICD-10-CM

## 2018-01-02 LAB — PULMONARY FUNCTION TEST
DL/VA % pred: 111 %
DL/VA: 4.88 ml/min/mmHg/L
DLCO cor % pred: 91 %
DLCO cor: 25.91 ml/min/mmHg
DLCO unc % pred: 84 %
DLCO unc: 23.78 ml/min/mmHg
FEF 25-75 Post: 0.96 L/sec
FEF 25-75 Pre: 1.14 L/sec
FEF2575-%Change-Post: -16 %
FEF2575-%Pred-Post: 65 %
FEF2575-%Pred-Pre: 77 %
FEV1-%CHANGE-POST: -3 %
FEV1-%Pred-Post: 87 %
FEV1-%Pred-Pre: 90 %
FEV1-Post: 2.02 L
FEV1-Pre: 2.09 L
FEV1FVC-%Change-Post: 4 %
FEV1FVC-%Pred-Pre: 97 %
FEV6-%Change-Post: -5 %
FEV6-%PRED-PRE: 96 %
FEV6-%Pred-Post: 91 %
FEV6-POST: 2.8 L
FEV6-PRE: 2.95 L
FEV6FVC-%Change-Post: 1 %
FEV6FVC-%PRED-POST: 108 %
FEV6FVC-%Pred-Pre: 106 %
FVC-%CHANGE-POST: -7 %
FVC-%PRED-POST: 84 %
FVC-%Pred-Pre: 90 %
FVC-POST: 2.8 L
FVC-PRE: 3.01 L
POST FEV6/FVC RATIO: 100 %
PRE FEV6/FVC RATIO: 98 %
Post FEV1/FVC ratio: 72 %
Pre FEV1/FVC ratio: 69 %
RV % pred: 124 %
RV: 3.22 L
TLC % PRED: 91 %
TLC: 5.9 L

## 2018-01-02 LAB — NITRIC OXIDE: Nitric Oxide: 116

## 2018-01-02 MED ORDER — FLUTICASONE FUROATE-VILANTEROL 200-25 MCG/INH IN AEPB: 1.0000 | INHALATION_SPRAY | Freq: Every day | RESPIRATORY_TRACT | 0 refills | Status: DC

## 2018-01-02 MED ORDER — PREDNISONE 10 MG PO TABS: ORAL_TABLET | ORAL | 0 refills | Status: DC

## 2018-01-02 MED ORDER — FLUTICASONE FUROATE-VILANTEROL 200-25 MCG/INH IN AEPB: 1.0000 | INHALATION_SPRAY | Freq: Every day | RESPIRATORY_TRACT | 5 refills | Status: DC

## 2018-01-02 MED ORDER — DOXYCYCLINE HYCLATE 100 MG PO TABS: 100.0000 mg | ORAL_TABLET | Freq: Two times a day (BID) | ORAL | 0 refills | Status: DC

## 2018-01-02 NOTE — Addendum Note (Signed)
Addended by: Lorretta Harp on: 01/02/2018 04:59 PM   Modules accepted: Orders

## 2018-01-02 NOTE — Patient Instructions (Signed)
ICD-10-CM   1. Reactive airways dysfunction syndrome, moderate persistent, with acute exacerbation (HCC) J68.3   2. Occupational exposure to chemicals Z77.098     Although he smoked and you are a candidate for COPD the fact that . CT scan did not report any emphysema and your breathing test is normal I do not think you have COPD/emphysema  On the other hand based on the history of chemical exposure over 20 years ago and developing acute respiratory symptoms immediately after that and having recurrent bronchitis and current exam nitric oxide being high the diagnosis is reactive airway dysfunction syndrome  (RADS) Asthma  Plan  - we treated this very similar to asthma/COPD - change symbicort to high dose breo  - Take prednisone 40 mg daily x 2 days, then 20mg  daily x 2 days, then 10mg  daily x 2 days, then 5mg  daily x 2 days and stop - Take doxycycline 100mg  po twice daily x 5 days; take after meals and avoid sunlight - depending on course in future consider spiriva or asthma biologics  Followup 3 months or sooner; ACQ at followup

## 2018-01-02 NOTE — Addendum Note (Signed)
Addended by: Lorretta Harp on: 01/02/2018 12:09 PM   Modules accepted: Orders
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
## 2018-01-02 NOTE — Progress Notes (Signed)
PFT completed today 01/02/18

## 2018-01-02 NOTE — Addendum Note (Signed)
Addended by: Lorretta Harp on: 01/02/2018 12:48 PM   Modules accepted: Orders

## 2018-01-03 ENCOUNTER — Ambulatory Visit (INDEPENDENT_AMBULATORY_CARE_PROVIDER_SITE_OTHER): Payer: PPO

## 2018-01-03 DIAGNOSIS — Z95810 Presence of automatic (implantable) cardiac defibrillator: Secondary | ICD-10-CM

## 2018-01-03 DIAGNOSIS — I428 Other cardiomyopathies: Secondary | ICD-10-CM | POA: Diagnosis not present

## 2018-01-03 NOTE — Progress Notes (Signed)
EPIC Encounter for ICM Monitoring  Patient Name: Cody Gomez is a 84 y.o. male Date: 01/03/2018 Primary Care Physican: Cyndy Freeze, MD Primary [place] Electrophysiologist: [name] Dry Weight: 213 lbs BiV Pacing: 99.1%         Heart Failure questions reviewed, pt asymptomatic.   Thoracic impedance normal.  Prescribed dosage: Furosemide 40 mg every day. Potassium 20 mEqtake 1 tablet by mouth twice daily.  Labs: 10/31/2017 Creatinine 0.89, BUN 16, Potassium 4.4, Sodium 141, EGFR 79-91 07/22/2017 Creatinine1.00, BUN21, Potassium4.1, Sodium141, [id number] 05/27/2017 Creatinine0.92, BUN13, Potassium3.7, Sodium136, EGFR>60  05/26/2017 Creatinine0.86, BUN18, Potassium3.7, Sodium137, EGFR>60  04/19/2017 Creatinine0.92, BUN21, Potassium4.6, [id number], [id number] 02/23/2017 Creatinine 0.91, BUN 15, Potassium 4.0, Sodium 140, EGFR >60 02/18/2017 Creatinine 1.00, BUN 17, Potassium 4.5, Sodium 138, EGFR 69-80  01/20/2017 Creatinine 0.94, BUN 16, Potassium 3.8, Sodium 138, EGFR 75-86  01/19/2017 Creatinine 0.87, BUN 18, Potassium 4.7, Sodium 140  Recommendations: No changes.  Encouraged to call for fluid symptoms.  Follow-up plan: ICM clinic phone appointment on 02/03/2018.    Copy of ICM check sent to Dr. [name].   3 month ICM trend: 01/03/2018    1 Year ICM trend:       Rosalene Billings, RN 01/03/2018 9:22 AM

## 2018-01-05 DIAGNOSIS — Z7901 Long term (current) use of anticoagulants: Secondary | ICD-10-CM | POA: Diagnosis not present

## 2018-01-07 ENCOUNTER — Other Ambulatory Visit: Payer: Self-pay | Admitting: Internal Medicine

## 2018-01-10 DIAGNOSIS — Z95810 Presence of automatic (implantable) cardiac defibrillator: Secondary | ICD-10-CM | POA: Diagnosis not present

## 2018-01-10 DIAGNOSIS — I4891 Unspecified atrial fibrillation: Secondary | ICD-10-CM | POA: Diagnosis not present

## 2018-01-10 DIAGNOSIS — H259 Unspecified age-related cataract: Secondary | ICD-10-CM | POA: Diagnosis not present

## 2018-01-10 DIAGNOSIS — H2511 Age-related nuclear cataract, right eye: Secondary | ICD-10-CM | POA: Diagnosis not present

## 2018-01-10 DIAGNOSIS — Z7901 Long term (current) use of anticoagulants: Secondary | ICD-10-CM | POA: Diagnosis not present

## 2018-01-10 DIAGNOSIS — G4730 Sleep apnea, unspecified: Secondary | ICD-10-CM | POA: Diagnosis not present

## 2018-01-10 DIAGNOSIS — H52223 Regular astigmatism, bilateral: Secondary | ICD-10-CM | POA: Diagnosis not present

## 2018-01-10 DIAGNOSIS — I519 Heart disease, unspecified: Secondary | ICD-10-CM | POA: Diagnosis not present

## 2018-01-10 DIAGNOSIS — Z79899 Other long term (current) drug therapy: Secondary | ICD-10-CM | POA: Diagnosis not present

## 2018-01-10 DIAGNOSIS — J45909 Unspecified asthma, uncomplicated: Secondary | ICD-10-CM | POA: Diagnosis not present

## 2018-01-10 DIAGNOSIS — Z950 Presence of cardiac pacemaker: Secondary | ICD-10-CM | POA: Diagnosis not present

## 2018-01-10 DIAGNOSIS — I252 Old myocardial infarction: Secondary | ICD-10-CM | POA: Diagnosis not present

## 2018-01-13 DIAGNOSIS — Z7901 Long term (current) use of anticoagulants: Secondary | ICD-10-CM | POA: Diagnosis not present

## 2018-01-16 ENCOUNTER — Other Ambulatory Visit (HOSPITAL_COMMUNITY): Payer: Medicare Other

## 2018-01-20 ENCOUNTER — Encounter: Payer: Medicare Other | Admitting: Nurse Practitioner

## 2018-01-25 ENCOUNTER — Other Ambulatory Visit: Payer: Self-pay | Admitting: Internal Medicine

## 2018-01-27 DIAGNOSIS — H1032 Unspecified acute conjunctivitis, left eye: Secondary | ICD-10-CM | POA: Diagnosis not present

## 2018-01-27 DIAGNOSIS — Z6832 Body mass index (BMI) 32.0-32.9, adult: Secondary | ICD-10-CM | POA: Diagnosis not present

## 2018-01-27 DIAGNOSIS — Z9989 Dependence on other enabling machines and devices: Secondary | ICD-10-CM | POA: Diagnosis not present

## 2018-01-27 DIAGNOSIS — Z7901 Long term (current) use of anticoagulants: Secondary | ICD-10-CM | POA: Diagnosis not present

## 2018-01-27 DIAGNOSIS — Z0000 Encounter for general adult medical examination without abnormal findings: Secondary | ICD-10-CM | POA: Diagnosis not present

## 2018-01-27 DIAGNOSIS — G4733 Obstructive sleep apnea (adult) (pediatric): Secondary | ICD-10-CM | POA: Diagnosis not present

## 2018-01-27 DIAGNOSIS — J4520 Mild intermittent asthma, uncomplicated: Secondary | ICD-10-CM | POA: Diagnosis not present

## 2018-01-27 DIAGNOSIS — E890 Postprocedural hypothyroidism: Secondary | ICD-10-CM | POA: Diagnosis not present

## 2018-01-27 DIAGNOSIS — I482 Chronic atrial fibrillation: Secondary | ICD-10-CM | POA: Diagnosis not present

## 2018-01-27 DIAGNOSIS — I420 Dilated cardiomyopathy: Secondary | ICD-10-CM | POA: Diagnosis not present

## 2018-02-03 ENCOUNTER — Ambulatory Visit (INDEPENDENT_AMBULATORY_CARE_PROVIDER_SITE_OTHER): Payer: PPO

## 2018-02-03 ENCOUNTER — Other Ambulatory Visit: Payer: Self-pay | Admitting: Internal Medicine

## 2018-02-03 DIAGNOSIS — I5022 Chronic systolic (congestive) heart failure: Secondary | ICD-10-CM

## 2018-02-03 DIAGNOSIS — Z95810 Presence of automatic (implantable) cardiac defibrillator: Secondary | ICD-10-CM | POA: Diagnosis not present

## 2018-02-03 DIAGNOSIS — G4733 Obstructive sleep apnea (adult) (pediatric): Secondary | ICD-10-CM | POA: Diagnosis not present

## 2018-02-03 NOTE — Progress Notes (Signed)
EPIC Encounter for ICM Monitoring  Patient Name: Cody Gomez is a 84 y.o. male Date: 02/03/2018 Primary Care Physican: Cyndy Freeze, MD Primary [place] Electrophysiologist: [name] Dry Weight: 213 lbs BiV Pacing: 99.1%         Heart Failure questions reviewed, pt asymptomatic.   Thoracic impedance normal.  Prescribed dosage: Furosemide 40 mg every day. Potassium 20 mEqtake 1 tablet by mouth twice daily.  Labs: 10/31/2017 Creatinine 0.89, BUN 16, Potassium 4.4, Sodium 141, EGFR 79-91 07/22/2017 Creatinine1.00, BUN21, Potassium4.1, Sodium141, [id number] 05/27/2017 Creatinine0.92, BUN13, Potassium3.7, Sodium136, EGFR>60  05/26/2017 Creatinine0.86, BUN18, Potassium3.7, Sodium137, EGFR>60  04/19/2017 Creatinine0.92, BUN21, Potassium4.6, [id number], [id number] 02/23/2017 Creatinine 0.91, BUN 15, Potassium 4.0, Sodium 140, EGFR >60 02/18/2017 Creatinine 1.00, BUN 17, Potassium 4.5, Sodium 138, EGFR 69-80  01/20/2017 Creatinine 0.94, BUN 16, Potassium 3.8, Sodium 138, EGFR 75-86  01/19/2017 Creatinine 0.87, BUN 18, Potassium 4.7, Sodium 140  Recommendations: No changes.   Encouraged to call for fluid symptoms.  Follow-up plan: ICM clinic phone appointment on 04/03/2018.  Office appointment scheduled 03/03/2018 with Chanetta Marshall, NP.  Copy of ICM check sent to Dr. [name].   3 month ICM trend: 02/03/2018    1 Year ICM trend:       Rosalene Billings, RN 02/03/2018 9:03 AM

## 2018-02-03 NOTE — Telephone Encounter (Signed)
Medication Detail    Disp Refills Start End   dofetilide (TIKOSYN) 500 MCG capsule 180 capsule 3 01/09/2018    Sig: TAKE 1 CAPSULE BY MOUTH TWICE DAILY   Sent to pharmacy as: dofetilide (TIKOSYN) 500 MCG capsule   Notes to Pharmacy: **Patient requests 90 days supply**   E-Prescribing Status: Receipt confirmed by pharmacy (01/09/2018 11:40 AM EST)   Pharmacy   [facility] [id number] - [place], [address]

## 2018-02-06 ENCOUNTER — Telehealth: Payer: Self-pay | Admitting: Internal Medicine

## 2018-02-06 ENCOUNTER — Other Ambulatory Visit: Payer: Self-pay | Admitting: Internal Medicine

## 2018-02-06 DIAGNOSIS — Z7901 Long term (current) use of anticoagulants: Secondary | ICD-10-CM | POA: Diagnosis not present

## 2018-02-06 NOTE — Telephone Encounter (Signed)
°*  STAT* If patient is at the pharmacy, call can be transferred to refill team.   1. Which medications need to be refilled? (please list name of each medication and dose if known) dofetilide (TIKOSYN) 500 MCG capsule  2. Which pharmacy/location (including street and city if local pharmacy) is medication to be sent to? [facility] on [address] in [place]. Do they need a 30 day or 90 day supply? [place]

## 2018-02-06 NOTE — Telephone Encounter (Signed)
Called pt to inform him that he has refills at his pharmacy already. I called pt's pharmacy to inquire about the medication as well and they are getting pt's medication ready for pt to pick up. I advised the pt that if he has any other problems, questions or concerns to call the office. Pt verbalized understanding.

## 2018-02-07 DIAGNOSIS — H259 Unspecified age-related cataract: Secondary | ICD-10-CM | POA: Diagnosis not present

## 2018-02-07 DIAGNOSIS — E890 Postprocedural hypothyroidism: Secondary | ICD-10-CM | POA: Diagnosis not present

## 2018-02-07 DIAGNOSIS — H52223 Regular astigmatism, bilateral: Secondary | ICD-10-CM | POA: Diagnosis not present

## 2018-02-07 DIAGNOSIS — Z950 Presence of cardiac pacemaker: Secondary | ICD-10-CM | POA: Diagnosis not present

## 2018-02-07 DIAGNOSIS — Z79899 Other long term (current) drug therapy: Secondary | ICD-10-CM | POA: Diagnosis not present

## 2018-02-07 DIAGNOSIS — I4891 Unspecified atrial fibrillation: Secondary | ICD-10-CM | POA: Diagnosis not present

## 2018-02-07 DIAGNOSIS — Z95810 Presence of automatic (implantable) cardiac defibrillator: Secondary | ICD-10-CM | POA: Diagnosis not present

## 2018-02-07 DIAGNOSIS — H2512 Age-related nuclear cataract, left eye: Secondary | ICD-10-CM | POA: Diagnosis not present

## 2018-02-07 DIAGNOSIS — G4700 Insomnia, unspecified: Secondary | ICD-10-CM | POA: Diagnosis not present

## 2018-02-07 DIAGNOSIS — J45909 Unspecified asthma, uncomplicated: Secondary | ICD-10-CM | POA: Diagnosis not present

## 2018-02-07 DIAGNOSIS — Z7901 Long term (current) use of anticoagulants: Secondary | ICD-10-CM | POA: Diagnosis not present

## 2018-03-01 DIAGNOSIS — Z7901 Long term (current) use of anticoagulants: Secondary | ICD-10-CM | POA: Diagnosis not present
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
## 2018-03-03 ENCOUNTER — Other Ambulatory Visit: Payer: Self-pay

## 2018-03-03 ENCOUNTER — Ambulatory Visit: Payer: PPO | Admitting: Nurse Practitioner

## 2018-03-03 ENCOUNTER — Encounter: Payer: Self-pay | Admitting: Nurse Practitioner

## 2018-03-03 ENCOUNTER — Ambulatory Visit (HOSPITAL_COMMUNITY): Payer: PPO | Attending: Cardiovascular Disease

## 2018-03-03 VITALS — BP 130/72 | HR 83 | Ht 67.0 in | Wt 210.0 lb

## 2018-03-03 DIAGNOSIS — I5022 Chronic systolic (congestive) heart failure: Secondary | ICD-10-CM

## 2018-03-03 DIAGNOSIS — I472 Ventricular tachycardia, unspecified: Secondary | ICD-10-CM

## 2018-03-03 DIAGNOSIS — I110 Hypertensive heart disease with heart failure: Secondary | ICD-10-CM | POA: Diagnosis not present

## 2018-03-03 DIAGNOSIS — I480 Paroxysmal atrial fibrillation: Secondary | ICD-10-CM | POA: Diagnosis not present

## 2018-03-03 DIAGNOSIS — I442 Atrioventricular block, complete: Secondary | ICD-10-CM

## 2018-03-03 DIAGNOSIS — I428 Other cardiomyopathies: Secondary | ICD-10-CM

## 2018-03-03 DIAGNOSIS — E785 Hyperlipidemia, unspecified: Secondary | ICD-10-CM | POA: Diagnosis not present

## 2018-03-03 DIAGNOSIS — I4891 Unspecified atrial fibrillation: Secondary | ICD-10-CM | POA: Diagnosis not present

## 2018-03-03 LAB — BASIC METABOLIC PANEL
BUN / CREAT RATIO: 18 (ref 10–24)
BUN: 16 mg/dL (ref 8–27)
CHLORIDE: 102 mmol/L (ref 96–106)
CO2: 23 mmol/L (ref 20–29)
Calcium: 9.6 mg/dL (ref 8.6–10.2)
Creatinine, Ser: 0.9 mg/dL (ref 0.76–1.27)
GFR calc Af Amer: 90 mL/min/{1.73_m2} (ref >59)
GFR calc non Af Amer: 78 mL/min/{1.73_m2} (ref >59)
GLUCOSE: 87 mg/dL (ref 65–99)
Potassium: 3.9 mmol/L (ref 3.5–5.2)
SODIUM: 139 mmol/L (ref 134–144)

## 2018-03-03 LAB — MAGNESIUM: MAGNESIUM: 2.2 mg/dL (ref 1.6–2.3)

## 2018-03-03 MED ORDER — PERFLUTREN LIPID MICROSPHERE
1.0000 mL | INTRAVENOUS | Status: AC | PRN
  Administered 2018-03-03: 2 mL via INTRAVENOUS

## 2018-03-03 NOTE — Patient Instructions (Addendum)
Medication Instructions:   Your physician recommends that you continue on your current medications as directed. Please refer to the Current Medication list given to you today.   If you need a refill on your cardiac medications before your next appointment, please call your pharmacy.  Labwork:  BMET  AND MAG    Testing/Procedures: NONE ORDERED  TODAY    Follow-Up:    Your physician wants you to follow-up in:  IN [place] will receive a reminder letter in the mail two months in advance. If you don't receive a letter, please call our office to schedule the follow-up appointment.   Remote monitoring is used to monitor your Pacemaker of ICD from home. This monitoring reduces the number of office visits required to check your device to one time per year. It allows [place] to keep an eye on the functioning of your device to ensure it is working properly. You are scheduled for a device check from home on . 4-22- 19..You may send your transmission at any time that day. If you have a wireless device, the transmission will be sent automatically. After your physician reviews your transmission, you will receive a postcard with your next transmission date.     Any Other Special Instructions Will Be Listed Below (If Applicable).

## 2018-03-06 LAB — CUP PACEART INCLINIC DEVICE CHECK
Date Time Interrogation Session: 20190325102128
Implantable Lead Implant Date: 20080805
Implantable Lead Implant Date: 20180816
Implantable Lead Location: 753858
Implantable Lead Location: 753860
Implantable Lead Model: 7001
Implantable Pulse Generator Implant Date: 20180816
MDC IDC LEAD IMPLANT DT: 20080805
MDC IDC LEAD LOCATION: 753859

## 2018-03-08 ENCOUNTER — Telehealth: Payer: Self-pay

## 2018-03-08 DIAGNOSIS — Z7901 Long term (current) use of anticoagulants: Secondary | ICD-10-CM | POA: Diagnosis not present

## 2018-03-08 NOTE — Telephone Encounter (Signed)
**Note De-Identified Cody Gomez Obfuscation** Per the pts request I have done a Dofetilide tier exception through covermymeds.

## 2018-03-13 NOTE — Telephone Encounter (Signed)
**Note De-Identified Cody Gomez Obfuscation** Tier exception for Dofetilide has been approved through [place]. Approval good from 03/09/18 until 12/12/2018.  I have notified the pts pharmacy.

## 2018-03-15 DIAGNOSIS — Z7901 Long term (current) use of anticoagulants: Secondary | ICD-10-CM | POA: Diagnosis not present

## 2018-03-16 DIAGNOSIS — Z961 Presence of intraocular lens: Secondary | ICD-10-CM | POA: Diagnosis not present

## 2018-03-29 DIAGNOSIS — E785 Hyperlipidemia, unspecified: Secondary | ICD-10-CM | POA: Diagnosis not present

## 2018-03-29 DIAGNOSIS — E890 Postprocedural hypothyroidism: Secondary | ICD-10-CM | POA: Diagnosis not present

## 2018-03-29 DIAGNOSIS — I482 Chronic atrial fibrillation: Secondary | ICD-10-CM | POA: Diagnosis not present

## 2018-03-29 DIAGNOSIS — Z6831 Body mass index (BMI) 31.0-31.9, adult: Secondary | ICD-10-CM | POA: Diagnosis not present

## 2018-03-29 DIAGNOSIS — Z1331 Encounter for screening for depression: Secondary | ICD-10-CM | POA: Diagnosis not present

## 2018-03-29 DIAGNOSIS — I420 Dilated cardiomyopathy: Secondary | ICD-10-CM | POA: Diagnosis not present

## 2018-03-29 DIAGNOSIS — E669 Obesity, unspecified: Secondary | ICD-10-CM | POA: Diagnosis not present

## 2018-03-29 DIAGNOSIS — K219 Gastro-esophageal reflux disease without esophagitis: Secondary | ICD-10-CM | POA: Diagnosis not present

## 2018-03-29 DIAGNOSIS — Z7901 Long term (current) use of anticoagulants: Secondary | ICD-10-CM | POA: Diagnosis not present

## 2018-04-03 ENCOUNTER — Ambulatory Visit (INDEPENDENT_AMBULATORY_CARE_PROVIDER_SITE_OTHER): Payer: PPO

## 2018-04-03 DIAGNOSIS — I5022 Chronic systolic (congestive) heart failure: Secondary | ICD-10-CM

## 2018-04-03 DIAGNOSIS — Z95810 Presence of automatic (implantable) cardiac defibrillator: Secondary | ICD-10-CM | POA: Diagnosis not present

## 2018-04-04 ENCOUNTER — Telehealth: Payer: Self-pay

## 2018-04-04 NOTE — Progress Notes (Signed)
EPIC Encounter for ICM Monitoring  Patient Name: Cody Gomez is a 84 y.o. male Date: 04/04/2018 Primary Care Physican: Street, Sharon Mt, MD Primary [place] Electrophysiologist: [name] Dry Weight: Previous weight 213lbs BiV Pacing: 99.4%     Attempted call to patient and unable to reach.  Left detailed message regarding transmission.  Transmission reviewed.    Thoracic impedance normal.  Prescribed dosage: Furosemide 40 mg every day. Potassium 20 mEqtake 1 tablet by mouth twice daily.  Labs: 10/31/2017 Creatinine 0.89, BUN 16, Potassium 4.4, Sodium 141, EGFR 79-91 07/22/2017 Creatinine1.00, BUN21, Potassium4.1, Sodium141, [id number] 05/27/2017 Creatinine0.92, BUN13, Potassium3.7, Sodium136, EGFR>60  05/26/2017 Creatinine0.86, BUN18, Potassium3.7, Sodium137, EGFR>60  04/19/2017 Creatinine0.92, BUN21, Potassium4.6, [id number], [id number] 02/23/2017 Creatinine 0.91, BUN 15, Potassium 4.0, Sodium 140, EGFR >60 02/18/2017 Creatinine 1.00, BUN 17, Potassium 4.5, Sodium 138, EGFR 69-80  01/20/2017 Creatinine 0.94, BUN 16, Potassium 3.8, Sodium 138, EGFR 75-86  01/19/2017 Creatinine 0.87, BUN 18, Potassium 4.7, Sodium 140  Recommendations: Left voice mail with ICM number and encouraged to call if experiencing any fluid symptoms.  Follow-up plan: ICM clinic phone appointment on 05/04/2018.    Copy of ICM check sent to Dr. [name].   3 month ICM trend: 04/04/2018    1 Year ICM trend:       Rosalene Billings, RN 04/04/2018 12:11 PM

## 2018-04-04 NOTE — Telephone Encounter (Signed)
Remote ICM transmission received.  Attempted call to patient and left detailed message per DPR regarding transmission and next ICM scheduled for 05/04/2018.  Advised to return call for any fluid symptoms or questions.    

## 2018-04-10 ENCOUNTER — Encounter: Payer: Self-pay | Admitting: Internal Medicine

## 2018-04-10 ENCOUNTER — Telehealth: Payer: Self-pay | Admitting: Internal Medicine

## 2018-04-10 ENCOUNTER — Ambulatory Visit: Payer: PPO | Admitting: Internal Medicine

## 2018-04-10 VITALS — BP 122/80 | HR 82 | Ht 67.0 in | Wt 209.2 lb

## 2018-04-10 DIAGNOSIS — J683 Other acute and subacute respiratory conditions due to chemicals, gases, fumes and vapors: Secondary | ICD-10-CM

## 2018-04-10 DIAGNOSIS — I429 Cardiomyopathy, unspecified: Secondary | ICD-10-CM

## 2018-04-10 DIAGNOSIS — R0609 Other forms of dyspnea: Secondary | ICD-10-CM | POA: Diagnosis not present
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
## 2018-04-10 NOTE — Telephone Encounter (Signed)
Hi Cody Gomez  is patient of yours that I want to refer to cardiac rehab for dyspnea and IHD. He wanted to make sure was ok with you? And no contraindications?  Thanks  Dr. Brand Males, M.D., [facility].C.P Pulmonary and Critical Care Medicine Staff Physician, [place] Director - Interstitial Lung Disease  Program  Pulmonary [place] at [place], [place], [address]  Pager: [phone], If no answer or between  15:00h - 7:00h: call [phone] Telephone: [phone]

## 2018-04-10 NOTE — Patient Instructions (Addendum)
Reactive airways dysfunction syndrome, moderate persistent, uncomplicated (HCC)  - stable and improved ater starting breo last visit - continue breo daily scheduled  - use albuterol as needed  Dyspnea on exertion Cardiomyopathy, unspecified type ([place])  -glad shortness of breath better after thyroid surgery June 2018, Defib implantation Aug 2018 and [facility] Jan 2019  - residual is due to physical deconditioning - refer Cardiac rehab at [facility] (have written to Dr [name] to make sure is ok)  Followup  - flu shot in fall  - return  Sooner if needed - fu in 6 months

## 2018-04-13 ENCOUNTER — Other Ambulatory Visit: Payer: Self-pay | Admitting: Internal Medicine

## 2018-04-13 NOTE — Telephone Encounter (Signed)
None htanks

## 2018-04-14 NOTE — Telephone Encounter (Signed)
Cody Gomez: thanks a lot  Cody Gomez: let patient know and make referral   Closing message. Reply not needed   Dr. Brand Males, M.D., [facility].C.P Pulmonary and Critical Care Medicine Staff Physician, [place] Director - Interstitial Lung Disease  Program  Pulmonary [place] at [place], [place], [address]  Pager: [phone], If no answer or between  15:00h - 7:00h: call [phone] Telephone: [phone]

## 2018-04-14 NOTE — Telephone Encounter (Signed)
An order was placed for pt at the [facility] with MR 4/29 to begin cardiac rehab in [place] due to this being close to pt's residence .  Nothing further needed.

## 2018-04-24 ENCOUNTER — Other Ambulatory Visit: Payer: Self-pay | Admitting: Internal Medicine

## 2018-05-02 DIAGNOSIS — Z7901 Long term (current) use of anticoagulants: Secondary | ICD-10-CM | POA: Diagnosis not present

## 2018-05-02 DIAGNOSIS — R0600 Dyspnea, unspecified: Secondary | ICD-10-CM | POA: Diagnosis not present

## 2018-05-02 DIAGNOSIS — I429 Cardiomyopathy, unspecified: Secondary | ICD-10-CM | POA: Diagnosis not present

## 2018-05-02 DIAGNOSIS — J449 Chronic obstructive pulmonary disease, unspecified: Secondary | ICD-10-CM | POA: Diagnosis not present

## 2018-05-04 ENCOUNTER — Ambulatory Visit (INDEPENDENT_AMBULATORY_CARE_PROVIDER_SITE_OTHER): Payer: PPO

## 2018-05-04 DIAGNOSIS — I5022 Chronic systolic (congestive) heart failure: Secondary | ICD-10-CM | POA: Diagnosis not present

## 2018-05-04 DIAGNOSIS — Z95810 Presence of automatic (implantable) cardiac defibrillator: Secondary | ICD-10-CM

## 2018-05-05 NOTE — Progress Notes (Signed)
EPIC Encounter for ICM Monitoring  Patient Name: Cody Gomez is a 84 y.o. male Date: 05/05/2018 Primary Care Physican: Street, Sharon Mt, MD Primary [place] Electrophysiologist: Faustino Congress Weight: 207lbs BiV Pacing: 99.2%      Heart Failure questions reviewed, pt asymptomatic.   Thoracic impedance normal.  Prescribed dosage: Furosemide 40 mg every day. Potassium 20 mEqtake 1 tablet by mouth twice daily.  Labs: 03/03/2018 Creatinine 0.90, BUN 16, Potassium 3.9, Sodium 139, EGFR 78-90 10/31/2017 Creatinine 0.89, BUN 16, Potassium 4.4, Sodium 141, EGFR 79-91 07/22/2017 Creatinine1.00, BUN21, Potassium4.1, Sodium141, [id number] 05/27/2017 Creatinine0.92, BUN13, Potassium3.7, Sodium136, EGFR>60  05/26/2017 Creatinine0.86, BUN18, Potassium3.7, Sodium137, EGFR>60  04/19/2017 Creatinine0.92, BUN21, Potassium4.6, [id number], [id number] 02/23/2017 Creatinine 0.91, BUN 15, Potassium 4.0, Sodium 140, EGFR >60 02/18/2017 Creatinine 1.00, BUN 17, Potassium 4.5, Sodium 138, EGFR 69-80  01/20/2017 Creatinine 0.94, BUN 16, Potassium 3.8, Sodium 138, EGFR 75-86  01/19/2017 Creatinine 0.87, BUN 18, Potassium 4.7, Sodium 140  Recommendations: No changes.   Encouraged to call for fluid symptoms.  Follow-up plan: ICM clinic phone appointment on 06/05/2018.   Recall: 08/30/2018 for Dr [name] visit  Copy of ICM check sent to Dr. [name].   3 month ICM trend: 05/04/2018    1 Year ICM trend:       Rosalene Billings, RN 05/05/2018 9:31 AM

## 2018-05-09 DIAGNOSIS — Z7901 Long term (current) use of anticoagulants: Secondary | ICD-10-CM | POA: Diagnosis not present

## 2018-05-15 DIAGNOSIS — R0601 Orthopnea: Secondary | ICD-10-CM | POA: Diagnosis not present

## 2018-05-15 DIAGNOSIS — I429 Cardiomyopathy, unspecified: Secondary | ICD-10-CM | POA: Diagnosis not present

## 2018-05-15 DIAGNOSIS — J449 Chronic obstructive pulmonary disease, unspecified: Secondary | ICD-10-CM | POA: Diagnosis not present

## 2018-05-19 DIAGNOSIS — Z7901 Long term (current) use of anticoagulants: Secondary | ICD-10-CM | POA: Diagnosis not present

## 2018-05-26 DIAGNOSIS — Z7901 Long term (current) use of anticoagulants: Secondary | ICD-10-CM | POA: Diagnosis not present

## 2018-06-04 ENCOUNTER — Encounter: Payer: Self-pay | Admitting: Internal Medicine

## 2018-06-05 ENCOUNTER — Ambulatory Visit (INDEPENDENT_AMBULATORY_CARE_PROVIDER_SITE_OTHER): Payer: PPO

## 2018-06-05 DIAGNOSIS — I5022 Chronic systolic (congestive) heart failure: Secondary | ICD-10-CM

## 2018-06-05 DIAGNOSIS — Z95810 Presence of automatic (implantable) cardiac defibrillator: Secondary | ICD-10-CM

## 2018-06-05 NOTE — Progress Notes (Signed)
EPIC Encounter for ICM Monitoring  Patient Name: Cody Gomez is a 84 y.o. male Date: 06/05/2018 Primary Care Physican: Street, Sharon Mt, MD Primary [place] Electrophysiologist: Faustino Congress Weight: 208lbs BiV Pacing: 99.1%      Heart Failure questions reviewed, pt asymptomatic.  Patient is not taking Furosemide on days he has therapy which is about 3 days a week and he is eating in restaurants most of the time.  Advised he needs to take Furosemide daily.     Thoracic impedance abnormal suggesting fluid accumulation starting 05/24/2018.  Prescribed dosage: Furosemide 40 mg every day. Potassium 20 mEqtake 1 tablet by mouth twice daily.  Labs: 03/03/2018 Creatinine 0.90, BUN 16, Potassium 3.9, Sodium 139, EGFR 78-90 10/31/2017 Creatinine 0.89, BUN 16, Potassium 4.4, Sodium 141, EGFR 79-91 07/22/2017 Creatinine1.00, BUN21, Potassium4.1, Sodium141, [id number] 05/27/2017 Creatinine0.92, BUN13, Potassium3.7, Sodium136, EGFR>60  05/26/2017 Creatinine0.86, BUN18, Potassium3.7, Sodium137, EGFR>60  04/19/2017 Creatinine0.92, BUN21, Potassium4.6, [id number], [id number] 02/23/2017 Creatinine 0.91, BUN 15, Potassium 4.0, Sodium 140, EGFR >60 02/18/2017 Creatinine 1.00, BUN 17, Potassium 4.5, Sodium 138, EGFR 69-80  01/20/2017 Creatinine 0.94, BUN 16, Potassium 3.8, Sodium 138, EGFR 75-86  01/19/2017 Creatinine 0.87, BUN 18, Potassium 4.7, Sodium 140  Recommendations:  Advised to take Furosemide daily as prescribed and decrease salt intake.   Encouraged to call for fluid symptoms.  Follow-up plan: ICM clinic phone appointment on 06/13/2018 to recheck fluid levels.  Office appointment scheduled 08/31/2018 with Dr. [name].   Copy of ICM check sent to Dr. [name].   3 month ICM trend: 06/05/2018    1 Year ICM trend:       Rosalene Billings, RN 06/05/2018 11:25 AM

## 2018-06-12 DIAGNOSIS — J449 Chronic obstructive pulmonary disease, unspecified: Secondary | ICD-10-CM | POA: Diagnosis not present

## 2018-06-13 ENCOUNTER — Ambulatory Visit (INDEPENDENT_AMBULATORY_CARE_PROVIDER_SITE_OTHER): Payer: PPO

## 2018-06-13 ENCOUNTER — Telehealth: Payer: Self-pay

## 2018-06-13 DIAGNOSIS — Z95810 Presence of automatic (implantable) cardiac defibrillator: Secondary | ICD-10-CM

## 2018-06-13 DIAGNOSIS — I5022 Chronic systolic (congestive) heart failure: Secondary | ICD-10-CM

## 2018-06-13 NOTE — Progress Notes (Signed)
EPIC Encounter for ICM Monitoring  Patient Name: Cody Gomez is a 84 y.o. male Date: 06/13/2018 Primary Care Physican: Street, Sharon Mt, MD Primary [place] Electrophysiologist: Faustino Congress Weight: 208lbs BiV Pacing: 99.7%        Heart Failure questions reviewed, pt asymptomatic.  He reported he has not skipped any Furosemide dosages and taking the dosage after he gets home from therapy.  He is still eating in restaurants most of the time. Advised to try and decrease salt intake.     Thoracic impedance started reaccumulating 06/08/2018 suggesting fluid accumulation since 06/08/2018.  Impedance returned to normal after last remote transmission 06/05/2018 when he resumed taking Furosemide as prescribed.    Prescribed dosage: Furosemide 40 mg every day. Potassium 20 mEqtake 1 tablet by mouth twice daily.  Labs:   03/03/2018 Creatinine 0.90, BUN 16, Potassium 3.9, Sodium 139, EGFR 78-90 10/31/2017 Creatinine 0.89, BUN 16, Potassium 4.4, Sodium 141, EGFR 79-91 07/22/2017 Creatinine1.00, BUN21, Potassium4.1, Sodium141, [id number] 05/27/2017 Creatinine0.92, BUN13, Potassium3.7, Sodium136, EGFR>60  05/26/2017 Creatinine0.86, BUN18, Potassium3.7, Sodium137, EGFR>60  04/19/2017 Creatinine0.92, BUN21, Potassium4.6, [id number], [id number] 02/23/2017 Creatinine 0.91, BUN 15, Potassium 4.0, Sodium 140, EGFR >60 02/18/2017 Creatinine 1.00, BUN 17, Potassium 4.5, Sodium 138, EGFR 69-80  01/20/2017 Creatinine 0.94, BUN 16, Potassium 3.8, Sodium 138, EGFR 75-86  01/19/2017 Creatinine 0.87, BUN 18, Potassium 4.7, Sodium 140  Recommendations:  Reviewed with Dr [name] in the office and no changes today but continue to monitor.  Advised patient will recheck fluid levels on 06/26/2018 and call if he develops any fluid symptoms.   Follow-up plan: ICM clinic phone appointment on 06/26/2018 to recheck fluid levels.  Office appointment scheduled 08/31/2018 with Dr. [name].      3 month ICM trend: 06/13/2018    1 Year ICM trend:       Rosalene Billings, RN 06/13/2018 9:06 AM

## 2018-06-13 NOTE — Telephone Encounter (Signed)
Remote ICM transmission received.  Attempted call to patient and left detailed message to return call regarding transmission.   

## 2018-06-22 ENCOUNTER — Other Ambulatory Visit: Payer: Self-pay | Admitting: Internal Medicine

## 2018-06-22 DIAGNOSIS — Z7901 Long term (current) use of anticoagulants: Secondary | ICD-10-CM | POA: Diagnosis not present

## 2018-06-22 DIAGNOSIS — I428 Other cardiomyopathies: Secondary | ICD-10-CM

## 2018-06-26 ENCOUNTER — Ambulatory Visit (INDEPENDENT_AMBULATORY_CARE_PROVIDER_SITE_OTHER): Payer: PPO

## 2018-06-26 DIAGNOSIS — I5022 Chronic systolic (congestive) heart failure: Secondary | ICD-10-CM

## 2018-06-26 DIAGNOSIS — Z95810 Presence of automatic (implantable) cardiac defibrillator: Secondary | ICD-10-CM

## 2018-06-26 NOTE — Progress Notes (Signed)
EPIC Encounter for ICM Monitoring  Patient Name: Cody Gomez is a 84 y.o. male Date: 06/26/2018 Primary Care Physican: Street, Christopher M, MD Primary Cardiologist:Klein Electrophysiologist: Klein Dry Weight: 208lbs BiV Pacing: 99.7%        Attempted call to patient and unable to reach.   Transmission reviewed.    Thoracic impedance returned to normal since last ICM transmission on 06/13/2018.   Prescribed dosage: Furosemide 40 mg every day. Potassium 20 mEqtake 1 tablet by mouth twice daily.  Labs:   03/03/2018 Creatinine 0.90, BUN 16, Potassium 3.9, Sodium 139, EGFR 78-90 10/31/2017 Creatinine 0.89, BUN 16, Potassium 4.4, Sodium 141, EGFR 79-91 07/22/2017 Creatinine1.00, BUN21, Potassium4.1, Sodium141, EGFR69-80 05/27/2017 Creatinine0.92, BUN13, Potassium3.7, Sodium136, EGFR>60  05/26/2017 Creatinine0.86, BUN18, Potassium3.7, Sodium137, EGFR>60  04/19/2017 Creatinine0.92, BUN21, Potassium4.6, Sodium141, EGFR77-89 02/23/2017 Creatinine 0.91, BUN 15, Potassium 4.0, Sodium 140, EGFR >60 02/18/2017 Creatinine 1.00, BUN 17, Potassium 4.5, Sodium 138, EGFR 69-80  01/20/2017 Creatinine 0.94, BUN 16, Potassium 3.8, Sodium 138, EGFR 75-86  01/19/2017 Creatinine 0.87, BUN 18, Potassium 4.7, Sodium 140  Recommendations: NONE - Unable to reach.  Follow-up plan: ICM clinic phone appointment on 07/17/2018.    Copy of ICM check sent to Dr. Klein.   3 month ICM trend: 06/26/2018    1 Year ICM trend:       Laurie S Short, RN 06/26/2018 3:11 PM   

## 2018-06-27 ENCOUNTER — Telehealth: Payer: Self-pay

## 2018-06-27 NOTE — Telephone Encounter (Signed)
Remote ICM transmission received.  Attempted call to patient and no answer or answering machine.  

## 2018-06-30 NOTE — Progress Notes (Signed)
Received call back from patient.  He stated he is doing fine and denied any fluid symptoms.  Transmission reviewed.  Encouraged to call for any fluid symptoms. No changes today.  Next ICM remote transmission 07/17/2018.

## 2018-07-03 DIAGNOSIS — Z0000 Encounter for general adult medical examination without abnormal findings: Secondary | ICD-10-CM | POA: Diagnosis not present

## 2018-07-03 DIAGNOSIS — Z7901 Long term (current) use of anticoagulants: Secondary | ICD-10-CM | POA: Diagnosis not present

## 2018-07-14 DIAGNOSIS — J449 Chronic obstructive pulmonary disease, unspecified: Secondary | ICD-10-CM | POA: Diagnosis not present

## 2018-07-17 ENCOUNTER — Telehealth: Payer: Self-pay

## 2018-07-17 ENCOUNTER — Ambulatory Visit (INDEPENDENT_AMBULATORY_CARE_PROVIDER_SITE_OTHER): Payer: PPO

## 2018-07-17 DIAGNOSIS — Z7901 Long term (current) use of anticoagulants: Secondary | ICD-10-CM | POA: Diagnosis not present

## 2018-07-17 DIAGNOSIS — Z95810 Presence of automatic (implantable) cardiac defibrillator: Secondary | ICD-10-CM

## 2018-07-17 DIAGNOSIS — I5022 Chronic systolic (congestive) heart failure: Secondary | ICD-10-CM

## 2018-07-17 NOTE — Progress Notes (Signed)
EPIC Encounter for ICM Monitoring  Patient Name: Cody Gomez is a 84 y.o. male Date: 07/17/2018 Primary Care Physican: Street, Sharon Mt, MD Primary [place] Electrophysiologist: [name] Dry Weight: Previous weight 208lbs BiV Pacing: 99.0%  Clinical Status (26-Jun-2018 to 17-Jul-2018)  Treated VT/VF 2 episodes   AT/AF 0 episodes   Time in AT/AF 0.0 hr/day (0.0%)  OBSERVATIONS (5)   V. undersensing possible: RV Sensitivity > 0.6 mV.   Patient Activity less than 1 hr/day for 1 weeks.   At least 1 therapy failed in 1 VT/VF episodes.  2 treated VT/VF episodes longer than 30 sec.   Last VT Therapy set On is not a full energy CV.  Attempted call to patient and unable to reach.  Left detailed message, per DPR, regarding transmission.  Transmission reviewed.  VT addressed by device clinic triage RN.    Thoracic impedance abnormal suggesting fluid accumulation starting 07/09/2018.  Prescribed dosage: Furosemide 40 mg every day. Potassium 20 mEqtake 1 tablet by mouth twice daily.  Labs:  03/03/2018 Creatinine 0.90, BUN 16, Potassium 3.9, Sodium 139, EGFR 78-90 10/31/2017 Creatinine 0.89, BUN 16, Potassium 4.4, Sodium 141, EGFR 79-91 07/22/2017 Creatinine1.00, BUN21, Potassium4.1, Sodium141, [id number] 05/27/2017 Creatinine0.92, BUN13, Potassium3.7, Sodium136, EGFR>60  05/26/2017 Creatinine0.86, BUN18, Potassium3.7, Sodium137, EGFR>60  04/19/2017 Creatinine0.92, BUN21, Potassium4.6, [id number], [id number] 02/23/2017 Creatinine 0.91, BUN 15, Potassium 4.0, Sodium 140, EGFR >60 02/18/2017 Creatinine 1.00, BUN 17, Potassium 4.5, Sodium 138, EGFR 69-80  01/20/2017 Creatinine 0.94, BUN 16, Potassium 3.8, Sodium 138, EGFR 75-86  01/19/2017 Creatinine 0.87, BUN 18, Potassium 4.7, Sodium 140  Recommendations: NONE - Unable to reach.  Follow-up plan: ICM clinic phone appointment on 08/03/2018 to recheck fluid levels.   Office appointment scheduled  08/31/2018 with Dr. [name].    Copy of ICM check sent to Dr. [name].   3 month ICM trend: 07/17/2018    1 Year ICM trend:       Rosalene Billings, RN 07/17/2018 12:38 PM

## 2018-07-17 NOTE — Telephone Encounter (Signed)
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 Pt voiced understanding of driving restrictions x 6 months. Informed pt that I would review with Dr. Rayann Heman and call pt back with any recommendations pt voiced understanding.

## 2018-07-26 ENCOUNTER — Other Ambulatory Visit: Payer: Self-pay | Admitting: Internal Medicine

## 2018-07-27 ENCOUNTER — Other Ambulatory Visit: Payer: Self-pay | Admitting: Pharmacist

## 2018-07-27 ENCOUNTER — Other Ambulatory Visit: Payer: Self-pay | Admitting: *Deleted

## 2018-07-27 NOTE — Patient Outreach (Signed)
[facility]) Care Management  07/27/2018  Cody Gomez Nov 13, 1931 [id number]  [facility] CM HTA/ HRA Screening Call   PCP: Dr. Christa See [facility]) Cardiologist: Dr. [name]   Successful telephone outreach to Cody Gomez for HRA screening; patient has history including but not limited to, HTN/ HLD; COPD; CAD with ICD.  HIPAA/ identity verified during phone call today, and purpose of call was discussed with patient.  Patient reports he "doing just fine" today and he sounds to be in no apparent distress throughout entirety of 30 minute phone call today.  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    Patient reports today: -- attended last PCP appointment "spring of this year: has next office visit with PCP "scheduled next week for routine check in" -- upcoming cardiology provider office visit "this fall;" patient states he is not sure exactly when this appointment is, as he is not near his calendar. -- denies need for transportation services, stating that his son and daughter-in-law transport him to all provider appointments and that he still drives self to errands/ and short distances close to his home; reports supportive family members/ local family and church network that are able to assist with care needs as indicated -- independently manages self-care for ADL's and IADL's -- no recent falls; uses cane occasionally; has walker, but does not need to use -- verbalizes a good understanding of his current medications; takes 8 prescription medications and reports that he "could use all the help" he can get with affording medications, specifically, with affording Breo inhaler.  Self-manages medications, takes prescriptions directly out of prescription bottles; patient agreeable to talking with [facility] pharmacist around possible medication assistance with his medications, but most specifically, with Breo inhaler. -- patient unsure whether he has HCPOA/  living will; states that his son would make "all decisions for him;" discussed basics of creating of HCPOA/ Living Will and assured patient that he does not need to hire an attorney to have these completed; patient agreeable to discuss Advance Directive planning with [facility] CSW. -- declines need for further assistance around his care; questioned patient about his answer on HTA- HRA screening tool, indicating that he has memory loss; patient clarified today that he has never been diagnosed with dementia, and stated "now that I am getting older, I just don't remember stuff as good as I used to."  Patien denies that this is a problem/ concern today.  Discussed [facility] CM services available to patient through his insurance provider; patient agreeable to my placing [facility] CM packet/ nurse advice line magnet, and to have [place] and SW team contact him, as indicated above.   Encouraged patient to contact [facility] CM in the future if needs arise, and patient verbalizes agreement.   Plan:   Will place [facility] CM welcome packet in mail to patient    Will place referral to[facility] BSW re: patient's request  for additional education around Advanced Directive planning   Will place [place] referral around patient's stated need for possible resources available for medication assistance  Oneta Rack, RN, BSN, [employer] Coordinator [facility] Care Management  [phone]

## 2018-07-27 NOTE — Patient Outreach (Signed)
[facility]) Care Management  07/27/2018  Cody Gomez 1931-01-04 [id number]   84 year old male referred to [place] Management by [employer] for medication assistance with [facility] inhaler.    HTA TROOP:  $1001.33 as of 07/27/2018  Successful call to Cody Gomez today. HIPAA identifiers verified. Patient is not interested in reviewing medications at this time.  He reports he is paying $87/month for Breo inhaler and that Cody Gomez is also expensive.   Medication Assistance: Extra Help:  Patient eligible based on reported income.  We reviewed program and benefits.  Patient requests time to discuss with his son on applying online vs going in person to [facility].   PAPs: Breo Ellipta PAP ([place]): Requires $600 TROOP. Patient qualifies for [facility] [place] based on TROOP and income. Patient agreeable to apply.    Tikosyn PAP [employer]): Qualifies based on income.  Patient agreeable to apply.   Plan: I will route patient assistance letter to pharmacy technician, Etter Sjogren, who will coordinate application process for Tikosyn and Breo.  She will assist with obtaining all pertinent documents from both patient and Dr. Virl Axe and submit application once completed.    I will follow-up with patient next week regarding LIS decision.    Ralene Bathe, PharmD, [place] [phone]

## 2018-07-28 ENCOUNTER — Other Ambulatory Visit: Payer: Self-pay | Admitting: Pharmacy Technician

## 2018-07-28 NOTE — Patient Outreach (Signed)
[facility]) Care Management  07/28/2018  Cody Gomez 18-May-1931 [id number]   Received [facility] patient assistance referral from [place] for [employer] and Tikosyn. Prepared patient portions to be mailed and faxed provider portion to Dr. [name] (Tikosyn) and Dr. Chase Caller [facility]).  Will follow up with patient in 5-7 business days to confirm applications have been received.  Maud Deed [place], [place] Management [phone]

## 2018-07-31 ENCOUNTER — Telehealth: Payer: Self-pay

## 2018-07-31 ENCOUNTER — Other Ambulatory Visit: Payer: Self-pay | Admitting: Pharmacist

## 2018-07-31 NOTE — Patient Outreach (Signed)
[facility]) Care Management  07/31/2018  Cody Gomez 02/18/1931 [id number]   Unsuccessful outreach to [name] today regarding medication assistance.   I left a HIPAA compliant voicemail on the home phone and spoke with a male on the mobile phone requesting a return call.    Plan: I will reach out to patient again later this week to follow-up on Extra Help application.   Ralene Bathe, PharmD, [place] [phone]

## 2018-07-31 NOTE — Telephone Encounter (Signed)
**Note De-Identified Kepler Mccabe Obfuscation** I received a fax from Etter Sjogren, CPhT with [facility] asking me to complete the provider part of a [employer] Pt Asst application for Tikosyn, have Dr [name] sign it and to fax it back to her.  I have completed the application, Dr [name] has signed and I faxed it back to [place] at [phone].

## 2018-08-01 ENCOUNTER — Other Ambulatory Visit: Payer: Self-pay | Admitting: *Deleted

## 2018-08-01 ENCOUNTER — Telehealth: Payer: Self-pay | Admitting: Internal Medicine

## 2018-08-01 DIAGNOSIS — E890 Postprocedural hypothyroidism: Secondary | ICD-10-CM | POA: Diagnosis not present

## 2018-08-01 DIAGNOSIS — I420 Dilated cardiomyopathy: Secondary | ICD-10-CM | POA: Diagnosis not present

## 2018-08-01 DIAGNOSIS — I482 Chronic atrial fibrillation: Secondary | ICD-10-CM | POA: Diagnosis not present

## 2018-08-01 DIAGNOSIS — Z6831 Body mass index (BMI) 31.0-31.9, adult: Secondary | ICD-10-CM | POA: Diagnosis not present

## 2018-08-01 DIAGNOSIS — Z1339 Encounter for screening examination for other mental health and behavioral disorders: Secondary | ICD-10-CM | POA: Diagnosis not present

## 2018-08-01 DIAGNOSIS — Z7901 Long term (current) use of anticoagulants: Secondary | ICD-10-CM | POA: Diagnosis not present

## 2018-08-01 DIAGNOSIS — Z9181 History of falling: Secondary | ICD-10-CM | POA: Diagnosis not present

## 2018-08-01 MED ORDER — FLUTICASONE FUROATE-VILANTEROL 200-25 MCG/INH IN AEPB: 1.0000 | INHALATION_SPRAY | Freq: Every day | RESPIRATORY_TRACT | 11 refills | Status: AC

## 2018-08-01 NOTE — Telephone Encounter (Signed)
Called and spoke with pt in regards to receiving the urgent request for the patient assistance application for the [facility] so I could go ahead and get the needed information on the application.  While filling out the application, pt stated to me at this time he was unsure if he wanted to go ahead and go through with the patient assistance due to having close family that is able to help him out whenever needed and due to that, he does not want to go through with the application at this time. I stated to pt I would call [facility] to state this to them. Pt expressed understanding and thanked me for this.  Attempted to call Caryl Pina with [place] but unable to reach her. Left a message with Etter Sjogren to return call x1.

## 2018-08-03 ENCOUNTER — Ambulatory Visit (INDEPENDENT_AMBULATORY_CARE_PROVIDER_SITE_OTHER): Payer: PPO

## 2018-08-03 DIAGNOSIS — I5022 Chronic systolic (congestive) heart failure: Secondary | ICD-10-CM

## 2018-08-03 DIAGNOSIS — Z95810 Presence of automatic (implantable) cardiac defibrillator: Secondary | ICD-10-CM

## 2018-08-03 NOTE — Progress Notes (Signed)
EPIC Encounter for ICM Monitoring  Patient Name: Cody Gomez is a 84 y.o. male Date: 08/03/2018 Primary Care Physican: Street, Sharon Mt, MD Primary [place] Electrophysiologist: Faustino Congress Weight: 203lbs BiV Pacing: 99.2%      Heart Failure questions reviewed, pt asymptomatic.  He has lost weight recently.    Thoracic impedance abnormal suggesting fluid accumulation starting 07/19/2018.  Prescribed dosage: Furosemide 40 mg every day. Potassium 20 mEqtake 1 tablet by mouth twice daily.  Labs:  03/03/2018 Creatinine 0.90, BUN 16, Potassium 3.9, Sodium 139, EGFR 78-90 10/31/2017 Creatinine 0.89, BUN 16, Potassium 4.4, Sodium 141, EGFR 79-91 07/22/2017 Creatinine1.00, BUN21, Potassium4.1, Sodium141, [id number] 05/27/2017 Creatinine0.92, BUN13, Potassium3.7, Sodium136, EGFR>60  05/26/2017 Creatinine0.86, BUN18, Potassium3.7, Sodium137, EGFR>60  04/19/2017 Creatinine0.92, BUN21, Potassium4.6, [id number], [id number] 02/23/2017 Creatinine 0.91, BUN 15, Potassium 4.0, Sodium 140, EGFR >60 02/18/2017 Creatinine 1.00, BUN 17, Potassium 4.5, Sodium 138, EGFR 69-80  01/20/2017 Creatinine 0.94, BUN 16, Potassium 3.8, Sodium 138, EGFR 75-86  01/19/2017 Creatinine 0.87, BUN 18, Potassium 4.7, Sodium 140  Recommendations:  Advised to take 1 extra tablet of Furosemide 40 mg and he said he will do that over the weekend because he will not be home tomorrow.  .  Follow-up plan: ICM clinic phone appointment on 08/08/2018 (manual send) to recheck fluid levels.   Office appointment scheduled 08/31/2018 with Dr. [name].    Copy of ICM check sent to Dr. [name].   3 month ICM trend: 08/03/2018    1 Year ICM trend:       Rosalene Billings, RN 08/03/2018 11:24 AM

## 2018-08-04 ENCOUNTER — Other Ambulatory Visit: Payer: Self-pay | Admitting: Pharmacist

## 2018-08-04 NOTE — Patient Outreach (Signed)
[facility]) Care Management  08/04/2018  APOLO CUTSHAW [birth year] [id number]   83 year old male engaged with [place] for medication assistance.    Successful outreach call to Mr. Cowles today.  HIPAA identifiers verified. Patient reports he is "not sure" about applying for patient assistance.  We reviewed patient assistance programs and that if approved, patient would receive medication at no cost through 2019.  Patient then reported he was interested in applying.  He states he has received the applications and will work on completing them.  He is aware to return in envelope provided by [facility].  Patient states he is "still thinking about" the Extra Help program.  His son is away right now on vacation so he has not had time to discuss with him about going to the social security office.   I encouraged him to do so when able or apply online.  Patient voiced understanding.   Plan: I will route note to [place] technician to follow-up with patient per workflow on medication assistance applications.   Ralene Bathe, PharmD, [place] [phone]

## 2018-08-08 ENCOUNTER — Telehealth: Payer: Self-pay

## 2018-08-08 ENCOUNTER — Ambulatory Visit (INDEPENDENT_AMBULATORY_CARE_PROVIDER_SITE_OTHER): Payer: PPO

## 2018-08-08 DIAGNOSIS — I5022 Chronic systolic (congestive) heart failure: Secondary | ICD-10-CM

## 2018-08-08 DIAGNOSIS — Z95810 Presence of automatic (implantable) cardiac defibrillator: Secondary | ICD-10-CM

## 2018-08-08 NOTE — Progress Notes (Signed)
EPIC Encounter for ICM Monitoring  Patient Name: Cody Gomez is a 84 y.o. male Date: 08/08/2018 Primary Care Physican: Street, Sharon Mt, MD Primary [place] Electrophysiologist: Faustino Congress Weight: 204lbs BiV Pacing: 98.42%            Heart Failure questions reviewed, pt asymptomatic.  He took extra Furosemide yesterday but fluid started re-accumulating 8/23.   Thoracic impedance abnormal suggesting fluid accumulation  Prescribed dosage: Furosemide 40 mg every day. Potassium 20 mEqtake 1 tablet by mouth twice daily.  Labs:  03/03/2018 Creatinine 0.90, BUN 16, Potassium 3.9, Sodium 139, EGFR 78-90 10/31/2017 Creatinine 0.89, BUN 16, Potassium 4.4, Sodium 141, EGFR 79-91 07/22/2017 Creatinine1.00, BUN21, Potassium4.1, Sodium141, [id number] 05/27/2017 Creatinine0.92, BUN13, Potassium3.7, Sodium136, EGFR>60  05/26/2017 Creatinine0.86, BUN18, Potassium3.7, Sodium137, EGFR>60  04/19/2017 Creatinine0.92, BUN21, Potassium4.6, [id number], [id number] 02/23/2017 Creatinine 0.91, BUN 15, Potassium 4.0, Sodium 140, EGFR >60 02/18/2017 Creatinine 1.00, BUN 17, Potassium 4.5, Sodium 138, EGFR 69-80  01/20/2017 Creatinine 0.94, BUN 16, Potassium 3.8, Sodium 138, EGFR 75-86  01/19/2017 Creatinine 0.87, BUN 18, Potassium 4.7, Sodium 140  Recommendations:  Advised to take extra Furosemide 40 mg 1 tablet today and tomorrow and 1 extra Potassium both days.  He took extra Furosemide yesterday.  Follow-up plan: ICM clinic phone appointment on 08/21/2018.   Office appointment scheduled 08/31/2018 with Dr. [name].    Copy of ICM check sent to Dr. [name].   3 month ICM trend: 08/08/2018    1 Year ICM trend:       Rosalene Billings, RN 08/08/2018 9:24 AM

## 2018-08-08 NOTE — Telephone Encounter (Signed)
Spoke with pt and reminded pt of remote transmission that is due today. Pt verbalized understanding.   

## 2018-08-10 DIAGNOSIS — Z7901 Long term (current) use of anticoagulants: Secondary | ICD-10-CM | POA: Diagnosis not present

## 2018-08-16 ENCOUNTER — Other Ambulatory Visit: Payer: Self-pay | Admitting: Internal Medicine

## 2018-08-21 ENCOUNTER — Ambulatory Visit (INDEPENDENT_AMBULATORY_CARE_PROVIDER_SITE_OTHER): Payer: PPO

## 2018-08-21 ENCOUNTER — Ambulatory Visit (INDEPENDENT_AMBULATORY_CARE_PROVIDER_SITE_OTHER): Payer: PPO | Admitting: *Deleted

## 2018-08-21 DIAGNOSIS — I472 Ventricular tachycardia, unspecified: Secondary | ICD-10-CM

## 2018-08-21 DIAGNOSIS — I5022 Chronic systolic (congestive) heart failure: Secondary | ICD-10-CM | POA: Diagnosis not present

## 2018-08-21 DIAGNOSIS — Z95810 Presence of automatic (implantable) cardiac defibrillator: Secondary | ICD-10-CM

## 2018-08-21 NOTE — Progress Notes (Signed)
Remote ICD transmission.   

## 2018-08-22 ENCOUNTER — Encounter: Payer: Self-pay | Admitting: Cardiology

## 2018-08-22 NOTE — Progress Notes (Signed)
EPIC Encounter for ICM Monitoring  Patient Name: ROHAAN DURNIL is a 84 y.o. male Date: 08/22/2018 Primary Care Physican: Street, Sharon Mt, MD Primary [place] Electrophysiologist: [name] Dry Weight:202lbs BiV Pacing: 98.42%       Heart Failure questions reviewed, pt asymptomatic.   Thoracic impedance close to baseline normal.  Prescribed dosage: Furosemide 40 mg every day. Potassium 20 mEqtake 1 tablet by mouth twice daily.  Labs:  03/03/2018 Creatinine 0.90, BUN 16, Potassium 3.9, Sodium 139, EGFR 78-90  Recommendations: No changes.   Encouraged to call for fluid symptoms.  Follow-up plan: ICM clinic phone appointment on 10/02/2018.  Office appointment scheduled 08/31/2018 with Dr. [name].     Copy of ICM check sent to Dr. [name].   3 month ICM trend: 08/21/2018    1 Year ICM trend:       Rosalene Billings, RN 08/22/2018 10:29 AM

## 2018-08-23 ENCOUNTER — Other Ambulatory Visit: Payer: Self-pay

## 2018-08-23 NOTE — Patient Outreach (Signed)
[facility]) Care Management  08/23/2018  Cody Gomez [birth year] [id number]  BSW attempted to contact the patient on today's date to conduct a community resource consult and assist with the completion of an advance directive. Unfortunately, today's call was unsuccessful. BSW left the patient a HIPAA compliant voice message requesting a return call.   Plan: BSW will mail the patient an unsuccessful outreach letter. BSW will attempt the patient again within the next four business days.  Daneen Schick, BSW, CDP [facility] [phone]

## 2018-08-28 ENCOUNTER — Ambulatory Visit: Payer: Self-pay

## 2018-08-28 ENCOUNTER — Other Ambulatory Visit: Payer: Self-pay

## 2018-08-28 NOTE — Patient Outreach (Signed)
[facility]) Care Management  08/28/2018  Cody Gomez 09/08/1931 [id number]  Second unsuccessful call to the patient on today's date. BSW left a HIPAA compliant voice message requesting a return call.  Plan: BSW to attempt a third and final outreach in the next four business days.  Daneen Schick, BSW, CDP [facility] [phone]

## 2018-08-31 ENCOUNTER — Ambulatory Visit: Payer: PPO | Admitting: Internal Medicine

## 2018-08-31 ENCOUNTER — Encounter: Payer: Self-pay | Admitting: Internal Medicine

## 2018-08-31 VITALS — BP 102/70 | HR 72 | Ht 67.0 in | Wt 203.2 lb

## 2018-08-31 DIAGNOSIS — Z95810 Presence of automatic (implantable) cardiac defibrillator: Secondary | ICD-10-CM | POA: Diagnosis not present

## 2018-08-31 DIAGNOSIS — I480 Paroxysmal atrial fibrillation: Secondary | ICD-10-CM | POA: Diagnosis not present

## 2018-08-31 DIAGNOSIS — I442 Atrioventricular block, complete: Secondary | ICD-10-CM

## 2018-08-31 DIAGNOSIS — Z79899 Other long term (current) drug therapy: Secondary | ICD-10-CM | POA: Diagnosis not present

## 2018-08-31 DIAGNOSIS — I428 Other cardiomyopathies: Secondary | ICD-10-CM

## 2018-08-31 DIAGNOSIS — I472 Ventricular tachycardia, unspecified: Secondary | ICD-10-CM

## 2018-08-31 LAB — CUP PACEART INCLINIC DEVICE CHECK
Battery Remaining Longevity: 58 mo
Brady Statistic AP VP Percent: 96.49 %
Brady Statistic AP VS Percent: 0.5 %
Brady Statistic AS VS Percent: 0.07 %
Brady Statistic RV Percent Paced: 99.13 %
Date Time Interrogation Session: 20190919152022
HIGH POWER IMPEDANCE MEASURED VALUE: 67 Ohm
HIGH POWER IMPEDANCE MEASURED VALUE: 90 Ohm
Implantable Lead Implant Date: 20080805
Lead Channel Impedance Value: 1007 Ohm
Lead Channel Impedance Value: 1140 Ohm
Lead Channel Impedance Value: 265.661
Lead Channel Impedance Value: 274.19 Ohm
Lead Channel Impedance Value: 284.683
Lead Channel Impedance Value: 589 Ohm
Lead Channel Impedance Value: 589 Ohm
Lead Channel Impedance Value: 722 Ohm
Lead Channel Impedance Value: 817 Ohm
Lead Channel Impedance Value: 931 Ohm
Lead Channel Impedance Value: 931 Ohm
Lead Channel Pacing Threshold Amplitude: 1.75 V
Lead Channel Pacing Threshold Pulse Width: 0.4 ms
Lead Channel Pacing Threshold Pulse Width: 0.4 ms
Lead Channel Sensing Intrinsic Amplitude: 1.625 mV
Lead Channel Sensing Intrinsic Amplitude: 10.375 mV
Lead Channel Setting Pacing Amplitude: 2 V
Lead Channel Setting Pacing Amplitude: 3.5 V
Lead Channel Setting Pacing Amplitude: 3.5 V
Lead Channel Setting Pacing Pulse Width: 0.4 ms
MDC IDC LEAD IMPLANT DT: 20080805
MDC IDC LEAD IMPLANT DT: 20180816
MDC IDC LEAD LOCATION: 753858
MDC IDC LEAD LOCATION: 753859
MDC IDC LEAD LOCATION: 753860
MDC IDC MSMT BATTERY VOLTAGE: 2.96 V
MDC IDC MSMT LEADCHNL LV IMPEDANCE VALUE: 1083 Ohm
MDC IDC MSMT LEADCHNL LV IMPEDANCE VALUE: 299.908
MDC IDC MSMT LEADCHNL LV IMPEDANCE VALUE: 312.507
MDC IDC MSMT LEADCHNL LV IMPEDANCE VALUE: 513 Ohm
MDC IDC MSMT LEADCHNL LV IMPEDANCE VALUE: 551 Ohm
MDC IDC MSMT LEADCHNL LV IMPEDANCE VALUE: 950 Ohm
MDC IDC MSMT LEADCHNL LV PACING THRESHOLD PULSEWIDTH: 0.8 ms
MDC IDC MSMT LEADCHNL RA IMPEDANCE VALUE: 418 Ohm
MDC IDC MSMT LEADCHNL RA PACING THRESHOLD AMPLITUDE: 0.5 V
MDC IDC MSMT LEADCHNL RA SENSING INTR AMPL: 2.375 mV
MDC IDC MSMT LEADCHNL RV PACING THRESHOLD AMPLITUDE: 1.75 V
MDC IDC MSMT LEADCHNL RV SENSING INTR AMPL: 11.125 mV
MDC IDC PG IMPLANT DT: 20180816
MDC IDC SET LEADCHNL LV PACING PULSEWIDTH: 0.8 ms
MDC IDC SET LEADCHNL RV SENSING SENSITIVITY: 1.2 mV
MDC IDC STAT BRADY AS VP PERCENT: 2.94 %
MDC IDC STAT BRADY RA PERCENT PACED: 96.72 %

## 2018-08-31 NOTE — Patient Instructions (Signed)
Medication Instructions:  Your physician recommends that you continue on your current medications as directed. Please refer to the Current Medication list given to you today.  Labwork: None ordered.  Testing/Procedures: None ordered.  Follow-Up: Your physician wants you to follow-up in: 6 months with Dr [name]. You will receive a reminder letter in the mail two months in advance. If you don't receive a letter, please call our office to schedule the follow-up appointment.  Remote monitoring is used to monitor your Pacemaker of ICD from home. This monitoring reduces the number of office visits required to check your device to one time per year. It allows [place] to keep an eye on the functioning of your device to ensure it is working properly. You are scheduled for a device check from home on 12/10/18. You may send your transmission at any time that day. If you have a wireless device, the transmission will be sent automatically. After your physician reviews your transmission, you will receive a postcard with your next transmission date.    Any Other Special Instructions Will Be Listed Below (If Applicable).     If you need a refill on your cardiac medications before your next appointment, please call your pharmacy.
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
## 2018-09-01 ENCOUNTER — Ambulatory Visit: Payer: Self-pay

## 2018-09-01 ENCOUNTER — Other Ambulatory Visit: Payer: Self-pay

## 2018-09-01 DIAGNOSIS — Z7901 Long term (current) use of anticoagulants: Secondary | ICD-10-CM | POA: Diagnosis not present

## 2018-09-01 LAB — CBC
HEMATOCRIT: 44.3 % (ref 37.5–51.0)
HEMOGLOBIN: 14.6 g/dL (ref 13.0–17.7)
MCH: 29 pg (ref 26.6–33.0)
MCHC: 33 g/dL (ref 31.5–35.7)
MCV: 88 fL (ref 79–97)
Platelets: 357 10*3/uL (ref 150–450)
RBC: 5.04 x10E6/uL (ref 4.14–5.80)
RDW: 13.6 % (ref 12.3–15.4)
WBC: 7.6 10*3/uL (ref 3.4–10.8)

## 2018-09-01 LAB — BASIC METABOLIC PANEL
BUN / CREAT RATIO: 17 (ref 10–24)
BUN: 17 mg/dL (ref 8–27)
CALCIUM: 9.6 mg/dL (ref 8.6–10.2)
CHLORIDE: 101 mmol/L (ref 96–106)
CO2: 24 mmol/L (ref 20–29)
Creatinine, Ser: 1.01 mg/dL (ref 0.76–1.27)
GFR, EST AFRICAN AMERICAN: 78 mL/min/{1.73_m2} (ref >59)
GFR, EST NON AFRICAN AMERICAN: 68 mL/min/{1.73_m2} (ref >59)
Glucose: 84 mg/dL (ref 65–99)
POTASSIUM: 4.3 mmol/L (ref 3.5–5.2)
Sodium: 138 mmol/L (ref 134–144)

## 2018-09-01 LAB — MAGNESIUM: MAGNESIUM: 2.3 mg/dL (ref 1.6–2.3)

## 2018-09-01 NOTE — Patient Outreach (Signed)
[facility]) Care Management  09/01/2018  PAYMON ROSENSTEEL [birth year] [id number]  Third unsuccessful outreach to the patient on today's date in efforts to complete a community resource consult and assist with the education and completion of advance directives. BSW has attempted to outreach the patient on 9/11, 9/16, and today 9/20. BSW also mailed an unsuccessful outreach letter to the patients home on 9/11. All outreach attempts have been unsuccessful with no return call from the patient.   Plan: BSW will plan to perform a discipline closure on Tuesday 9/24, if no return call is received from the patient, as that will be the tenth business day following the first unsuccessful outreach call.  Daneen Schick, BSW, CDP [facility] [phone]

## 2018-09-05 ENCOUNTER — Other Ambulatory Visit: Payer: Self-pay | Admitting: Pharmacist

## 2018-09-05 ENCOUNTER — Other Ambulatory Visit: Payer: Self-pay

## 2018-09-05 NOTE — Patient Outreach (Signed)
[place] [facility]) Care Management  09/05/2018  Cody Gomez [birth year] [id number]  BSW to perform a case closure due to the inability to establish contact with the patient. BSW has attempted to outreach the patient on 9/11, 9/16, and 9/20. All outreach attempts have been unsuccessful and BSW has left a HIPAA compliant voice message requesting a return call. BSW also mailed the patient an unsuccessful outreach letter on 9/11.   Daneen Schick, BSW, CDP [facility] [phone]

## 2018-09-05 NOTE — Patient Outreach (Signed)
[place] [facility]) Care Management  09/05/2018  Cody Gomez [birth year] [id number]  84 y.o. year old male referred to [place] for medication assistance.  PAP applications for Memory Dance and Tikosyn mailed to patient on 07/28/2018.  Applications have not yet been returned to [facility].    Successful call to Cody Gomez today to follow-up on applications and interest in pursuing patient assistance.  Cody Gomez states he still has the applications at home and has not had a chance to complete them yet.  He states he isn't sure he should apply.  He states he is able to afford medications at this time.  He requests that I reach out to his son, Cody Gomez, to discuss medications.   Successful call to patient's son.  Son reports that he helps patient fill out all paperwork and can discuss the applications today with patient.  He states that patient's tikosyn co-pay was recently $3.00 and patient does not likely need to apply for this program.  I reviewed Extra Help program and Memory Dance program with son.  I provided my phone number to son if he would like to reach back out to me with questions. Son voiced understanding.    Care coordination call placed to [place].  Confirmed Tikosyn co-pay = 3.00 even in coverage gap.  Breo Ellipta copay in coverage gap = $87.35.    Plan: Await call back from son and return of [facility] application.  Will follow back up next week with family if I have not heard back yet.   Ralene Bathe, PharmD, [place] [phone]

## 2018-09-09 LAB — CUP PACEART REMOTE DEVICE CHECK
Battery Remaining Longevity: 52 mo
Battery Voltage: 2.97 V
Brady Statistic AP VP Percent: 97.31 %
Brady Statistic AS VS Percent: 0.07 %
Brady Statistic RA Percent Paced: 97.92 %
Brady Statistic RV Percent Paced: 98.42 %
Date Time Interrogation Session: 20190909084224
HighPow Impedance: 63 Ohm
HighPow Impedance: 80 Ohm
Implantable Lead Implant Date: 20080805
Implantable Lead Implant Date: 20080805
Implantable Lead Implant Date: 20180816
Lead Channel Impedance Value: 1121 Ohm
Lead Channel Impedance Value: 265.661
Lead Channel Impedance Value: 274.19 Ohm
Lead Channel Impedance Value: 284.683
Lead Channel Impedance Value: 296.578
Lead Channel Impedance Value: 418 Ohm
Lead Channel Impedance Value: 551 Ohm
Lead Channel Impedance Value: 589 Ohm
Lead Channel Impedance Value: 703 Ohm
Lead Channel Impedance Value: 722 Ohm
Lead Channel Impedance Value: 893 Ohm
Lead Channel Impedance Value: 893 Ohm
Lead Channel Impedance Value: 931 Ohm
Lead Channel Pacing Threshold Amplitude: 0.375 V
Lead Channel Pacing Threshold Amplitude: 2.375 V
Lead Channel Pacing Threshold Pulse Width: 0.4 ms
Lead Channel Pacing Threshold Pulse Width: 0.4 ms
Lead Channel Sensing Intrinsic Amplitude: 2.25 mV
Lead Channel Setting Pacing Amplitude: 2 V
Lead Channel Setting Pacing Amplitude: 3.5 V
Lead Channel Setting Pacing Pulse Width: 0.4 ms
Lead Channel Setting Pacing Pulse Width: 0.8 ms
Lead Channel Setting Sensing Sensitivity: 1.2 mV
MDC IDC LEAD LOCATION: 753858
MDC IDC LEAD LOCATION: 753859
MDC IDC LEAD LOCATION: 753860
MDC IDC MSMT LEADCHNL LV IMPEDANCE VALUE: 1121 Ohm
MDC IDC MSMT LEADCHNL LV IMPEDANCE VALUE: 308.894
MDC IDC MSMT LEADCHNL LV IMPEDANCE VALUE: 513 Ohm
MDC IDC MSMT LEADCHNL LV IMPEDANCE VALUE: 988 Ohm
MDC IDC MSMT LEADCHNL LV PACING THRESHOLD PULSEWIDTH: 0.8 ms
MDC IDC MSMT LEADCHNL RA SENSING INTR AMPL: 2.25 mV
MDC IDC MSMT LEADCHNL RV IMPEDANCE VALUE: 589 Ohm
MDC IDC MSMT LEADCHNL RV PACING THRESHOLD AMPLITUDE: 1.875 V
MDC IDC MSMT LEADCHNL RV SENSING INTR AMPL: 10.375 mV
MDC IDC MSMT LEADCHNL RV SENSING INTR AMPL: 10.375 mV
MDC IDC PG IMPLANT DT: 20180816
MDC IDC SET LEADCHNL RV PACING AMPLITUDE: 4 V
MDC IDC STAT BRADY AP VS PERCENT: 1.05 %
MDC IDC STAT BRADY AS VP PERCENT: 1.57 %

## 2018-09-13 ENCOUNTER — Other Ambulatory Visit: Payer: Self-pay | Admitting: Pharmacist

## 2018-09-13 DIAGNOSIS — Z23 Encounter for immunization: Secondary | ICD-10-CM | POA: Diagnosis not present

## 2018-09-13 NOTE — Patient Outreach (Signed)
[facility]) Care Management  09/13/2018  Cody Gomez 1931-11-28 [id number]  Successful call to Mr. Cody Gomez today to follow-up on PAP for Breo Ellipta.   [name] reports he mailed back Breo Ellipta PAP to [facility] on 09/11/2018 and included all necessary documents.   Plan: Will follow-up that application received in the mail and submitted to company.  Ralene Bathe, PharmD, [place] [phone]

## 2018-09-14 ENCOUNTER — Other Ambulatory Visit: Payer: Self-pay | Admitting: Pharmacy Technician

## 2018-09-14 NOTE — Patient Outreach (Signed)
[place] [facility]) Care Management  09/14/2018  JOAN AVETISYAN 04-14-1931 [id number]   Received patient portion of [employer] and Tikosyn patient assistance applications. Faxed completed applications and required documents into [place] and [employer].  Will follow up with both companies in 2-3 business days to check status of applications.  Maud Deed [place], [place] Management [phone]

## 2018-09-18 ENCOUNTER — Other Ambulatory Visit: Payer: Self-pay | Admitting: Internal Medicine

## 2018-09-19 ENCOUNTER — Ambulatory Visit: Payer: Self-pay | Admitting: Pharmacist

## 2018-09-20 ENCOUNTER — Other Ambulatory Visit: Payer: Self-pay | Admitting: Pharmacy Technician

## 2018-09-20 NOTE — Patient Outreach (Signed)
[place] [facility]) Care Management  09/20/2018  RALPHEAL ZAPPONE [birth year] [id number]  Follow up call to [place] to check status of patients application for Breo Ellipta. DeMar states that OOP spending report needs Pharmacist signature. Reached out to [employer] who was able to provide me with another Proof of spending. Submitted document to company.  Will follow up with [place] in 1-2 business days.  Contact [employer] to check status of Tikosyn and was informed that additional proof of income documents were required. After reviewing with [facility] Ralene Bathe, she was informed by Mr. Minerd' son that his Tikosyn actually has an affordable co-pay. We will no longer proceed with [place] application.  Maud Deed [place], [place] Management [phone]

## 2018-09-26 ENCOUNTER — Other Ambulatory Visit: Payer: Self-pay | Admitting: Pharmacist

## 2018-09-26 NOTE — Patient Outreach (Signed)
[place] [facility]) Care Management  [place] 09/26/2018  Cody Gomez [birth year] [id number]  Reason for referral: medication assistance  Care coordination call placed to [place] regarding Breo Ellipta PAP.  Representative states missing information received on 09/22/2018.  Application processed during phone call.  Patient approved on 09/26/2018 through 12/12/2018.  Will take 7-10 business days for shipment to arrive to patient's home.     Successful call to Mr. Cody Gomez.  I relayed message that [employer] approved and estimated shipping time.  Patient is very appreciative of [facility] assistance.  He reports he has enough medication to last another 2 weeks and therefore does not need to get a refill before medication arrives.  I encouraged patient to keep my phone number and to call me next year when he is close to spending $600 on co-pays and I can assist with application process again.  Patient voiced understanding.   Plan: I will close [place] case at this time as goals of care have been met.   Thank you for allowing [facility] pharmacy to be involved in this patient's care.     Ralene Bathe, PharmD, [place] [phone]

## 2018-10-02 ENCOUNTER — Ambulatory Visit (INDEPENDENT_AMBULATORY_CARE_PROVIDER_SITE_OTHER): Payer: PPO

## 2018-10-02 DIAGNOSIS — Z95810 Presence of automatic (implantable) cardiac defibrillator: Secondary | ICD-10-CM | POA: Diagnosis not present

## 2018-10-02 DIAGNOSIS — I5022 Chronic systolic (congestive) heart failure: Secondary | ICD-10-CM | POA: Diagnosis not present

## 2018-10-03 NOTE — Progress Notes (Signed)
EPIC Encounter for ICM Monitoring  Patient Name: Cody Gomez is a 84 y.o. male Date: 10/03/2018 Primary Care Physican: Street, Sharon Mt, MD Primary [place] Electrophysiologist: [name] Dry Weight:201lbs BiV Pacing: 96.8%  Effective: 88.7%    Heart Failure questions reviewed, pt asymptomatic.   Thoracic impedance normal.   Prescribed: Furosemide 40 mg every day. Potassium 20 mEqtake 1 tablet by mouth twice daily.  Labs:  03/03/2018 Creatinine 0.90, BUN 16, Potassium 3.9, Sodium 139, EGFR 78-90  Recommendations: No changes.  fluid intake to 64 oz daily.  Encouraged to call for fluid symptoms.  Follow-up plan: ICM clinic phone appointment on 11/02/2018    Copy of ICM check sent to Dr. [name].   3 month ICM trend: 10/02/2018    1 Year ICM trend:       Rosalene Billings, RN 10/03/2018 9:04 AM

## 2018-10-09 ENCOUNTER — Ambulatory Visit: Payer: PPO | Admitting: Internal Medicine

## 2018-10-19 ENCOUNTER — Other Ambulatory Visit: Payer: Self-pay | Admitting: Internal Medicine

## 2018-10-26 DIAGNOSIS — Z7901 Long term (current) use of anticoagulants: Secondary | ICD-10-CM | POA: Diagnosis not present

## 2018-10-27 ENCOUNTER — Telehealth: Payer: Self-pay

## 2018-10-27 NOTE — Telephone Encounter (Signed)
Pt called stating he was exercising and he felt like his device shocked him. I had him send a manual transmission and told him the nurse would look at it. I told him the nurse will give him a call back. His phone number is [phone]

## 2018-10-27 NOTE — Telephone Encounter (Signed)
Spoke with pt informed him that he did not receive a shock, pt stated that he didn't feel a shock he just felt like he went out pt stated this happened after therapy today pt did not know his BP at the time, informed pt to make PT aware of this episode before he has another session pt voiced understanding

## 2018-11-02 ENCOUNTER — Ambulatory Visit (INDEPENDENT_AMBULATORY_CARE_PROVIDER_SITE_OTHER): Payer: PPO

## 2018-11-02 DIAGNOSIS — I5022 Chronic systolic (congestive) heart failure: Secondary | ICD-10-CM | POA: Diagnosis not present

## 2018-11-02 DIAGNOSIS — Z95810 Presence of automatic (implantable) cardiac defibrillator: Secondary | ICD-10-CM | POA: Diagnosis not present

## 2018-11-03 DIAGNOSIS — Z7901 Long term (current) use of anticoagulants: Secondary | ICD-10-CM | POA: Diagnosis not present

## 2018-11-03 NOTE — Progress Notes (Signed)
EPIC Encounter for ICM Monitoring  Patient Name: Cody Gomez is a 84 y.o. male Date: 11/03/2018 Primary Care Physican: Street, Sharon Mt, MD Primary [place] Electrophysiologist: [name] BiV Pacing: 97.3%Effective: 89.2%        Today's Weight: 201 lbs        Heart Failure questions reviewed, pt asymptomatic.  He admitted dietary indiscretion with salt.  Thoracic impedance normal.   Prescribed: Furosemide 40 mg every day. Potassium 20 mEqtake 1 tablet by mouth twice daily.  Labs:  03/03/2018 Creatinine 0.90, BUN 16, Potassium 3.9, Sodium 139, EGFR 78-90  Recommendations: No changes.  Encouraged to call for fluid symptoms.  Follow-up plan: ICM clinic phone appointment on 12/04/2018.     Copy of ICM check sent to Dr. [name].   3 month ICM trend: 12/04/2018    1 Year ICM trend:             Rosalene Billings, RN 11/03/2018 3:13 PM

## 2018-11-16 ENCOUNTER — Encounter: Payer: Self-pay | Admitting: Internal Medicine

## 2018-11-16 ENCOUNTER — Ambulatory Visit: Payer: PPO | Admitting: Internal Medicine

## 2018-11-16 VITALS — BP 120/70 | HR 84 | Ht 67.0 in | Wt 199.6 lb

## 2018-11-16 DIAGNOSIS — J683 Other acute and subacute respiratory conditions due to chemicals, gases, fumes and vapors: Secondary | ICD-10-CM | POA: Diagnosis not present

## 2018-11-16 LAB — NITRIC OXIDE

## 2018-11-16 NOTE — Addendum Note (Signed)
Addended by: Jannette Spanner on: 11/16/2018 05:15 PM   Modules accepted: Orders

## 2018-11-16 NOTE — Patient Instructions (Addendum)
ICD-10-CM   1. Reactive airways dysfunction syndrome, moderate persistent, uncomplicated (HCC) E61.4     Asthma under good control Nitric oxide test normalized  Plan - continue low dose breo daily  - ask insurance if dulera or symbicort are cheaper options - Please talk to PCP Street, Sharon Mt, MD -  and ensure you get  shingrix (GSK) inactivated vaccine against shingles - use albuterol as needed  Followup 9 months or sooner

## 2018-11-16 NOTE — Progress Notes (Signed)
HPI    IOV 11/09/2017  Chief Complaint  Patient presents with  . Advice Only    Former [name] pt last seen 10/29/14 with dx copd.  C/o cough with white mucus and SOB if he does anything for a long time period. Denies any CP.      84 year old male establishing care for COPD with a history of recurrent COPD exacerbation.  History is gained from talking to him, his son and review of the old chart.  He is to be seen by Dr. [name] up on 01/2014 and then because he lives in [place] he decided to just seek local care.  He understands state that approximately 20 years ago he was exposed to some sort of a chemical and since then he has a history of asthma.  He also has a greater than 70 pack smoking history but he is quit.  And since then he gets frequent respiratory exacerbations.  Just in the last few years he has had prednisone at least 5 or 10 times.  He tells me that every time he gets an exacerbation prednisone and steroids are the only thing that helps him.  He says local doctors are positive by this and are getting increasingly reluctant to treat him.  Occasionally exacerbations are treated by antibiotics.  Most recent prednisone was 2 days ago.  In between exacerbations he is maintained on Symbicort he takes albuterol and duo nebs as needed.  Currently has baseline COPD Score is 20 which is his baseline symptomatology.  This gets worse with exacerbations.  He thinks he might have spring and pollen allergy.  Of note, in May 2018 he had a CT scan of the chest that showed an enlarged goiter compressing his airway and causing venous occlusion.  In June 2018 this was excised.  He does not think his respiratory issues are related to the goiter despite airway compromise.  However he does describe a lot of his respiratory symptoms by pointing to the upper airway.  At this point in time he feels it is too soon to know if thyroidectomy is going to improve his respiratory  status but he remains pessimistic.  May 2018 CT scan of the chest did not show any lung cancer.  I personally visualized the CT scan.  His alpha-1 status is not known   OV 01/02/2018    Chief Complaint  Patient presents with  . Follow-up    PFT done today. Pt states he has been doing okay. States that his breathing has become worse and was put on prednisone and an abx 12/02/17 and breathing is better since then.  Pt states he believes he is getting another flare-up.    Follow-up history of heavy smoking with differential diagnosis of asthma versus COPD in the setting of recurrent bronchitis  He presents with his son.  Of note he did have a thyroidectomy in June 2018 because of an enlarged goiter compressing his upper airway.  He tells me now that after the thyroidectomy he did get a little bit better with the shortness of breath but he still continues to have shortness of breath with exertion relieved by rest although it appears that this is more fatigue and his legs getting tired.  But he does definitely to talk about recurrent bronchitis.  This associated with wheezing and improved with prednisone and antibiotic.  After I saw him end of November 2018 he did have a visit with his  primary care physician in December 2018 where he apparently was wheezing significantly and given antibiotic and prednisone and improved.  Currently he is also reporting 2-3 days of congestion.  In the interim we did do allergy workup in November 2018 this is normal including blood allergy panel and IgE.  Today he had pulmonary function test that is in the setting of Symbicort but in the midst of an acute bronchitis.  I personally visualized the image and this is completely normal.  Even the DLCO is normal.  The performed exam nitric oxide testing and this shows 116 ppb and very elevated. Nov 2018 eos 300 cells/cumm  We discussed the diagnosis a little bit more and he tells me that there is confusion between asthma and COPD  all along.  However he was very categorical that approximately 20-25 years ago while at work place he was exposed to muriatic acid due to a chemical spill and then since then has had respiratory issues since the following day.  In that particular day changed hspulmonary life entirely     Results for LYNELL, KUSSMAN (MRN [id number]) as of 01/02/2018 11:26  Ref. Range 12/22/2010 21:35 12/22/2010 21:38 11/09/2017 11:49  IgE (Immunoglobulin E), Serum Latest Ref Range: <OR=114 kU/L 12.5 14.0 109     IMPRESSION: ct chest may 2018 1. SVC is patent. 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 2. Aortic atherosclerosis (ICD10-170.0). Coronary artery calcification. 3. Markedly enlarged, nodular and heterogeneous thyroid, suggestive of a goiter, with associated airway narrowing. Sonographic evaluation performed 02/24/2016. 4. Cholelithiasis.   Electronically Signed   By: Lorin Picket M.D.   On: 04/25/2017 10:39   OV 04/10/2018  Chief Complaint  Patient presents with  . Follow-up    Pt states he has been doing well. States his breathing has been doing good. Had c/o midsternal chest pain which seems like it was acid reflux related.   FU RADS/ASthma - s/p exposutre to muriatic acid  25 years ago (dx changed from COPD to RADS Jan 2019) - nitric oxide testing and this shows 116 ppb and very elevated. Nov 2018 eos 300 cells/cumm. Normal IgE.  Status post thyroidectomy June 2018 and defibrillator placement August 2018     Mr. Pennings  returns for follow-up.  Since starting Breo at last visit January 2019 he says dyspnea is improved.  Overall he is feeling great.  His son is here with him.  They both tell me that after thyroidectomy June 2018 and then defibrillator placement August 2018 and then [facility] starting January 2019 there is progressive improvement in dyspnea.  Nevertheless he still has residual stable dyspnea on  exertion.  He does not do any exercise and is sedentary.  He does not have any back pain or knee pain.  He did cardiac rehab many years ago and it helped him.  He is nervous to do any rehabilitation right now because of his ischemic heart disease and he wants me to check with Dr. [name] if it would be okay for him to do that.  Currently his asthma control questionnaire score is 0.6.  He does not wake up in the middle of the night.  When he wakes up he has very mild symptoms.  He is very slightly limited in his activities because of asthma.  He explains very little shortness of breath because of asthma.  He is not wheezing at all and he does not use albuterol for rescue. OV 11/16/2018  Subjective:  Patient ID: ADISA LITT, male , DOB: 26-Nov-1931 , age 52 y.o. , MRN: [id number] , ADDRESS: [address] [place] [address]   11/16/2018 -   Chief Complaint  Patient presents with  . Follow-up    f/u reactive airway disease, pt reports he is doing well, no exacerbations, does cough some but not as much    FU RADS/ASthma - s/p exposutre to muriatic acid  25 years ago (dx changed from COPD to RADS Jan 2019) - nitric oxide testing and this shows 116 ppb and very elevated. Nov 2018 eos 300 cells/cumm. Normal IgE.  Status post thyroidectomy June 2018 and defibrillator placement August 2018   HPI SHARRIEFF SPRATLIN 84 y.o. -presents for asthma follow-up.  He continues with his Breo.  He says it works wonderful for him.  He is essentially asymptomatic.  There is no waking up in the middle of the night.  No nocturnal symptoms.  No symptoms when he wakes up in the morning.  No shortness of breath or wheezing or cough or chest tightness.  No orthopnea no proximal nocturnal dyspnea.  Exam nitric oxide today is normalized to 27 ppb.  He does have cost issues with the [employer].  He says he is now with health team advantage through [facility] health.  Overall this insurance plan is cheaper but he has to spend the first $600 every  year.  He is now having samples of Breo.  He is asking for cheaper options if possible.  Results for GLENMORE, KARL (MRN [id number]) as of 11/16/2018 16:24  Ref. Range 01/02/2018 16:58  Nitric Oxide Unknown 116  Results for JAKAVION, BILODEAU (MRN [id number]) as of 11/16/2018 16:24  Ref. Range 03/19/2014 11:18 02/18/2017 15:23 04/19/2017 14:43 07/22/2017 10:51 11/09/2017 11:49  EOS (ABSOLUTE) Latest Ref Range: 0.0 - 0.4 x10E3/uL  0.1 0.0 0.3     ROS - per HPI     has a past medical history of Asthma, Atrial fibrillation ([place]), Automatic implantable cardioverter-defibrillator in situ (10/29/10), CHF (congestive heart failure) (HCC), Chronic lower back pain, GERD (gastroesophageal reflux disease), History of kidney stones, History of stomach ulcers, HLD (hyperlipidemia), HTN (hypertension), Hyperthyroidism, Hypothyroidism, ICD (implantable cardioverter-defibrillator) lead failure over sensing with inhibition of pacing (05/08/2015), Myocardial infarction [facility]), Nonischemic dilated cardiomyopathy ([place]), OSA on CPAP, Pneumonia, Prostate cancer ([place]), Skin cancer, and Ventricular tachycardia ([place]).   reports that he quit smoking about 35 years ago. His smoking use included cigarettes, pipe, and cigars. He has a 76.00 pack-year smoking history. He quit smokeless tobacco use about 34 years ago.  His smokeless tobacco use included chew.  Past Surgical History:  Procedure Laterality Date  . ABLATION  03/26/2014   EPS/RFCA by Dr Rayann Heman - 3 out of 4 VT's successfully ablated  . APPENDECTOMY    . BIV UPGRADE N/A 04/25/2017   Procedure: BiV Upgrade;  Surgeon: Deboraha Sprang, MD;  Location: [place] [facility];  Service: Cardiovascular;  Laterality: N/A;  . BIV UPGRADE N/A 07/28/2017   Procedure: BiVI Upgrade;  Surgeon: Deboraha Sprang, MD;  Location: [place] [facility];  Service: Cardiovascular;  Laterality: N/A;  . CARDIAC DEFIBRILLATOR PLACEMENT  07/18/2007   MDT ICD implanted with a SJM [id number] RV leald  . IMPLANTABLE  CARDIOVERTER DEFIBRILLATOR GENERATOR CHANGE  06/22/13   medtronic Evera XT DR generator change peformed by Dr [name]  . INGUINAL HERNIA REPAIR Right   . LEAD REVISION N/A 06/22/2013   Procedure: LEAD REVISION;  Surgeon: Deboraha Sprang,  MD;  Location: [place] [facility];  Service: Cardiovascular;  Laterality: N/A;  . PROSTATECTOMY    . RIGHT/LEFT HEART CATH AND CORONARY ANGIOGRAPHY N/A 02/28/2017   Procedure: Right/Left Heart Cath and Coronary Angiography;  Surgeon: [name], MD;  Location: [facility];  Service: Cardiovascular;  Laterality: N/A;  . SKIN CANCER EXCISION     "back; chin"  . THYROIDECTOMY  05/26/2017  . THYROIDECTOMY N/A 05/26/2017   Procedure: TOTAL THYROIDECTOMY;  Surgeon: Armandina Gemma, MD;  Location: [facility];  Service: General;  Laterality: N/A;  . V-TACH ABLATION N/A 03/26/2014   Procedure: V-TACH ABLATION;  Surgeon: Coralyn Mark, MD;  Location: [facility] CATH [facility];  Service: Cardiovascular;  Laterality: N/A;    Allergies  Allergen Reactions  . Dobutamine Other (See Comments)    ASYSTOLE  . Penicillins Swelling    SWELLING AT INJECTION SITE  Has patient had a PCN reaction causing immediate rash, facial/tongue/throat swelling, SOB or lightheadedness with hypotension: No Has patient had a PCN reaction causing severe rash involving mucus membranes or skin necrosis: No Has patient had a PCN reaction that required hospitalization: No Has patient had a PCN reaction occurring within the last 10 years: No If all of the above answers are "NO", then may proceed with Cephalosporin use.     Immunization History  Administered Date(s) Administered  . Influenza Split 09/12/2012, 08/13/2013  . Influenza Whole 09/12/2010, 10/14/2011  . Influenza, High Dose Seasonal PF 10/19/2017  . Influenza-Unspecified 09/12/2014  . Pneumococcal Conjugate-13 06/18/2014  . Pneumococcal Polysaccharide-23 09/12/2009  . Zoster 07/19/2012    Family History  Problem Relation Age of Onset  . Heart  disease Mother   . Cancer Father        bladder      Current Outpatient Medications:  .  albuterol (PROVENTIL HFA;VENTOLIN HFA) 108 (90 Base) MCG/ACT inhaler, Inhale 2 puffs into the lungs every 6 (six) hours as needed for wheezing or shortness of breath., Disp: , Rfl:  .  BREO ELLIPTA 200-25 MCG/INH AEPB, INHALE 1 PUFF INTO THE LUNGS DAILY, Disp: 60 each, Rfl: 2 .  carvedilol (COREG) 12.5 MG tablet, TAKE 1 TABLET BY MOUTH TWICE DAILY, Disp: 180 tablet, Rfl: 3 .  dofetilide (TIKOSYN) 500 MCG capsule, TAKE 1 CAPSULE BY MOUTH TWICE DAILY, Disp: 180 capsule, Rfl: 3 .  fluticasone furoate-vilanterol (BREO ELLIPTA) 200-25 MCG/INH AEPB, Inhale 1 puff into the lungs daily., Disp: 60 each, Rfl: 11 .  furosemide (LASIX) 40 MG tablet, Take 40 mg by mouth daily., Disp: , Rfl:  .  guaiFENesin (MUCINEX) 600 MG 12 hr tablet, Take 1,200 mg by mouth daily as needed (congestion)., Disp: , Rfl:  .  ipratropium-albuterol (DUONEB) 0.5-2.5 (3) MG/3ML SOLN, Inhale 3 mLs into the lungs every 4 (four) hours as needed for shortness of breath or wheezing., Disp: , Rfl: 4 .  levothyroxine (SYNTHROID, LEVOTHROID) 112 MCG tablet, Take 112 mcg by mouth daily before breakfast. , Disp: , Rfl:  .  mexiletine (MEXITIL) 150 MG capsule, TAKE 1 CAPSULE(150 MG) BY MOUTH TWICE DAILY, Disp: 180 capsule, Rfl: 3 .  naproxen sodium (ALEVE) 220 MG tablet, Take 440 mg by mouth 2 (two) times daily as needed (pain or headache). , Disp: , Rfl:  .  NITROSTAT 0.4 MG SL tablet, Place 1 tablet under the tongue every 5 (five) minutes as needed for chest pain. , Disp: , Rfl:  .  potassium chloride SA (K-DUR,KLOR-CON) 20 MEQ tablet, TAKE 1 TABLET(20 MEQ) BY MOUTH TWICE DAILY,  Disp: 180 tablet, Rfl: 3 .  warfarin (COUMADIN) 5 MG tablet, Take 2.5-5 mg by mouth at bedtime. Takes 2.5 mg (1/2 tab) on Monday, Wednesday, Friday, and Sunday. Takes 5 mg on Tuesday, Thursday, and Saturday, Disp: , Rfl:       Objective:   Vitals:   11/16/18 1536  BP:  120/70  Pulse: 84  SpO2: 96%  Weight: 199 lb 9.6 oz (90.5 kg)  Height: 5\' 7"  (1.702 m)    Estimated body mass index is 31.26 kg/m as calculated from the following:   Height as of this encounter: 5\' 7"  (1.702 m).   Weight as of this encounter: 199 lb 9.6 oz (90.5 kg).  @WEIGHTCHANGE @  [employer]   11/16/18 1536  Weight: 199 lb 9.6 oz (90.5 kg)     Physical Exam  General Appearance:    Alert, cooperative, no distress, appears stated age - yes , Deconditioned looking - no , OBESE  - no, Sitting on Wheelchair -  no  Head:    Normocephalic, without obvious abnormality, atraumatic  Eyes:    PERRL, conjunctiva/corneas clear,  Ears:    Normal TM's and external ear canals, both ears  Nose:   Nares normal, septum midline, mucosa normal, no drainage    or sinus tenderness. OXYGEN ON  - no . Patient is @ ra   Throat:   Lips, mucosa, and tongue normal; teeth and gums normal. Cyanosis on lips - no  Neck:   Supple, symmetrical, trachea midline, no adenopathy;    thyroid:  no enlargement/tenderness/nodules; no carotid   bruit or JVD  Back:     Symmetric, no curvature, ROM normal, no CVA tenderness  Lungs:     Distress - no , Wheeze no, Barrell Chest - no, Purse lip breathing - no, Crackles - no   Chest Wall:    No tenderness or deformity.    Heart:    Regular rate and rhythm, S1 and S2 normal, no rub   or gallop, Murmur - no  Breast Exam:    NOT DONE  Abdomen:     Soft, non-tender, bowel sounds active all four quadrants,    no masses, no organomegaly. Visceral obesity - yes  Genitalia:   NOT DONE  Rectal:   NOT DONE  Extremities:   Extremities - normal, Has Cane - no, Clubbing - no, Edema - no  Pulses:   2+ and symmetric all extremities  Skin:   Stigmata of Connective Tissue Disease - no  Lymph nodes:   Cervical, supraclavicular, and axillary nodes normal  Psychiatric:  Neurologic:   Pleasant - yes, Anxious - no, Flat affect - no  CAm-ICU - neg, Alert and Oriented x 3 - yes, Moves  all 4s - yes, Speech - normal, Cognition - intact           Assessment:       ICD-10-CM   1. Reactive airways dysfunction syndrome, moderate persistent, uncomplicated (HCC) O37.8        Plan:     Patient Instructions     ICD-10-CM   1. Reactive airways dysfunction syndrome, moderate persistent, uncomplicated (HCC) H88.5     Asthma under good control Nitric oxide test normalized  Plan - continue low dose breo daily  - ask insurance if dulera or symbicort are cheaper options - Please talk to PCP Street, Sharon Mt, MD -  and ensure you get  shingrix (GSK) inactivated vaccine against shingles - use albuterol as needed  Followup  9 months or sooner     SIGNATURE    Dr. Brand Males, M.D., F.C.C.P,  Pulmonary and Critical Care Medicine Staff Physician, [place] Director - Interstitial Lung Disease  Program  Pulmonary [place] [facility] [place], [place], [address]  Pager: [phone], If no answer or between  15:00h - 7:00h: call [phone] Telephone: [phone]  4:46 PM 11/16/2018

## 2018-11-17 DIAGNOSIS — Z7901 Long term (current) use of anticoagulants: Secondary | ICD-10-CM | POA: Diagnosis not present

## 2018-11-28 ENCOUNTER — Other Ambulatory Visit: Payer: Self-pay

## 2018-11-28 NOTE — Patient Outreach (Signed)
[facility]) Care Management  11/28/2018  [name] 14-Feb-1931 [id number]   Telephone Screen  Referral Date: 11/28/18 Referral Source: [employer] Referral Reason: " Home visit on 11/21/18-diagnosis of reactive airway dysfunction syndrome(RADS), supposed to use CPAP-reports noncompliance, reports compliance with inhalers and pulmonologist follow up" Insurance: HTA   Outreach attempt # 1 to patient. Spoke with patient. He voices that he is doing fairly well. RN CM discussed with patient referral source and reason. He states that the has not used his CPAP for an extended time as he "doesn't need it." Patient states that he is breathing has been controlled. He is adhering to med regimen given by lung specialist and taking inhalers as ordered. He denies any SOB at present. He reports that he only gets "short winded" with exertion "like a normal person would." Patient shares that he sees his MDs regularly and they are aware that he is not using CPAP. He does not wish for any further education/support in managing RADS. He feels that he is following plan of care as ordered by MDs. Patient resides in his home alone. He voices that he has supportive son and daughter in law that assist him as needed. Patient states that he is independent with ADLs/IADLs. He denies any recent falls. He is able to drive himself to appts but son normally accompanies him so that he can know what's going on with his care. He denies any issues affording/managing meds. Patient recalls working with [facility] pharmacist back in October. He still has info and knows to call if any needs or concerns arise. Patient denies needing any THN services at this time. He is agreeable to RN CM mailing out info for future reference.      Plan: RN CM will close case at this time. RN CM will send [facility] letter along with brochure and magnet to patient.   Enzo Montgomery, RN,BSN,CCM [facility] Management Telephonic Care Management  Coordinator Direct Phone: [phone] Toll Free: [phone] Fax: [phone]

## 2018-12-02 IMAGING — CR DG CHEST 2V
2 series · 2 of 2 positions shown · non-contrast
Comparison: 03/04/2015.

CLINICAL DATA: RIGHT calf.  Short of breath.

EXAM:
CHEST  2 VIEW

[chest pa]
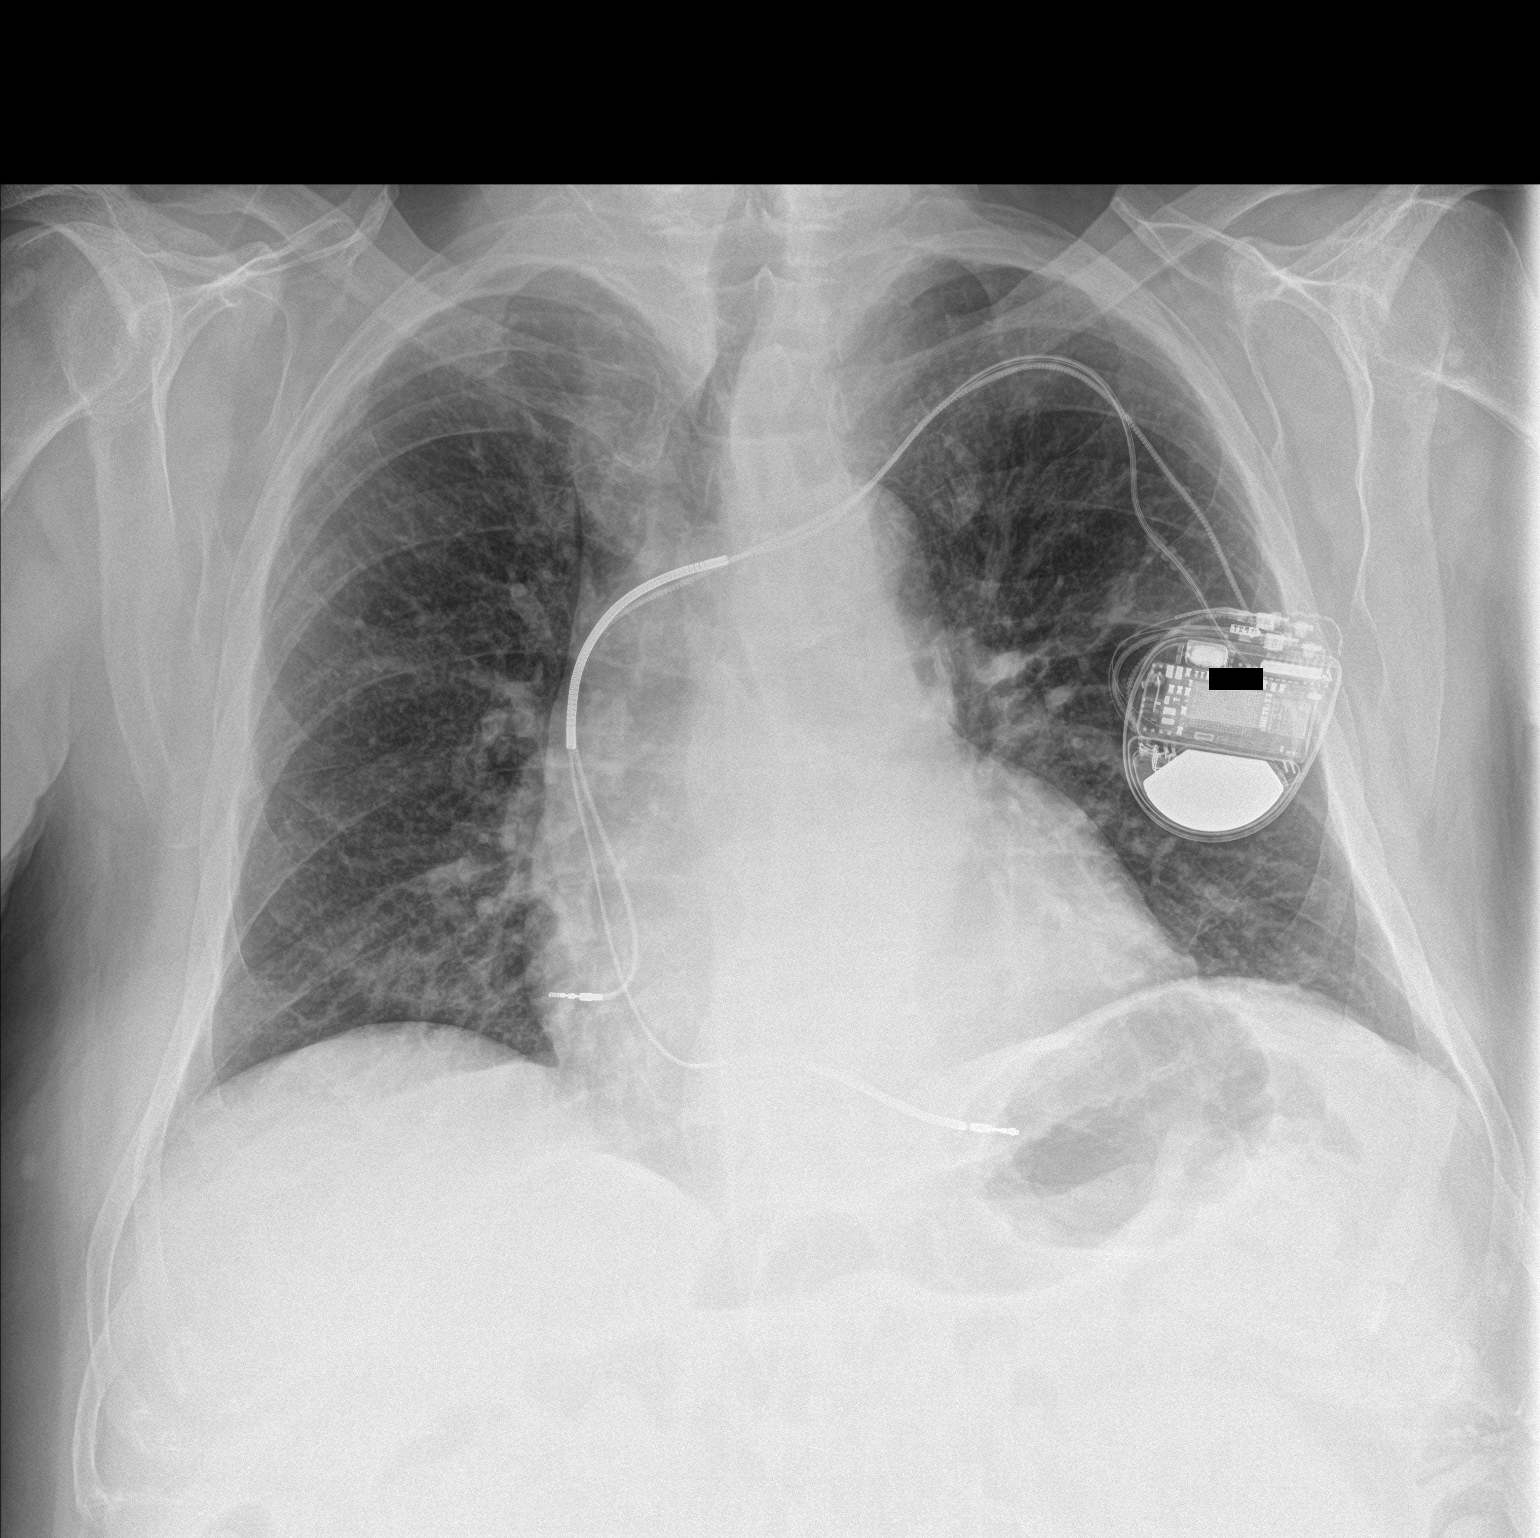

[chest lat]
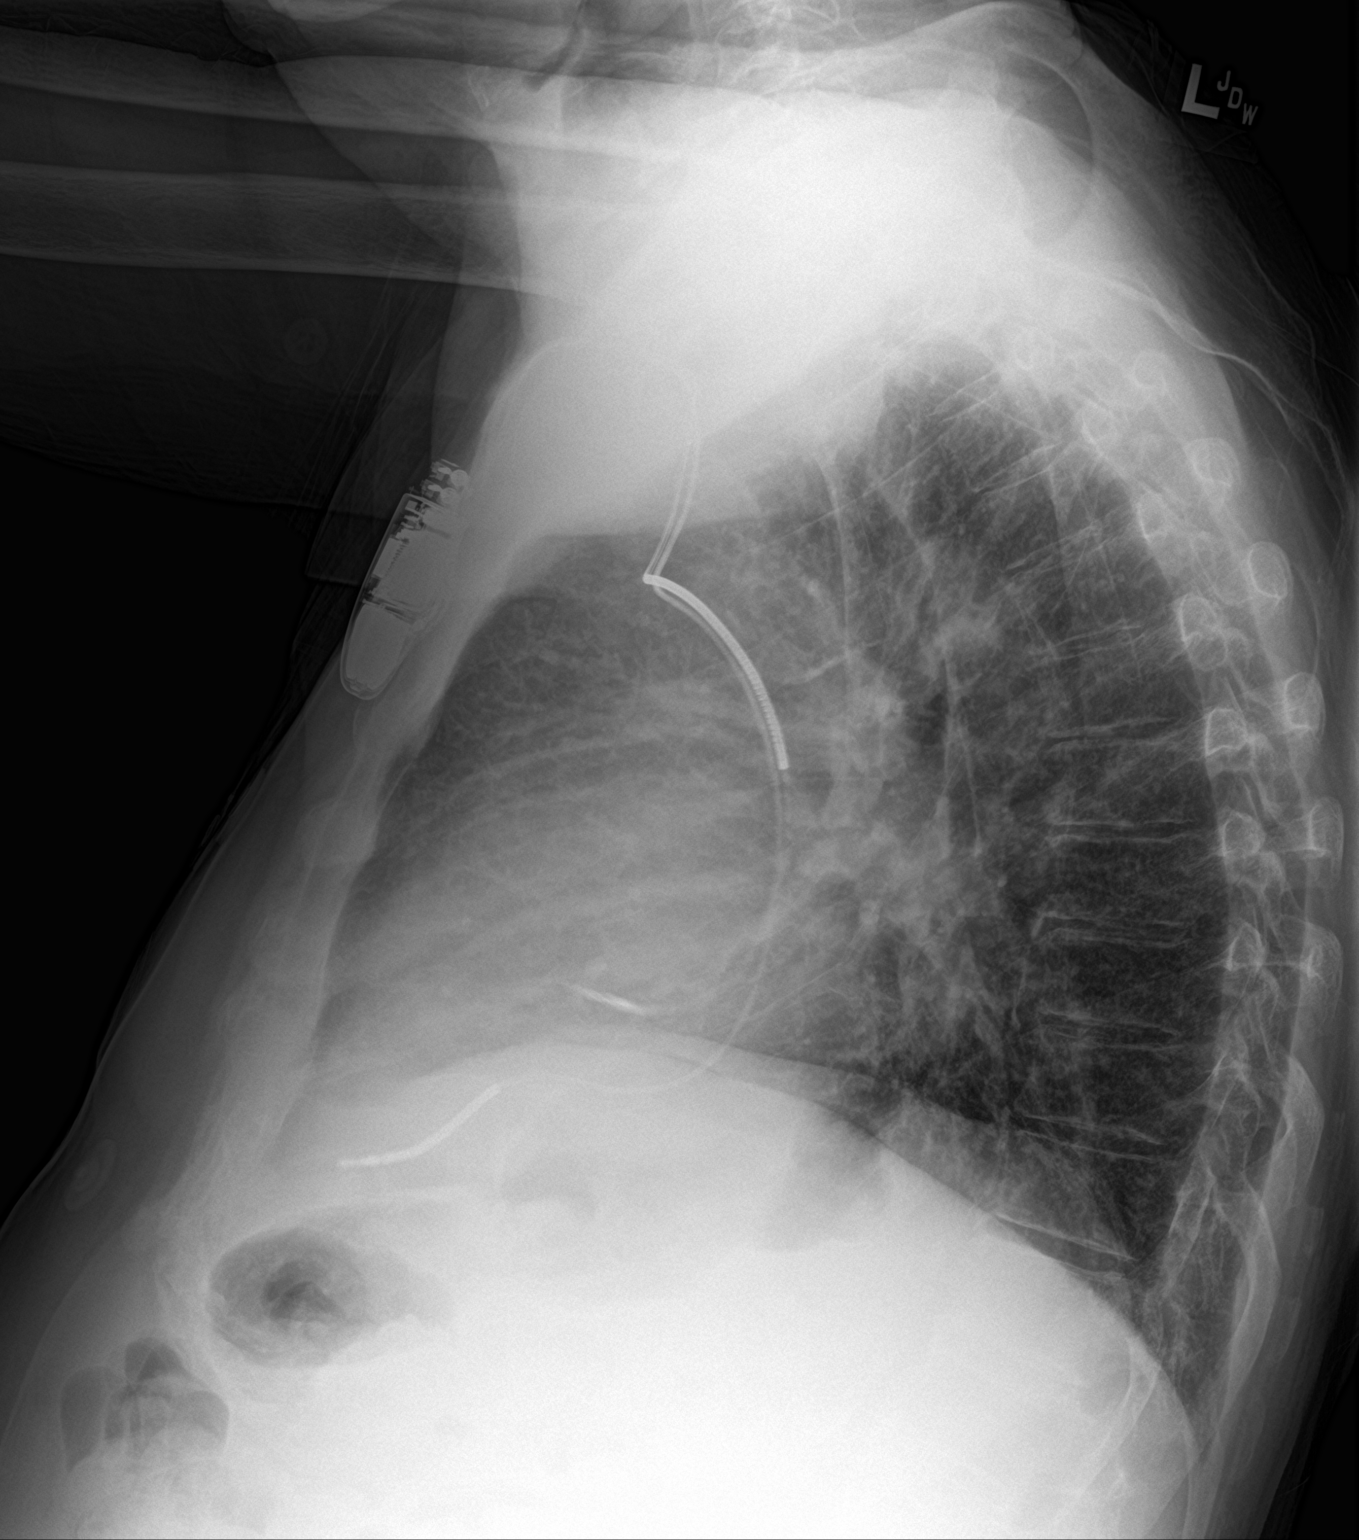

[2 of 2 positions shown; findings below may reference images not displayed]

FINDINGS: LEFT-sided pacemaker overlies stable cardiac silhouette. There is
chronic bronchitic markings. Mild central venous congestion. No
effusion, infiltrate pneumothorax.
IMPRESSION: 1. No significant change.
2. Chronic bronchitic markings and mild central venous congestion.

## 2018-12-04 ENCOUNTER — Ambulatory Visit (INDEPENDENT_AMBULATORY_CARE_PROVIDER_SITE_OTHER): Payer: PPO

## 2018-12-04 DIAGNOSIS — I5022 Chronic systolic (congestive) heart failure: Secondary | ICD-10-CM | POA: Diagnosis not present

## 2018-12-04 DIAGNOSIS — Z95810 Presence of automatic (implantable) cardiac defibrillator: Secondary | ICD-10-CM | POA: Diagnosis not present

## 2018-12-04 DIAGNOSIS — I428 Other cardiomyopathies: Secondary | ICD-10-CM

## 2018-12-04 NOTE — Progress Notes (Signed)
Remote ICD transmission.   

## 2018-12-05 LAB — CUP PACEART REMOTE DEVICE CHECK
Battery Remaining Longevity: 48 mo
Battery Voltage: 2.96 V
Brady Statistic AP VP Percent: 97.8 %
Brady Statistic AP VS Percent: 1.1 %
Brady Statistic AS VP Percent: 1.03 %
Brady Statistic AS VS Percent: 0.08 %
Brady Statistic RA Percent Paced: 98.36 %
Brady Statistic RV Percent Paced: 98.29 %
Date Time Interrogation Session: 20191223072504
HighPow Impedance: 61 Ohm
HighPow Impedance: 83 Ohm
Implantable Lead Implant Date: 20080805
Implantable Lead Implant Date: 20080805
Implantable Lead Implant Date: 20180816
Implantable Lead Location: 753858
Implantable Lead Location: 753859
Implantable Lead Location: 753860
Implantable Lead Model: 4076
Implantable Lead Model: 7001
Implantable Pulse Generator Implant Date: 20180816
Lead Channel Impedance Value: 1140 Ohm
Lead Channel Impedance Value: 1140 Ohm
Lead Channel Impedance Value: 255.093
Lead Channel Impedance Value: 262.946
Lead Channel Impedance Value: 284.683
Lead Channel Impedance Value: 286.508
Lead Channel Impedance Value: 312.507
Lead Channel Impedance Value: 399 Ohm
Lead Channel Impedance Value: 475 Ohm
Lead Channel Impedance Value: 551 Ohm
Lead Channel Impedance Value: 589 Ohm
Lead Channel Impedance Value: 722 Ohm
Lead Channel Impedance Value: 760 Ohm
Lead Channel Impedance Value: 893 Ohm
Lead Channel Impedance Value: 931 Ohm
Lead Channel Impedance Value: 950 Ohm
Lead Channel Pacing Threshold Amplitude: 0.375 V
Lead Channel Pacing Threshold Amplitude: 1.875 V
Lead Channel Pacing Threshold Amplitude: 2.25 V
Lead Channel Pacing Threshold Pulse Width: 0.4 ms
Lead Channel Pacing Threshold Pulse Width: 0.4 ms
Lead Channel Pacing Threshold Pulse Width: 0.8 ms
Lead Channel Sensing Intrinsic Amplitude: 1.875 mV
Lead Channel Sensing Intrinsic Amplitude: 1.875 mV
Lead Channel Sensing Intrinsic Amplitude: 5.875 mV
Lead Channel Sensing Intrinsic Amplitude: 5.875 mV
Lead Channel Setting Pacing Amplitude: 4 V
Lead Channel Setting Pacing Pulse Width: 0.8 ms
Lead Channel Setting Sensing Sensitivity: 1.2 mV
MDC IDC MSMT LEADCHNL LV IMPEDANCE VALUE: 589 Ohm
MDC IDC MSMT LEADCHNL LV IMPEDANCE VALUE: 988 Ohm
MDC IDC SET LEADCHNL LV PACING AMPLITUDE: 3.5 V
MDC IDC SET LEADCHNL RA PACING AMPLITUDE: 2 V
MDC IDC SET LEADCHNL RV PACING PULSEWIDTH: 0.4 ms

## 2018-12-05 NOTE — Progress Notes (Signed)
EPIC Encounter for ICM Monitoring  Patient Name: Cody Gomez is a 84 y.o. male Date: 12/05/2018 Primary Care Physican: Street, Sharon Mt, MD Primary [place] Electrophysiologist: [name] BiV Pacing: 98.3%Effective: 92.1% Last Weight: 201 lbs                                                    Transmission reviewed  Thoracic impedance normal.   Prescribed: Furosemide 40 mg every day. Potassium 20 mEqtake 1 tablet by mouth twice daily.  Labs:  08/31/2018 Creatinine 1.01, BUN 18, Potassium 4.3, Sodium 138, eGFR 68-78 03/03/2018 Creatinine 0.90, BUN 16, Potassium 3.9, Sodium 139, EGFR 78-90  Recommendations: None  Follow-up plan: ICM clinic phone appointment on 01/04/2019.     Copy of ICM check sent to Dr. [name].   3 month ICM trend: 12/04/2018    1 Year ICM trend:       Rosalene Billings, RN 12/05/2018 9:00 AM

## 2018-12-08 NOTE — Patient Outreach (Signed)
Was given a repeat referral that has already been addressed.  [facility].

## 2018-12-18 ENCOUNTER — Other Ambulatory Visit: Payer: Self-pay | Admitting: Internal Medicine

## 2018-12-20 DIAGNOSIS — Z7901 Long term (current) use of anticoagulants: Secondary | ICD-10-CM | POA: Diagnosis not present

## 2018-12-27 DIAGNOSIS — S41112A Laceration without foreign body of left upper arm, initial encounter: Secondary | ICD-10-CM | POA: Diagnosis not present

## 2019-01-04 ENCOUNTER — Ambulatory Visit (INDEPENDENT_AMBULATORY_CARE_PROVIDER_SITE_OTHER): Payer: PPO

## 2019-01-04 DIAGNOSIS — Z95810 Presence of automatic (implantable) cardiac defibrillator: Secondary | ICD-10-CM

## 2019-01-04 DIAGNOSIS — I5022 Chronic systolic (congestive) heart failure: Secondary | ICD-10-CM

## 2019-01-05 ENCOUNTER — Telehealth: Payer: Self-pay

## 2019-01-05 NOTE — Telephone Encounter (Signed)
Remote ICM transmission received.  Attempted call to patient regarding ICM remote transmission and left detailed message, per DPR, with next ICM remote transmission date of 02/06/2019.  Advised to return call for any fluid symptoms or questions.    

## 2019-01-05 NOTE — Progress Notes (Signed)
EPIC Encounter for ICM Monitoring  Patient Name: BODHI MORADI is a 85 y.o. male Date: 01/05/2019 Primary Care Physican: Street, Sharon Mt, MD Primary [place] Electrophysiologist: [name] BiV Pacing: 99%Effective: 94.4% Last Weight:201lbs    Attempted call to patient.  Left detailed message per DPR regarding transmission. Transmission reviewed.   Thoracic impedance normal.   Prescribed:Furosemide 40 mg every day. Potassium 20 mEqtake 1 tablet by mouth twice daily.  Labs:  08/31/2018 Creatinine 1.01, BUN 18, Potassium 4.3, Sodium 138, eGFR 68-78 03/03/2018 Creatinine 0.90, BUN 16, Potassium 3.9, Sodium 139, EGFR 78-90  Recommendations:Left voice mail with ICM number and encouraged to call if experiencing any fluid symptoms.  Follow-up plan: ICM clinic phone appointment on2/25/2020. Office appointment with Dr [name] 03/01/2019  Copy of ICM check sent to [place].    3 month ICM trend: 01/04/2019    1 Year ICM trend:       Rosalene Billings, RN 01/05/2019 5:26 PM

## 2019-01-23 ENCOUNTER — Other Ambulatory Visit: Payer: Self-pay | Admitting: Internal Medicine

## 2019-01-29 DIAGNOSIS — Z7901 Long term (current) use of anticoagulants: Secondary | ICD-10-CM | POA: Diagnosis not present

## 2019-02-06 ENCOUNTER — Ambulatory Visit (INDEPENDENT_AMBULATORY_CARE_PROVIDER_SITE_OTHER): Payer: PPO

## 2019-02-06 DIAGNOSIS — Z95810 Presence of automatic (implantable) cardiac defibrillator: Secondary | ICD-10-CM | POA: Diagnosis not present

## 2019-02-06 DIAGNOSIS — I5022 Chronic systolic (congestive) heart failure: Secondary | ICD-10-CM

## 2019-02-07 NOTE — Progress Notes (Signed)
EPIC Encounter for ICM Monitoring  Patient Name: Cody Gomez is a 85 y.o. male Date: 02/07/2019 Primary Care Physican: Street, Sharon Mt, MD Primary [place] Electrophysiologist: [name] BiV Pacing: 99.3%Effective: 97.3% LastWeight: 199-201lbs    Heart failure questions reviewed and asymptomatic.   Thoracic impedance normal.   Prescribed:Furosemide 40 mg every day. Potassium 20 mEqtake 1 tablet by mouth twice daily.  Labs:  08/31/2018 Creatinine 1.01, BUN 18, Potassium 4.3, Sodium 138, GFR 68-78 03/03/2018 Creatinine 0.90, BUN 16, Potassium 3.9, Sodium 139, GFR 78-90  Recommendations: No changes and encouraged to call if experiencing any fluid symptoms.  Follow-up plan: ICM clinic phone appointment on4/21/2020 since he has defib check in the office scheduled with Dr [name] 03/01/2019.  Copy of ICM check sent to [place].  3 month ICM trend: 02/06/2019    1 Year ICM trend:       Rosalene Billings, RN 02/07/2019 9:10 AM

## 2019-02-20 DIAGNOSIS — Z9989 Dependence on other enabling machines and devices: Secondary | ICD-10-CM | POA: Diagnosis not present

## 2019-02-20 DIAGNOSIS — I420 Dilated cardiomyopathy: Secondary | ICD-10-CM | POA: Diagnosis not present

## 2019-02-20 DIAGNOSIS — J4520 Mild intermittent asthma, uncomplicated: Secondary | ICD-10-CM | POA: Diagnosis not present

## 2019-02-20 DIAGNOSIS — E785 Hyperlipidemia, unspecified: Secondary | ICD-10-CM | POA: Diagnosis not present

## 2019-02-20 DIAGNOSIS — G4733 Obstructive sleep apnea (adult) (pediatric): Secondary | ICD-10-CM | POA: Diagnosis not present

## 2019-02-20 DIAGNOSIS — E669 Obesity, unspecified: Secondary | ICD-10-CM | POA: Diagnosis not present

## 2019-02-20 DIAGNOSIS — K219 Gastro-esophageal reflux disease without esophagitis: Secondary | ICD-10-CM | POA: Diagnosis not present

## 2019-02-20 DIAGNOSIS — E890 Postprocedural hypothyroidism: Secondary | ICD-10-CM | POA: Diagnosis not present

## 2019-02-20 DIAGNOSIS — Z0000 Encounter for general adult medical examination without abnormal findings: Secondary | ICD-10-CM | POA: Diagnosis not present

## 2019-02-20 DIAGNOSIS — Z7901 Long term (current) use of anticoagulants: Secondary | ICD-10-CM | POA: Diagnosis not present

## 2019-02-20 DIAGNOSIS — Y708 Miscellaneous anesthesiology devices associated with adverse incidents, not elsewhere classified: Secondary | ICD-10-CM | POA: Diagnosis not present

## 2019-02-20 DIAGNOSIS — I10 Essential (primary) hypertension: Secondary | ICD-10-CM | POA: Diagnosis not present

## 2019-02-21 DIAGNOSIS — Z7901 Long term (current) use of anticoagulants: Secondary | ICD-10-CM | POA: Diagnosis not present

## 2019-02-21 DIAGNOSIS — E785 Hyperlipidemia, unspecified: Secondary | ICD-10-CM | POA: Diagnosis not present

## 2019-02-26 ENCOUNTER — Telehealth: Payer: Self-pay

## 2019-02-26 MED ORDER — POTASSIUM CHLORIDE CRYS ER 20 MEQ PO TBCR: EXTENDED_RELEASE_TABLET | ORAL | 3 refills | Status: AC

## 2019-02-26 NOTE — Telephone Encounter (Signed)
Call to patient as requested by Dr Olin Pia nurse to determine if patient is retaining fluid.  Reviewed transmission and advised to appears that he had some fluid retention a couple of days ago but it is back at baseline today.  He said his breathing is at baseline today and feels okay. Weight stable at 199 lbs.  Advised I will let Dr Olin Pia nurse know that report is normal today.  Next ICM remote transmission scheduled for 03/19/2019.   02/26/2019 [employer]

## 2019-02-26 NOTE — Telephone Encounter (Signed)
Per pt, he is okay with rescheduling his yearly follow up. He states he feel well with occasional SOB. He denies weight gain.   He does need a refill for K+, which was filled. I advised him I would message Sharman Cheek to assess fluid status since he is in the [facility] clinic. He has verbalized understanding and has no additional questions.

## 2019-03-01 ENCOUNTER — Encounter: Payer: PPO | Admitting: Internal Medicine

## 2019-03-01 DIAGNOSIS — Z7901 Long term (current) use of anticoagulants: Secondary | ICD-10-CM | POA: Diagnosis not present

## 2019-03-04 IMAGING — CR DG CHEST 2V
2 series · 2 of 2 positions shown · non-contrast
Comparison: CT chest dated 04/25/2017

CLINICAL DATA: Asthma

EXAM:
CHEST  2 VIEW

[w chest pa]
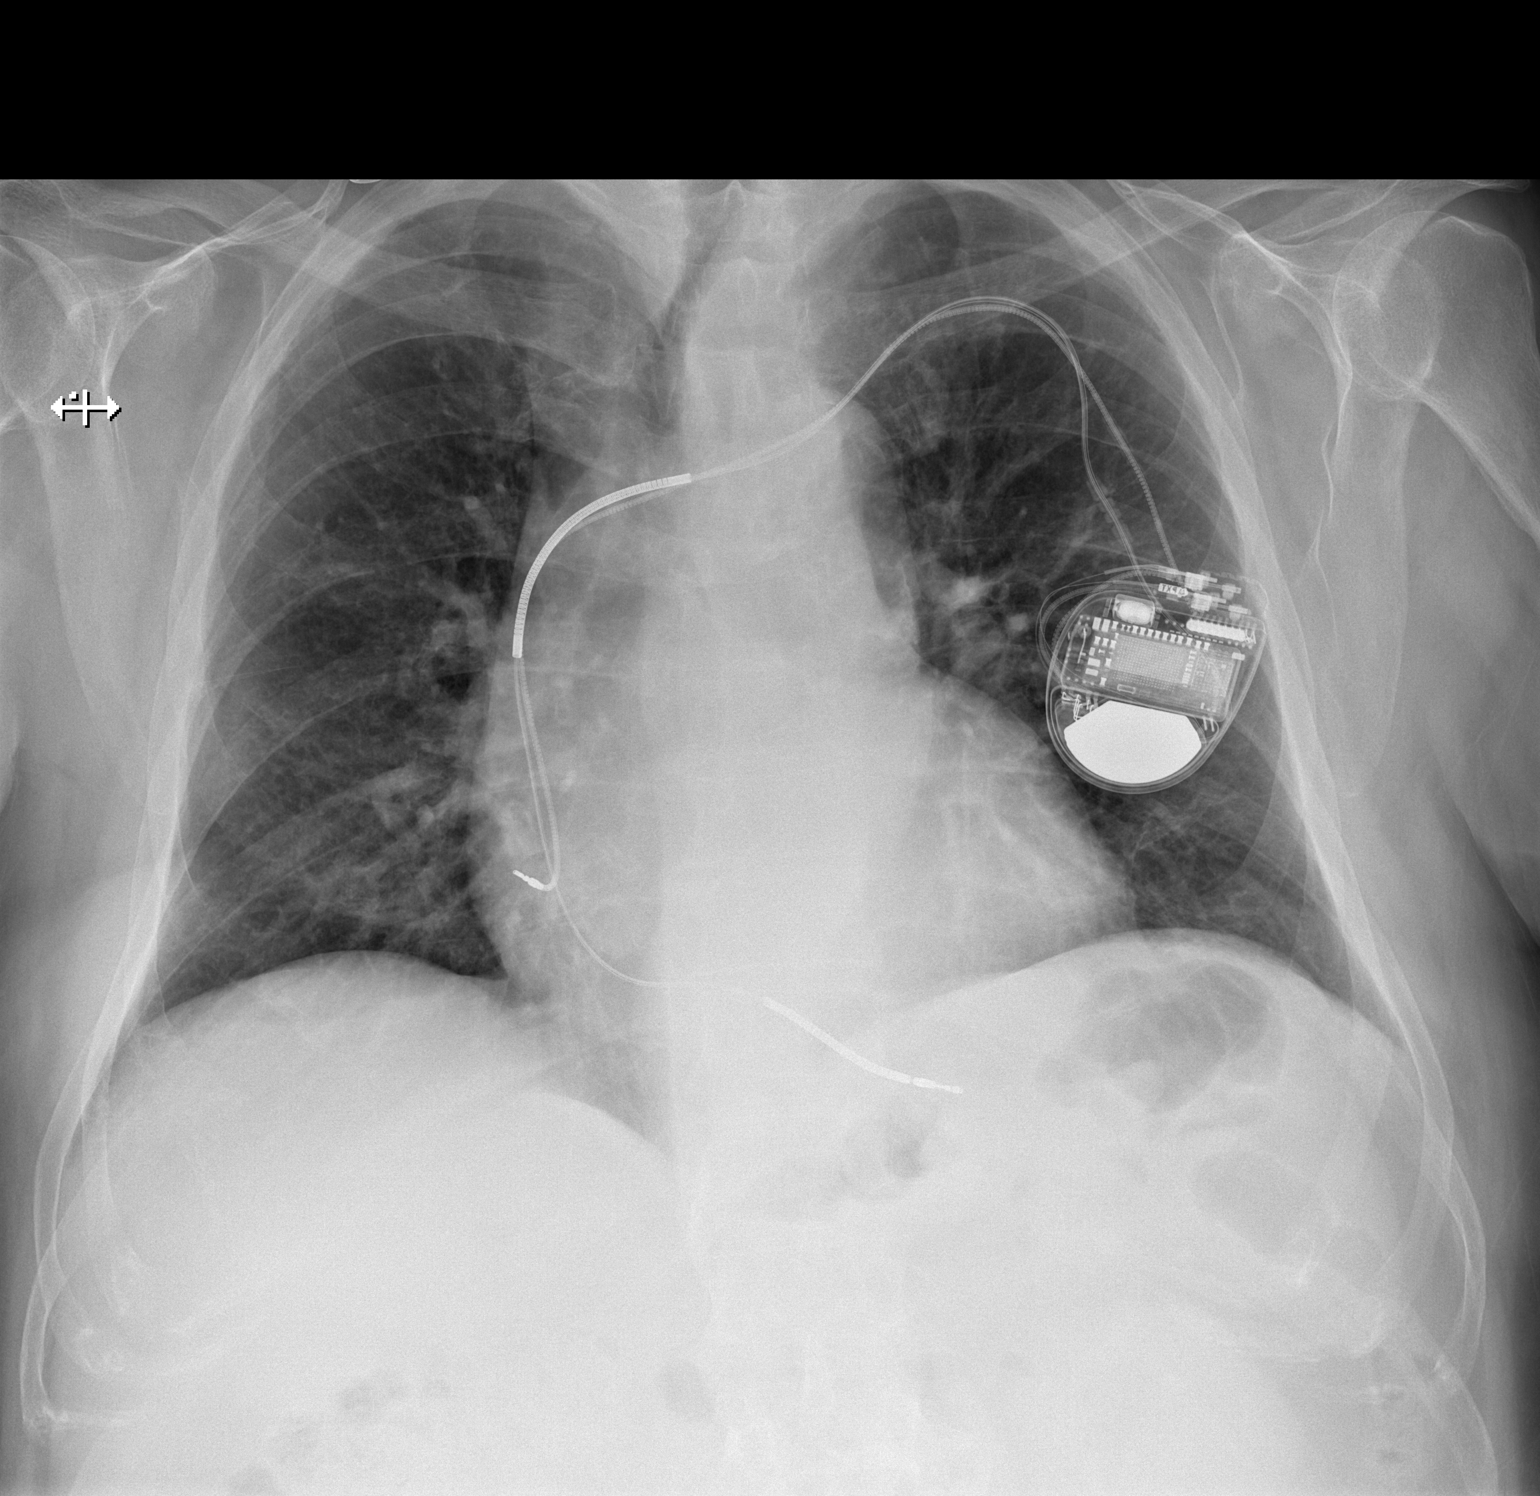

[w chest lat]
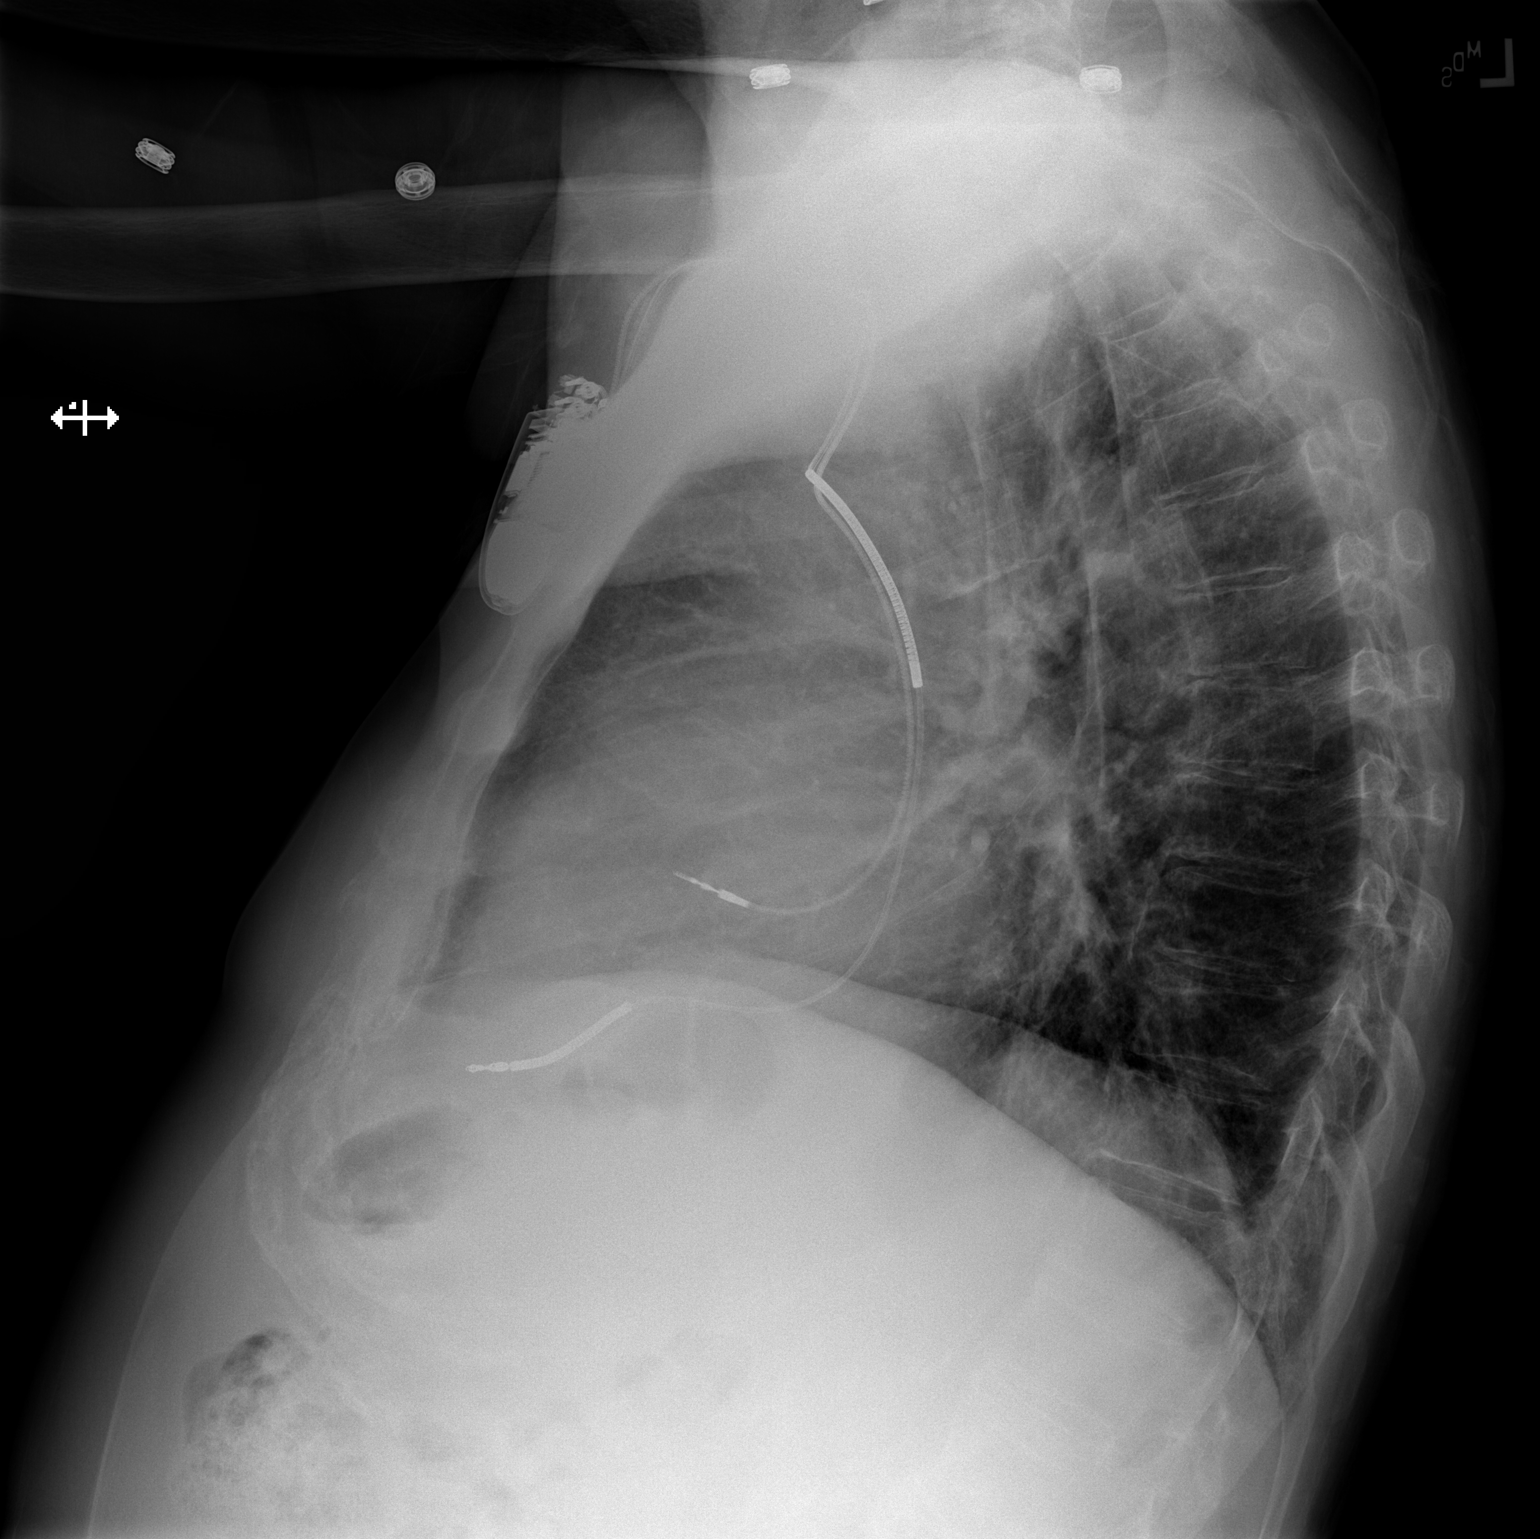

[2 of 2 positions shown; findings below may reference images not displayed]

FINDINGS: Lungs are clear.  No pleural effusion or pneumothorax.

Mild cardiomegaly.  Left subclavian ICD.

Degenerative changes of the visualized thoracolumbar spine.
IMPRESSION: No evidence of acute cardiopulmonary disease.

## 2019-03-05 ENCOUNTER — Ambulatory Visit (INDEPENDENT_AMBULATORY_CARE_PROVIDER_SITE_OTHER): Payer: PPO | Admitting: *Deleted

## 2019-03-05 ENCOUNTER — Other Ambulatory Visit: Payer: Self-pay

## 2019-03-05 DIAGNOSIS — I5022 Chronic systolic (congestive) heart failure: Secondary | ICD-10-CM

## 2019-03-05 DIAGNOSIS — I428 Other cardiomyopathies: Secondary | ICD-10-CM

## 2019-03-06 LAB — CUP PACEART REMOTE DEVICE CHECK
Battery Remaining Longevity: 47 mo
Battery Voltage: 2.96 V
Brady Statistic AP VP Percent: 99.05 %
Brady Statistic AP VS Percent: 0.07 %
Brady Statistic AS VP Percent: 0.88 %
Brady Statistic AS VS Percent: 0.01 %
Brady Statistic RA Percent Paced: 99.03 %
Brady Statistic RV Percent Paced: 99.83 %
Date Time Interrogation Session: 20200323052306
HighPow Impedance: 62 Ohm
HighPow Impedance: 79 Ohm
Implantable Lead Implant Date: 20080805
Implantable Lead Implant Date: 20080805
Implantable Lead Implant Date: 20180816
Implantable Lead Location: 753858
Implantable Lead Location: 753859
Implantable Lead Location: 753860
Implantable Lead Model: 4076
Implantable Pulse Generator Implant Date: 20180816
Lead Channel Impedance Value: 1064 Ohm
Lead Channel Impedance Value: 1140 Ohm
Lead Channel Impedance Value: 265.661
Lead Channel Impedance Value: 275.5 Ohm
Lead Channel Impedance Value: 306.269
Lead Channel Impedance Value: 319.42 Ohm
Lead Channel Impedance Value: 513 Ohm
Lead Channel Impedance Value: 551 Ohm
Lead Channel Impedance Value: 551 Ohm
Lead Channel Impedance Value: 589 Ohm
Lead Channel Impedance Value: 722 Ohm
Lead Channel Impedance Value: 760 Ohm
Lead Channel Impedance Value: 893 Ohm
Lead Channel Impedance Value: 931 Ohm
Lead Channel Impedance Value: 931 Ohm
Lead Channel Impedance Value: 988 Ohm
Lead Channel Pacing Threshold Amplitude: 0.375 V
Lead Channel Pacing Threshold Amplitude: 2.125 V
Lead Channel Pacing Threshold Pulse Width: 0.4 ms
Lead Channel Pacing Threshold Pulse Width: 0.8 ms
Lead Channel Sensing Intrinsic Amplitude: 2.625 mV
Lead Channel Sensing Intrinsic Amplitude: 2.625 mV
Lead Channel Sensing Intrinsic Amplitude: 7.625 mV
Lead Channel Sensing Intrinsic Amplitude: 7.625 mV
Lead Channel Setting Pacing Amplitude: 2 V
Lead Channel Setting Pacing Amplitude: 3.5 V
Lead Channel Setting Pacing Amplitude: 3.5 V
Lead Channel Setting Pacing Pulse Width: 0.4 ms
Lead Channel Setting Pacing Pulse Width: 0.8 ms
Lead Channel Setting Sensing Sensitivity: 1.2 mV
MDC IDC MSMT LEADCHNL LV IMPEDANCE VALUE: 265.661
MDC IDC MSMT LEADCHNL RA IMPEDANCE VALUE: 399 Ohm
MDC IDC MSMT LEADCHNL RV PACING THRESHOLD AMPLITUDE: 1.75 V
MDC IDC MSMT LEADCHNL RV PACING THRESHOLD PULSEWIDTH: 0.4 ms

## 2019-03-13 ENCOUNTER — Telehealth: Payer: Self-pay

## 2019-03-13 NOTE — Telephone Encounter (Signed)
I called and spoke with patient, he does not have access to the internet, smart phone, or tablet. He states that he is doing well and does not have any issues to discuss right now. I have put in a 3 month recall. Patient will call if he needs anything.

## 2019-03-14 NOTE — Progress Notes (Signed)
Remote ICD transmission.   

## 2019-03-19 ENCOUNTER — Other Ambulatory Visit: Payer: Self-pay

## 2019-03-19 ENCOUNTER — Ambulatory Visit (INDEPENDENT_AMBULATORY_CARE_PROVIDER_SITE_OTHER): Payer: PPO

## 2019-03-19 DIAGNOSIS — I5022 Chronic systolic (congestive) heart failure: Secondary | ICD-10-CM | POA: Diagnosis not present

## 2019-03-19 DIAGNOSIS — Z95810 Presence of automatic (implantable) cardiac defibrillator: Secondary | ICD-10-CM | POA: Diagnosis not present

## 2019-03-20 NOTE — Progress Notes (Signed)
EPIC Encounter for ICM Monitoring  Patient Name: RACHAEL ZAPANTA is a 85 y.o. male Date: 03/20/2019 Primary Care Physican: Street, Sharon Mt, MD Primary [place] Electrophysiologist: [name] BiV Pacing: 99.7%Effective: 99.3% LastWeight: 199-201lbs    Heart failure questions reviewed and asymptomatic.   Thoracic impedance normal.   Prescribed:Furosemide 40 mg every day. Potassium 20 mEqtake 1 tablet by mouth twice daily.  Labs:  08/31/2018 Creatinine 1.01, BUN 18, Potassium 4.3, Sodium 138, GFR 68-78 03/03/2018 Creatinine 0.90, BUN 16, Potassium 3.9, Sodium 139, GFR 78-90  Recommendations: No changes and encouraged to call if experiencing any fluid symptoms.  Follow-up plan: ICM clinic phone appointment on5/10/2019.  Copy of ICM check sent to [place].  3 month ICM trend: 03/19/2019    1 Year ICM trend:       Rosalene Billings, RN 03/20/2019 2:47 PM

## 2019-04-04 ENCOUNTER — Telehealth: Payer: Self-pay | Admitting: Internal Medicine

## 2019-04-04 NOTE — Telephone Encounter (Signed)
New Message    1. Has your device fired? no . Is you device beeping? yes  3. Are you experiencing draining or swelling at device site? no 4. Are you calling to see if we received your device transmission? no 5. Have you passed out? Yes He said it sounds like an alarm going off.     Please route to [place]

## 2019-04-04 NOTE — Telephone Encounter (Signed)
Transmission reviewed. Alert is for SVC defib impedance of 102ohms (threshold 100ohms). Patient has a SJM 7001 Riata lead implanted in 2008. All other lead trends stable. Discussed with Dr. Barnetta Hammersmith review literature. Will likely need to program SVC coil off. Overnight risk of needing ICD therapy is low. Plan to update pt with recommendations tomorrow.  Discussed with patient and son. Reviewed shock plan. They verbalize understanding of shock plan and Dr. Olin Pia recommendations. Pt and son aware that alert tone will continue every 6hrs until pt's device is reprogrammed. They deny additional questions or concerns at this time.

## 2019-04-04 NOTE — Telephone Encounter (Signed)
Spoke with patient. He denies syncope. Feels fine, no ShOB, chest discomfort, or other symptoms. He has heard his ICD alert tone x2 today. Walked patient through manual transmission steps. Advised I will call back in a few minutes after his transmission is received. Pt is agreeable to this plan.

## 2019-04-05 NOTE — Telephone Encounter (Signed)
Per Dr. [name], plan to see patient on Monday, 04/09/19 in [place] to program SVC off. Pt aware and agreeable to appointment at 2:30pm. He is aware that he will continue to hear alert tone until that appointment. He denies questions at this time and thanked me for my call.

## 2019-04-06 DIAGNOSIS — Z7901 Long term (current) use of anticoagulants: Secondary | ICD-10-CM | POA: Diagnosis not present

## 2019-04-09 ENCOUNTER — Other Ambulatory Visit: Payer: Self-pay

## 2019-04-09 ENCOUNTER — Ambulatory Visit (INDEPENDENT_AMBULATORY_CARE_PROVIDER_SITE_OTHER): Payer: PPO | Admitting: Student

## 2019-04-09 DIAGNOSIS — I428 Other cardiomyopathies: Secondary | ICD-10-CM

## 2019-04-09 DIAGNOSIS — I472 Ventricular tachycardia, unspecified: Secondary | ICD-10-CM

## 2019-04-09 DIAGNOSIS — I5022 Chronic systolic (congestive) heart failure: Secondary | ICD-10-CM | POA: Diagnosis not present

## 2019-04-09 DIAGNOSIS — Z95810 Presence of automatic (implantable) cardiac defibrillator: Secondary | ICD-10-CM

## 2019-04-09 LAB — CUP PACEART INCLINIC DEVICE CHECK
Battery Remaining Longevity: 47 mo
Battery Voltage: 2.93 V
Brady Statistic AP VP Percent: 97.76 %
Brady Statistic AP VS Percent: 0.6 %
Brady Statistic AS VP Percent: 1.56 %
Brady Statistic AS VS Percent: 0.08 %
Brady Statistic RA Percent Paced: 97.61 %
Brady Statistic RV Percent Paced: 98.49 %
Date Time Interrogation Session: 20200427153554
HighPow Impedance: 66 Ohm
HighPow Impedance: 91 Ohm
Implantable Lead Implant Date: 20080805
Implantable Lead Implant Date: 20080805
Implantable Lead Implant Date: 20180816
Implantable Lead Location: 753858
Implantable Lead Location: 753859
Implantable Lead Location: 753860
Implantable Lead Model: 4076
Implantable Lead Model: 7001
Implantable Pulse Generator Implant Date: 20180816
Lead Channel Impedance Value: 1007 Ohm
Lead Channel Impedance Value: 1064 Ohm
Lead Channel Impedance Value: 1178 Ohm
Lead Channel Impedance Value: 1235 Ohm
Lead Channel Impedance Value: 285.934
Lead Channel Impedance Value: 285.934
Lead Channel Impedance Value: 299.908
Lead Channel Impedance Value: 323 Ohm
Lead Channel Impedance Value: 340.944
Lead Channel Impedance Value: 418 Ohm
Lead Channel Impedance Value: 513 Ohm
Lead Channel Impedance Value: 589 Ohm
Lead Channel Impedance Value: 646 Ohm
Lead Channel Impedance Value: 646 Ohm
Lead Channel Impedance Value: 722 Ohm
Lead Channel Impedance Value: 722 Ohm
Lead Channel Impedance Value: 950 Ohm
Lead Channel Impedance Value: 988 Ohm
Lead Channel Pacing Threshold Amplitude: 0.375 V
Lead Channel Pacing Threshold Amplitude: 1.75 V
Lead Channel Pacing Threshold Amplitude: 2.125 V
Lead Channel Pacing Threshold Pulse Width: 0.4 ms
Lead Channel Pacing Threshold Pulse Width: 0.4 ms
Lead Channel Pacing Threshold Pulse Width: 0.8 ms
Lead Channel Sensing Intrinsic Amplitude: 1.25 mV
Lead Channel Sensing Intrinsic Amplitude: 2.625 mV
Lead Channel Sensing Intrinsic Amplitude: 7.625 mV
Lead Channel Sensing Intrinsic Amplitude: 7.625 mV
Lead Channel Setting Pacing Amplitude: 1.5 V
Lead Channel Setting Pacing Amplitude: 3.5 V
Lead Channel Setting Pacing Amplitude: 3.5 V
Lead Channel Setting Pacing Pulse Width: 0.4 ms
Lead Channel Setting Pacing Pulse Width: 0.8 ms
Lead Channel Setting Sensing Sensitivity: 1.2 mV

## 2019-04-09 NOTE — Progress Notes (Signed)
CRT-D device check in office with device alarms due to SVC defib impedence of 102 ohms. Previously discussed and reviewed plan with Dr. [name]. Thresholds and sensing consistent with previous device measurements. RA, RV, and LV lead impedance trends stable over time. No mode switch episodes recorded. No ventricular arrhythmia episodes recorded. Patient bi-ventricularly pacing 98.5% of the time. Device programmed with appropriate safety margins. Heart failure diagnostics reviewed and trends are stable for patient. SVC defib impedence alerts turned off. Reprogrammed SVC coil off. Atrial amplitude changed to 1.5 V. Estimated longevity 3 yr and 11 months.  Patient enrolled in remote follow up. Will see Dr. [name] in follow up as scheduled.   Legrand Como [address] [place], [place] 04/09/2019 3:36 PM

## 2019-04-19 ENCOUNTER — Telehealth: Payer: Self-pay

## 2019-04-19 NOTE — Telephone Encounter (Signed)
Pt states that his ICD has been going off again. Pt do not thinks he been shock. The pt did try to send a transmission but received the error code 3230. I advised the pt to let the handle charge for 30 minutes and try again. The pt agreed. I told him if the nurse did not call him today she will call him in the morning.

## 2019-04-20 ENCOUNTER — Ambulatory Visit (INDEPENDENT_AMBULATORY_CARE_PROVIDER_SITE_OTHER): Payer: PPO | Admitting: *Deleted

## 2019-04-20 ENCOUNTER — Other Ambulatory Visit: Payer: Self-pay

## 2019-04-20 DIAGNOSIS — I472 Ventricular tachycardia, unspecified: Secondary | ICD-10-CM

## 2019-04-20 DIAGNOSIS — Z95810 Presence of automatic (implantable) cardiac defibrillator: Secondary | ICD-10-CM | POA: Diagnosis not present

## 2019-04-20 DIAGNOSIS — I442 Atrioventricular block, complete: Secondary | ICD-10-CM

## 2019-04-20 LAB — CUP PACEART INCLINIC DEVICE CHECK
Battery Remaining Longevity: 44 mo
Battery Voltage: 2.92 V
Brady Statistic AP VP Percent: 98.81 %
Brady Statistic AP VS Percent: 0.4 %
Brady Statistic AS VP Percent: 0.76 %
Brady Statistic AS VS Percent: 0.02 %
Brady Statistic RA Percent Paced: 98.68 %
Brady Statistic RV Percent Paced: 98.83 %
Date Time Interrogation Session: 20200508175808
HighPow Impedance: 98 Ohm
HighPow Impedance: 99 Ohm
Implantable Lead Implant Date: 20080805
Implantable Lead Implant Date: 20080805
Implantable Lead Implant Date: 20180816
Implantable Lead Location: 753858
Implantable Lead Location: 753859
Implantable Lead Location: 753860
Implantable Lead Model: 4076
Implantable Lead Model: 7001
Implantable Pulse Generator Implant Date: 20180816
Lead Channel Impedance Value: 1007 Ohm
Lead Channel Impedance Value: 1007 Ohm
Lead Channel Impedance Value: 1254 Ohm
Lead Channel Impedance Value: 1254 Ohm
Lead Channel Impedance Value: 274.19 Ohm
Lead Channel Impedance Value: 278.237
Lead Channel Impedance Value: 299.175
Lead Channel Impedance Value: 309.309
Lead Channel Impedance Value: 335.403
Lead Channel Impedance Value: 418 Ohm
Lead Channel Impedance Value: 513 Ohm
Lead Channel Impedance Value: 589 Ohm
Lead Channel Impedance Value: 608 Ohm
Lead Channel Impedance Value: 608 Ohm
Lead Channel Impedance Value: 722 Ohm
Lead Channel Impedance Value: 779 Ohm
Lead Channel Impedance Value: 931 Ohm
Lead Channel Impedance Value: 988 Ohm
Lead Channel Pacing Threshold Amplitude: 1.25 V
Lead Channel Pacing Threshold Pulse Width: 0.8 ms
Lead Channel Sensing Intrinsic Amplitude: 3.125 mV
Lead Channel Setting Pacing Amplitude: 1.5 V
Lead Channel Setting Pacing Amplitude: 2.5 V
Lead Channel Setting Pacing Amplitude: 3.5 V
Lead Channel Setting Pacing Pulse Width: 0.8 ms
Lead Channel Setting Pacing Pulse Width: 0.8 ms
Lead Channel Setting Sensing Sensitivity: 1.2 mV

## 2019-04-20 NOTE — Telephone Encounter (Signed)
Spoke with patient. Plan for appointment today at 3:00pm in the [facility]. Will adjust parameters for RV defib impedance.   Since reprogramming SVC off on 04/09/19, RV defib impedance trends stable in the 75-100 ohms range. RV defib impedance measured 101 ohms on 04/19/19, alert is programmed for >100 ohms. As of 04/04/19 alert for SVC impedance of 102 ohms, RV defib impedance was 70 ohms.

## 2019-04-20 NOTE — Telephone Encounter (Signed)
Patient called back. He was able to send transmission last night. Transmission received. He stated that the alarm is going off every 4 hours. He states that he feels fine. Reports no pain.

## 2019-04-20 NOTE — Progress Notes (Signed)
CRT-D device check in office, added-on for RV defib impedance warning. Alert triggered for RV defib impedance of 101 ohms, measures 98 ohms in clinic today. Max RV defib impedance increased to 130 ohms per Dr. [name]. SVC previously programmed off due to elevated defib impedance. RV pacing impedance and threshold consistent with previous device measurements; RV output reprogrammed to 2.5V @ 0.62ms to improve battery longevity (increased to 3.8 years). RA and LV thresholds not tested today, lead impedance trends stable over time. No mode switch episodes recorded. 1 VT-NS episode. Patient bi-ventricularly pacing 98.8% of the time (effective 97.3%). Device programmed with appropriate safety margins. Pt aware to call if auditory alert heard again. Patient enrolled in remote follow up. Patient education completed including shock plan. [facility] on 06/04/19 and ROV with Dr. [name] in 05/2019.

## 2019-04-22 ENCOUNTER — Other Ambulatory Visit: Payer: Self-pay | Admitting: Internal Medicine

## 2019-04-23 ENCOUNTER — Ambulatory Visit (INDEPENDENT_AMBULATORY_CARE_PROVIDER_SITE_OTHER): Payer: PPO

## 2019-04-23 ENCOUNTER — Other Ambulatory Visit: Payer: Self-pay

## 2019-04-23 DIAGNOSIS — Z95810 Presence of automatic (implantable) cardiac defibrillator: Secondary | ICD-10-CM

## 2019-04-23 DIAGNOSIS — I5022 Chronic systolic (congestive) heart failure: Secondary | ICD-10-CM

## 2019-04-23 NOTE — Telephone Encounter (Signed)
Noted  

## 2019-04-25 NOTE — Progress Notes (Signed)
EPIC Encounter for ICM Monitoring  Patient Name: Cody Gomez is a 85 y.o. male Date: 04/25/2019 Primary Care Physican: Street, Sharon Mt, MD Primary [place] Electrophysiologist: [name] BiV Pacing: 98.5%Effective: 93.1% LastWeight: 199-201lbs    Heart failure questions reviewed and asymptomatic. Patient in office due to alert triggered for RV defib impedance and followed up in device clinic Levander Campion, RN device clinic.  Optivol Thoracic impedance normal.   Prescribed:Furosemide 40 mg every day. Potassium 20 mEqtake 1 tablet by mouth twice daily.  Labs:  08/31/2018 Creatinine 1.01, BUN 18, Potassium 4.3, Sodium 138, GFR 68-78 03/03/2018 Creatinine 0.90, BUN 16, Potassium 3.9, Sodium 139, GFR 78-90  Recommendations: No changesand encouraged to call if experiencing any fluid symptoms.  Follow-up plan: ICM clinic phone appointment on6/23/2020.  Copy of ICM check sent to [place].  3 month ICM trend: 04/23/2019    1 Year ICM trend:       Rosalene Billings, RN 04/25/2019 12:02 PM

## 2019-04-26 ENCOUNTER — Telehealth: Payer: Self-pay | Admitting: Internal Medicine

## 2019-04-26 MED ORDER — FLUTICASONE FUROATE-VILANTEROL 200-25 MCG/INH IN AEPB: 1.0000 | INHALATION_SPRAY | Freq: Every day | RESPIRATORY_TRACT | 1 refills | Status: AC

## 2019-04-26 NOTE — Telephone Encounter (Signed)
Called and spoke with Patient.  Patient requested Breo refill to be sent to [facility]. Breo refill sent to requested pharmacy.  Patient aware he will need a follow up visit with Dr Chase Caller once he returns to office.  Nothing further at this time.

## 2019-05-14 DIAGNOSIS — R0781 Pleurodynia: Secondary | ICD-10-CM | POA: Diagnosis not present

## 2019-05-14 DIAGNOSIS — Z5181 Encounter for therapeutic drug level monitoring: Secondary | ICD-10-CM | POA: Diagnosis not present

## 2019-05-14 DIAGNOSIS — Z7901 Long term (current) use of anticoagulants: Secondary | ICD-10-CM | POA: Diagnosis not present

## 2019-05-14 DIAGNOSIS — R3129 Other microscopic hematuria: Secondary | ICD-10-CM | POA: Diagnosis not present

## 2019-05-17 DIAGNOSIS — M9903 Segmental and somatic dysfunction of lumbar region: Secondary | ICD-10-CM | POA: Diagnosis not present

## 2019-05-17 DIAGNOSIS — M9901 Segmental and somatic dysfunction of cervical region: Secondary | ICD-10-CM | POA: Diagnosis not present

## 2019-05-17 DIAGNOSIS — M545 Low back pain: Secondary | ICD-10-CM | POA: Diagnosis not present

## 2019-05-17 DIAGNOSIS — M9902 Segmental and somatic dysfunction of thoracic region: Secondary | ICD-10-CM | POA: Diagnosis not present

## 2019-05-17 DIAGNOSIS — M542 Cervicalgia: Secondary | ICD-10-CM | POA: Diagnosis not present

## 2019-05-21 DIAGNOSIS — M9902 Segmental and somatic dysfunction of thoracic region: Secondary | ICD-10-CM | POA: Diagnosis not present

## 2019-05-21 DIAGNOSIS — M9903 Segmental and somatic dysfunction of lumbar region: Secondary | ICD-10-CM | POA: Diagnosis not present

## 2019-05-21 DIAGNOSIS — M545 Low back pain: Secondary | ICD-10-CM | POA: Diagnosis not present

## 2019-05-21 DIAGNOSIS — M9901 Segmental and somatic dysfunction of cervical region: Secondary | ICD-10-CM | POA: Diagnosis not present

## 2019-05-21 DIAGNOSIS — M542 Cervicalgia: Secondary | ICD-10-CM | POA: Diagnosis not present

## 2019-05-24 DIAGNOSIS — M549 Dorsalgia, unspecified: Secondary | ICD-10-CM | POA: Diagnosis not present

## 2019-05-24 DIAGNOSIS — E669 Obesity, unspecified: Secondary | ICD-10-CM | POA: Diagnosis not present

## 2019-05-24 DIAGNOSIS — R531 Weakness: Secondary | ICD-10-CM | POA: Diagnosis not present

## 2019-05-24 DIAGNOSIS — Z6830 Body mass index (BMI) 30.0-30.9, adult: Secondary | ICD-10-CM | POA: Diagnosis not present

## 2019-05-25 ENCOUNTER — Telehealth: Payer: Self-pay

## 2019-05-25 NOTE — Telephone Encounter (Signed)
Returned call to patient as requested by voice mail message.  He repots he does not have appetite, overall weakness, trouble getting up out of chair and using walker/cane for ambulation.  He had a PCP office visit yesterday and discussed all the symptoms but today not feeling any better.  He is having difficulty walking and very weak.   He was given prednisone but not feeling any better today.  Advised to call 911 so he can have ED evaluate him. He agreed if he is not feeling better in a few hours he will call 911.

## 2019-05-26 DIAGNOSIS — Z87891 Personal history of nicotine dependence: Secondary | ICD-10-CM | POA: Diagnosis not present

## 2019-05-26 DIAGNOSIS — E039 Hypothyroidism, unspecified: Secondary | ICD-10-CM | POA: Diagnosis not present

## 2019-05-26 DIAGNOSIS — J45909 Unspecified asthma, uncomplicated: Secondary | ICD-10-CM | POA: Diagnosis not present

## 2019-05-26 DIAGNOSIS — Z95810 Presence of automatic (implantable) cardiac defibrillator: Secondary | ICD-10-CM | POA: Diagnosis not present

## 2019-05-26 DIAGNOSIS — I4510 Unspecified right bundle-branch block: Secondary | ICD-10-CM | POA: Diagnosis not present

## 2019-05-26 DIAGNOSIS — S0990XA Unspecified injury of head, initial encounter: Secondary | ICD-10-CM | POA: Diagnosis not present

## 2019-05-26 DIAGNOSIS — R531 Weakness: Secondary | ICD-10-CM | POA: Diagnosis not present

## 2019-05-26 DIAGNOSIS — D320 Benign neoplasm of cerebral meninges: Secondary | ICD-10-CM | POA: Diagnosis not present

## 2019-05-26 DIAGNOSIS — R52 Pain, unspecified: Secondary | ICD-10-CM | POA: Diagnosis not present

## 2019-05-26 DIAGNOSIS — E876 Hypokalemia: Secondary | ICD-10-CM | POA: Diagnosis not present

## 2019-05-26 DIAGNOSIS — J9601 Acute respiratory failure with hypoxia: Secondary | ICD-10-CM | POA: Diagnosis not present

## 2019-05-26 DIAGNOSIS — C9000 Multiple myeloma not having achieved remission: Secondary | ICD-10-CM | POA: Diagnosis not present

## 2019-05-26 DIAGNOSIS — W19XXXA Unspecified fall, initial encounter: Secondary | ICD-10-CM | POA: Diagnosis not present

## 2019-05-26 DIAGNOSIS — R791 Abnormal coagulation profile: Secondary | ICD-10-CM | POA: Diagnosis not present

## 2019-05-26 DIAGNOSIS — R4781 Slurred speech: Secondary | ICD-10-CM | POA: Diagnosis not present

## 2019-05-26 DIAGNOSIS — I252 Old myocardial infarction: Secondary | ICD-10-CM | POA: Diagnosis not present

## 2019-05-26 DIAGNOSIS — R319 Hematuria, unspecified: Secondary | ICD-10-CM | POA: Diagnosis not present

## 2019-05-26 DIAGNOSIS — Z66 Do not resuscitate: Secondary | ICD-10-CM | POA: Diagnosis not present

## 2019-05-26 DIAGNOSIS — K8020 Calculus of gallbladder without cholecystitis without obstruction: Secondary | ICD-10-CM | POA: Diagnosis not present

## 2019-05-26 DIAGNOSIS — M50323 Other cervical disc degeneration at C6-C7 level: Secondary | ICD-10-CM | POA: Diagnosis not present

## 2019-05-26 DIAGNOSIS — N179 Acute kidney failure, unspecified: Secondary | ICD-10-CM | POA: Diagnosis not present

## 2019-05-26 DIAGNOSIS — T45515A Adverse effect of anticoagulants, initial encounter: Secondary | ICD-10-CM | POA: Diagnosis not present

## 2019-05-26 DIAGNOSIS — I4891 Unspecified atrial fibrillation: Secondary | ICD-10-CM | POA: Diagnosis not present

## 2019-05-26 DIAGNOSIS — M8440XA Pathological fracture, unspecified site, initial encounter for fracture: Secondary | ICD-10-CM | POA: Diagnosis not present

## 2019-05-26 DIAGNOSIS — Z23 Encounter for immunization: Secondary | ICD-10-CM | POA: Diagnosis not present

## 2019-05-26 DIAGNOSIS — E559 Vitamin D deficiency, unspecified: Secondary | ICD-10-CM | POA: Diagnosis not present

## 2019-05-26 DIAGNOSIS — S51811A Laceration without foreign body of right forearm, initial encounter: Secondary | ICD-10-CM | POA: Diagnosis not present

## 2019-05-26 DIAGNOSIS — J9811 Atelectasis: Secondary | ICD-10-CM | POA: Diagnosis not present

## 2019-05-26 DIAGNOSIS — D649 Anemia, unspecified: Secondary | ICD-10-CM | POA: Diagnosis not present

## 2019-05-26 DIAGNOSIS — E860 Dehydration: Secondary | ICD-10-CM | POA: Diagnosis not present

## 2019-05-27 DIAGNOSIS — R531 Weakness: Secondary | ICD-10-CM

## 2019-06-01 DIAGNOSIS — R531 Weakness: Secondary | ICD-10-CM | POA: Diagnosis not present

## 2019-06-01 DIAGNOSIS — Z7901 Long term (current) use of anticoagulants: Secondary | ICD-10-CM | POA: Diagnosis not present

## 2019-06-01 DIAGNOSIS — E890 Postprocedural hypothyroidism: Secondary | ICD-10-CM | POA: Diagnosis not present

## 2019-06-01 DIAGNOSIS — S51811D Laceration without foreign body of right forearm, subsequent encounter: Secondary | ICD-10-CM | POA: Diagnosis not present

## 2019-06-01 DIAGNOSIS — I420 Dilated cardiomyopathy: Secondary | ICD-10-CM | POA: Diagnosis not present

## 2019-06-01 DIAGNOSIS — R2689 Other abnormalities of gait and mobility: Secondary | ICD-10-CM | POA: Diagnosis not present

## 2019-06-01 DIAGNOSIS — I482 Chronic atrial fibrillation, unspecified: Secondary | ICD-10-CM | POA: Diagnosis not present

## 2019-06-01 DIAGNOSIS — S02609A Fracture of mandible, unspecified, initial encounter for closed fracture: Secondary | ICD-10-CM | POA: Diagnosis not present

## 2019-06-02 DIAGNOSIS — I252 Old myocardial infarction: Secondary | ICD-10-CM | POA: Diagnosis not present

## 2019-06-02 DIAGNOSIS — E559 Vitamin D deficiency, unspecified: Secondary | ICD-10-CM | POA: Diagnosis not present

## 2019-06-02 DIAGNOSIS — D649 Anemia, unspecified: Secondary | ICD-10-CM | POA: Diagnosis not present

## 2019-06-02 DIAGNOSIS — Z7901 Long term (current) use of anticoagulants: Secondary | ICD-10-CM | POA: Diagnosis not present

## 2019-06-02 DIAGNOSIS — E876 Hypokalemia: Secondary | ICD-10-CM | POA: Diagnosis not present

## 2019-06-02 DIAGNOSIS — J45909 Unspecified asthma, uncomplicated: Secondary | ICD-10-CM | POA: Diagnosis not present

## 2019-06-02 DIAGNOSIS — R296 Repeated falls: Secondary | ICD-10-CM | POA: Diagnosis not present

## 2019-06-02 DIAGNOSIS — E039 Hypothyroidism, unspecified: Secondary | ICD-10-CM | POA: Diagnosis not present

## 2019-06-02 DIAGNOSIS — J9601 Acute respiratory failure with hypoxia: Secondary | ICD-10-CM | POA: Diagnosis not present

## 2019-06-02 DIAGNOSIS — Z95810 Presence of automatic (implantable) cardiac defibrillator: Secondary | ICD-10-CM | POA: Diagnosis not present

## 2019-06-02 DIAGNOSIS — I4891 Unspecified atrial fibrillation: Secondary | ICD-10-CM | POA: Diagnosis not present

## 2019-06-02 DIAGNOSIS — M8440XD Pathological fracture, unspecified site, subsequent encounter for fracture with routine healing: Secondary | ICD-10-CM | POA: Diagnosis not present

## 2019-06-04 ENCOUNTER — Ambulatory Visit (INDEPENDENT_AMBULATORY_CARE_PROVIDER_SITE_OTHER): Payer: PPO | Admitting: *Deleted

## 2019-06-04 DIAGNOSIS — I428 Other cardiomyopathies: Secondary | ICD-10-CM

## 2019-06-04 LAB — CUP PACEART REMOTE DEVICE CHECK
Battery Remaining Longevity: 33 mo
Battery Voltage: 2.95 V
Brady Statistic AP VP Percent: 92.27 %
Brady Statistic AP VS Percent: 0.24 %
Brady Statistic AS VP Percent: 7.39 %
Brady Statistic AS VS Percent: 0.1 %
Brady Statistic RA Percent Paced: 92 %
Brady Statistic RV Percent Paced: 99.15 %
Date Time Interrogation Session: 20200622113625
HighPow Impedance: 71 Ohm
HighPow Impedance: 79 Ohm
Implantable Lead Implant Date: 20080805
Implantable Lead Implant Date: 20080805
Implantable Lead Implant Date: 20180816
Implantable Lead Location: 753858
Implantable Lead Location: 753859
Implantable Lead Location: 753860
Implantable Lead Model: 4076
Implantable Lead Model: 7001
Implantable Pulse Generator Implant Date: 20180816
Lead Channel Impedance Value: 1026 Ohm
Lead Channel Impedance Value: 1064 Ohm
Lead Channel Impedance Value: 246.635
Lead Channel Impedance Value: 250.943
Lead Channel Impedance Value: 261.164
Lead Channel Impedance Value: 277.083
Lead Channel Impedance Value: 289.597
Lead Channel Impedance Value: 342 Ohm
Lead Channel Impedance Value: 399 Ohm
Lead Channel Impedance Value: 475 Ohm
Lead Channel Impedance Value: 513 Ohm
Lead Channel Impedance Value: 532 Ohm
Lead Channel Impedance Value: 551 Ohm
Lead Channel Impedance Value: 665 Ohm
Lead Channel Impedance Value: 836 Ohm
Lead Channel Impedance Value: 836 Ohm
Lead Channel Impedance Value: 836 Ohm
Lead Channel Impedance Value: 931 Ohm
Lead Channel Pacing Threshold Amplitude: 0.375 V
Lead Channel Pacing Threshold Amplitude: 1.25 V
Lead Channel Pacing Threshold Amplitude: 1.375 V
Lead Channel Pacing Threshold Pulse Width: 0.4 ms
Lead Channel Pacing Threshold Pulse Width: 0.4 ms
Lead Channel Pacing Threshold Pulse Width: 0.8 ms
Lead Channel Sensing Intrinsic Amplitude: 2 mV
Lead Channel Sensing Intrinsic Amplitude: 2 mV
Lead Channel Sensing Intrinsic Amplitude: 6.25 mV
Lead Channel Sensing Intrinsic Amplitude: 6.25 mV
Lead Channel Setting Pacing Amplitude: 1.5 V
Lead Channel Setting Pacing Amplitude: 3.5 V
Lead Channel Setting Pacing Amplitude: 4.25 V
Lead Channel Setting Pacing Pulse Width: 0.4 ms
Lead Channel Setting Pacing Pulse Width: 0.8 ms
Lead Channel Setting Sensing Sensitivity: 1.2 mV

## 2019-06-05 ENCOUNTER — Ambulatory Visit (INDEPENDENT_AMBULATORY_CARE_PROVIDER_SITE_OTHER): Payer: PPO

## 2019-06-05 DIAGNOSIS — I5022 Chronic systolic (congestive) heart failure: Secondary | ICD-10-CM | POA: Diagnosis not present

## 2019-06-05 DIAGNOSIS — Z95810 Presence of automatic (implantable) cardiac defibrillator: Secondary | ICD-10-CM

## 2019-06-05 DIAGNOSIS — C9000 Multiple myeloma not having achieved remission: Secondary | ICD-10-CM | POA: Diagnosis not present

## 2019-06-05 NOTE — Progress Notes (Signed)
Call to son, Jasdeep Kepner listed on DPR.  Provided intro.  He reviewed events of last 2 weeks.  He said they are taking him to cancer doctor today to check for multiple myeloma.  Son take patient to PCP prior fall due back pain and pt started on Prednisone daily to help with pain but unsure what caused the back pain.  The fx jaw was dx at the hospital but was not related to the fall.  Coumadin level in the hospital was 10 but is back to normal now.  Patient was given a lot of IV fluids due to he was dehydrated.  Discussed monitoring for fluid accumulation symptoms as well as limiting salt intake to 2000 mg daily and fluid intake to 64 oz a day.  Severe weakness is an ongoing issue and the cause is still unknown which is another reason they will do some testing for multiple myeloma.   He reported the PCP has some of latest labs and [facility] also did lab work as well.   Patients last visit with Dr [name] was 08/2018. Son will call back with any updates or changes. Advised to call 911 if patient experiences any fluid symptoms that were reviewed during conversation.

## 2019-06-05 NOTE — Progress Notes (Signed)
Call to patient.  Advised to continue with prescribed Furosemide at this time and do not take any extra.  Will continue to monitor fluid levels and advised to limit salt intake.   Will recheck fluid levels on 06/11/2019.   He will having procedure on his hip Friday to check for multiple myeloma.

## 2019-06-05 NOTE — Progress Notes (Signed)
Discussed with Dr [name] and will not increase Furosemide at this time due to patient extreme weakness.  Will continue to monitor and try to obtain latest lab results.

## 2019-06-05 NOTE — Progress Notes (Signed)
Attempted call to PCP, Dr Venetia Maxon.  Left message for his nurse to fax latest lab results and provide fax and phone number.

## 2019-06-05 NOTE — Progress Notes (Signed)
EPIC Encounter for ICM Monitoring  Patient Name: Cody Gomez is a 85 y.o. male Date: 06/05/2019 Primary Care Physican: Street, Sharon Mt, MD Primary [place] Electrophysiologist: [name] BiV Pacing: 99.2%Effective: 97.3% LastWeight: 199-201lbs   Clinical Status (23-Apr-2019 to 04-Jun-2019)  AT/AF   8 episodes  Time in AT/AF  <0.1 hr/day (0.3%)  Observations (2) (23-Apr-2019 to 04-Jun-2019)  Possible OptiVol fluid accumulation: 30-May-2019 -- ongoing.  Patient Activity less than 1 hr/day for 5 weeks.  Longest AT/AF   105 minutes  Event Summary ?Possible Fluid Accumulation ?Low Patient Activity ?26 V. Sensing Episodes ?82 VT-NS ?3 hours in AT/AF Since Last Session    Heart failure questions reviewed and asymptomatic for fluid symptoms. He fell at home, due to onset of sudden weakness 2 weeks ago and hospitalized in [place] after 2nd fall and discharged 6/17.  He complains of back pain with possible fx rib that may be related to the fall.   He also has a fx jaw as well but it is healing.  He said his son is taking him to cancer doctor today for check up.  Advised I would call his son to let him know about the fluid accumulation so they can monitor for changes and he said that would be fine.  He has a lot of family checking on him and staying with him at night.   Optivol Thoracic impedance abnormal suggesting possible fluid accumulation since 6/13 and is ongoing.  Impedance has improved in the last 2 days.   Prescribed:Furosemide 40 mg every day. Potassium 20 mEqtake 1 tablet by mouth twice daily.  Labs:  08/31/2018 Creatinine 1.01, BUN 18, Potassium 4.3, Sodium 138, GFR 68-78 03/03/2018 Creatinine 0.90, BUN 16, Potassium 3.9, Sodium 139, GFR 78-90  Recommendations: Will discuss with Dr [name] for recommendations if needed.  Follow-up plan: ICM clinic phone appointment on6/29/2020.   Copy of ICM check sent to [place].     3  month ICM trend: 06/04/2019    1 Year ICM trend:       Rosalene Billings, RN 06/05/2019 11:15 AM

## 2019-06-06 DIAGNOSIS — R2689 Other abnormalities of gait and mobility: Secondary | ICD-10-CM | POA: Diagnosis not present

## 2019-06-08 ENCOUNTER — Other Ambulatory Visit (HOSPITAL_COMMUNITY)
Admission: RE | Admit: 2019-06-08 | Disposition: A | Discharge status: Home / Self Care | Payer: PPO | Source: Ambulatory Visit | Attending: Oncology | Admitting: Oncology

## 2019-06-08 DIAGNOSIS — D649 Anemia, unspecified: Secondary | ICD-10-CM | POA: Diagnosis not present

## 2019-06-08 DIAGNOSIS — D472 Monoclonal gammopathy: Secondary | ICD-10-CM | POA: Diagnosis not present

## 2019-06-08 DIAGNOSIS — C9000 Multiple myeloma not having achieved remission: Secondary | ICD-10-CM | POA: Diagnosis not present

## 2019-06-08 DIAGNOSIS — D72822 Plasmacytosis: Secondary | ICD-10-CM | POA: Diagnosis not present

## 2019-06-11 ENCOUNTER — Ambulatory Visit (INDEPENDENT_AMBULATORY_CARE_PROVIDER_SITE_OTHER): Payer: PPO

## 2019-06-11 DIAGNOSIS — E890 Postprocedural hypothyroidism: Secondary | ICD-10-CM | POA: Diagnosis not present

## 2019-06-11 DIAGNOSIS — R791 Abnormal coagulation profile: Secondary | ICD-10-CM | POA: Diagnosis not present

## 2019-06-11 DIAGNOSIS — Z8546 Personal history of malignant neoplasm of prostate: Secondary | ICD-10-CM | POA: Diagnosis not present

## 2019-06-11 DIAGNOSIS — R918 Other nonspecific abnormal finding of lung field: Secondary | ICD-10-CM | POA: Diagnosis not present

## 2019-06-11 DIAGNOSIS — I5022 Chronic systolic (congestive) heart failure: Secondary | ICD-10-CM

## 2019-06-11 DIAGNOSIS — J9601 Acute respiratory failure with hypoxia: Secondary | ICD-10-CM | POA: Diagnosis not present

## 2019-06-11 DIAGNOSIS — Z85118 Personal history of other malignant neoplasm of bronchus and lung: Secondary | ICD-10-CM | POA: Diagnosis not present

## 2019-06-11 DIAGNOSIS — I252 Old myocardial infarction: Secondary | ICD-10-CM | POA: Diagnosis not present

## 2019-06-11 DIAGNOSIS — G9341 Metabolic encephalopathy: Secondary | ICD-10-CM | POA: Diagnosis not present

## 2019-06-11 DIAGNOSIS — I4891 Unspecified atrial fibrillation: Secondary | ICD-10-CM | POA: Diagnosis not present

## 2019-06-11 DIAGNOSIS — R531 Weakness: Secondary | ICD-10-CM | POA: Diagnosis not present

## 2019-06-11 DIAGNOSIS — Z743 Need for continuous supervision: Secondary | ICD-10-CM | POA: Diagnosis not present

## 2019-06-11 DIAGNOSIS — E860 Dehydration: Secondary | ICD-10-CM | POA: Diagnosis not present

## 2019-06-11 DIAGNOSIS — Z79899 Other long term (current) drug therapy: Secondary | ICD-10-CM | POA: Diagnosis not present

## 2019-06-11 DIAGNOSIS — Z95810 Presence of automatic (implantable) cardiac defibrillator: Secondary | ICD-10-CM

## 2019-06-11 DIAGNOSIS — Z7901 Long term (current) use of anticoagulants: Secondary | ICD-10-CM | POA: Diagnosis not present

## 2019-06-11 DIAGNOSIS — Z888 Allergy status to other drugs, medicaments and biological substances status: Secondary | ICD-10-CM | POA: Diagnosis not present

## 2019-06-11 DIAGNOSIS — R404 Transient alteration of awareness: Secondary | ICD-10-CM | POA: Diagnosis not present

## 2019-06-11 DIAGNOSIS — R0902 Hypoxemia: Secondary | ICD-10-CM | POA: Diagnosis not present

## 2019-06-11 DIAGNOSIS — I509 Heart failure, unspecified: Secondary | ICD-10-CM | POA: Diagnosis not present

## 2019-06-11 DIAGNOSIS — C9000 Multiple myeloma not having achieved remission: Secondary | ICD-10-CM | POA: Diagnosis not present

## 2019-06-11 DIAGNOSIS — R0989 Other specified symptoms and signs involving the circulatory and respiratory systems: Secondary | ICD-10-CM | POA: Diagnosis not present

## 2019-06-11 DIAGNOSIS — R410 Disorientation, unspecified: Secondary | ICD-10-CM | POA: Diagnosis not present

## 2019-06-11 DIAGNOSIS — I959 Hypotension, unspecified: Secondary | ICD-10-CM | POA: Diagnosis not present

## 2019-06-11 DIAGNOSIS — I2510 Atherosclerotic heart disease of native coronary artery without angina pectoris: Secondary | ICD-10-CM | POA: Diagnosis not present

## 2019-06-11 DIAGNOSIS — D689 Coagulation defect, unspecified: Secondary | ICD-10-CM | POA: Diagnosis not present

## 2019-06-11 DIAGNOSIS — Z880 Allergy status to penicillin: Secondary | ICD-10-CM | POA: Diagnosis not present

## 2019-06-11 DIAGNOSIS — J45909 Unspecified asthma, uncomplicated: Secondary | ICD-10-CM | POA: Diagnosis not present

## 2019-06-11 DIAGNOSIS — Z87891 Personal history of nicotine dependence: Secondary | ICD-10-CM | POA: Diagnosis not present

## 2019-06-12 NOTE — Progress Notes (Signed)
EPIC Encounter for ICM Monitoring  Patient Name: Cody Gomez is a 85 y.o. male Date: 06/12/2019 Primary Care Physican: Street, Sharon Mt, MD Primary [place] Electrophysiologist: [name] BiV Pacing: 99.2%Effective: 97.3% LastWeight: 199-201lbs   Received voice mail message from son Kal Chait listed on DPR.  Patient is currently in [place] and condition has deteriorated rapidly.  Recent test was positive for multiple myeloma.  He said patient will be transferred to hospice for care and is not expected to recover.  He was appreciative of the help given to his father and just wanted to let me know.    OptivolThoracic impedance returned to normal.  Prescribed:Furosemide 40 mg every day. Potassium 20 mEqtake 1 tablet by mouth twice daily.  Labs:  08/31/2018 Creatinine 1.01, BUN 18, Potassium 4.3, Sodium 138, GFR 68-78 03/03/2018 Creatinine 0.90, BUN 16, Potassium 3.9, Sodium 139, GFR 78-90  Recommendations: No further follow up needed since patient is being transferred to hospice since prognosis is poor.   Message sent to Dr [name] with update.  Follow-up plan: None  Copy of ICM check sent to [place].   3 month ICM trend: 06/11/2019    1 Year ICM trend:       Rosalene Billings, RN 06/12/2019 3:42 PM

## 2019-06-13 ENCOUNTER — Telehealth: Payer: Self-pay

## 2019-06-13 DIAGNOSIS — J9601 Acute respiratory failure with hypoxia: Secondary | ICD-10-CM | POA: Diagnosis not present

## 2019-06-13 DIAGNOSIS — C9000 Multiple myeloma not having achieved remission: Secondary | ICD-10-CM

## 2019-06-13 NOTE — Progress Notes (Signed)
Remote ICD transmission.   

## 2019-06-13 NOTE — Telephone Encounter (Signed)
E Thanks sk

## 2019-06-13 NOTE — Telephone Encounter (Signed)
Hillary of [facility]. Pt family would like for the pt ICD to be turned off. I did tell her that we would need a Doctor request to turn off the ICD. I let her talk to device tech nurse Raquel Sarna.

## 2019-06-13 NOTE — Telephone Encounter (Signed)
Spoke with Ramiro Harvest, RN, with [facility]. She confirms that hospice MD will write order for ICD deactivation. Advised pt has a [employer] device. Gave [employer] contact number [phone]), explained will need to page a local rep, then setup appointment for reprogramming. Hillary aware to apply magnet if pt's death is imminent.  Requested copy of order faxed to [place] for records, Hillary verbalizes understanding, gave fax number. Routed to Dr. [name] as Juluis Rainier.

## 2019-06-18 ENCOUNTER — Encounter (HOSPITAL_COMMUNITY): Payer: Self-pay | Admitting: Oncology

## 2019-06-18 NOTE — Telephone Encounter (Signed)
Per his obituary, patient expired on July 11, 2019. Unenrolled from [place], return kit not ordered at this time.

## 2019-06-21 DIAGNOSIS — J9601 Acute respiratory failure with hypoxia: Secondary | ICD-10-CM | POA: Diagnosis not present

## 2019-06-28 ENCOUNTER — Other Ambulatory Visit: Payer: Self-pay | Admitting: Internal Medicine

## 2019-07-14 DEATH — deceased
